# Patient Record
Sex: Female | Born: 1950
Health system: Southern US, Community
[De-identification: ages and names within clinical notes are randomized; demographics above are authoritative.]

## PROBLEM LIST (undated history)

## (undated) DIAGNOSIS — K5090 Crohn's disease, unspecified, without complications: Secondary | ICD-10-CM

## (undated) DIAGNOSIS — Z87442 Personal history of urinary calculi: Secondary | ICD-10-CM

## (undated) DIAGNOSIS — C801 Malignant (primary) neoplasm, unspecified: Secondary | ICD-10-CM

## (undated) DIAGNOSIS — R0683 Snoring: Secondary | ICD-10-CM

## (undated) DIAGNOSIS — M1990 Unspecified osteoarthritis, unspecified site: Secondary | ICD-10-CM

## (undated) DIAGNOSIS — K219 Gastro-esophageal reflux disease without esophagitis: Secondary | ICD-10-CM

## (undated) DIAGNOSIS — N951 Menopausal and female climacteric states: Secondary | ICD-10-CM

## (undated) DIAGNOSIS — F32A Depression, unspecified: Secondary | ICD-10-CM

## (undated) DIAGNOSIS — D649 Anemia, unspecified: Secondary | ICD-10-CM

## (undated) DIAGNOSIS — I10 Essential (primary) hypertension: Secondary | ICD-10-CM

## (undated) DIAGNOSIS — R6889 Other general symptoms and signs: Secondary | ICD-10-CM

## (undated) DIAGNOSIS — N200 Calculus of kidney: Secondary | ICD-10-CM

## (undated) DIAGNOSIS — Z9189 Other specified personal risk factors, not elsewhere classified: Secondary | ICD-10-CM

## (undated) DIAGNOSIS — F329 Major depressive disorder, single episode, unspecified: Secondary | ICD-10-CM

## (undated) DIAGNOSIS — K56609 Unspecified intestinal obstruction, unspecified as to partial versus complete obstruction: Secondary | ICD-10-CM

## (undated) DIAGNOSIS — E785 Hyperlipidemia, unspecified: Secondary | ICD-10-CM

## (undated) DIAGNOSIS — E119 Type 2 diabetes mellitus without complications: Secondary | ICD-10-CM

## (undated) HISTORY — PX: NASAL SINUS SURGERY: SHX719

## (undated) HISTORY — DX: Major depressive disorder, single episode, unspecified: F32.9

## (undated) HISTORY — DX: Unspecified intestinal obstruction, unspecified as to partial versus complete obstruction: K56.609

## (undated) HISTORY — DX: Other specified personal risk factors, not elsewhere classified: Z91.89

## (undated) HISTORY — PX: BREAST BIOPSY: SHX20

## (undated) HISTORY — PX: BREAST SURGERY: SHX581

## (undated) HISTORY — DX: Depression, unspecified: F32.A

## (undated) HISTORY — PX: KNEE SURGERY: SHX244

## (undated) HISTORY — PX: OTHER SURGICAL HISTORY: SHX169

## (undated) HISTORY — DX: Menopausal and female climacteric states: N95.1

## (undated) HISTORY — DX: Hyperlipidemia, unspecified: E78.5

## (undated) HISTORY — DX: Snoring: R06.83

## (undated) HISTORY — DX: Calculus of kidney: N20.0

## (undated) HISTORY — DX: Other general symptoms and signs: R68.89

## (undated) HISTORY — PX: APPENDECTOMY: SHX54

## (undated) HISTORY — DX: Crohn's disease, unspecified, without complications: K50.90

## (undated) HISTORY — PX: BOWEL RESECTION: SHX1257

## (undated) HISTORY — PX: TONGUE BASE REDUCTION SOMNOPLASTY: SHX2535

## (undated) HISTORY — PX: HAND SURGERY: SHX662

## (undated) HISTORY — PX: KIDNEY SURGERY: SHX687

## (undated) HISTORY — DX: Type 2 diabetes mellitus without complications: E11.9

## (undated) HISTORY — PX: DILATION AND CURETTAGE OF UTERUS: SHX78

## (undated) HISTORY — PX: CERVICAL FUSION: SHX112

## (undated) HISTORY — PX: TONSILLECTOMY AND ADENOIDECTOMY: SUR1326

---
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
## 1968-12-12 HISTORY — PX: APPENDECTOMY: SHX54

## 1969-12-12 HISTORY — PX: ILEOCECETOMY: SHX5857

## 1972-12-12 HISTORY — PX: WISDOM TOOTH EXTRACTION: SHX21

## 1982-12-12 HISTORY — PX: DILATION AND CURETTAGE OF UTERUS: SHX78

## 1983-12-13 HISTORY — PX: SMALL INTESTINE SURGERY: SHX150

## 1990-12-12 HISTORY — PX: KIDNEY SURGERY: SHX687

## 1993-12-12 HISTORY — PX: NASAL SINUS SURGERY: SHX719

## 1994-12-12 HISTORY — PX: SMALL INTESTINE SURGERY: SHX150

## 1996-12-12 HISTORY — PX: KNEE SURGERY: SHX244

## 1998-12-12 HISTORY — PX: TONGUE BASE REDUCTION SOMNOPLASTY: SHX2535

## 1999-01-20 ENCOUNTER — Ambulatory Visit (HOSPITAL_COMMUNITY): Admission: RE | Admit: 1999-01-20 | Discharge: 1999-01-20 | Payer: Self-pay | Admitting: Gastroenterology

## 1999-02-15 ENCOUNTER — Ambulatory Visit (HOSPITAL_COMMUNITY): Admission: RE | Admit: 1999-02-15 | Discharge: 1999-02-15 | Payer: Self-pay | Admitting: Gastroenterology

## 1999-02-15 ENCOUNTER — Encounter: Payer: Self-pay | Admitting: Gastroenterology

## 2000-02-10 ENCOUNTER — Other Ambulatory Visit: Admission: RE | Admit: 2000-02-10 | Discharge: 2000-02-10 | Payer: Self-pay | Admitting: Obstetrics and Gynecology

## 2000-06-30 ENCOUNTER — Ambulatory Visit (HOSPITAL_COMMUNITY): Admission: RE | Admit: 2000-06-30 | Discharge: 2000-06-30 | Payer: Self-pay | Admitting: General Surgery

## 2000-06-30 ENCOUNTER — Encounter (INDEPENDENT_AMBULATORY_CARE_PROVIDER_SITE_OTHER): Payer: Self-pay | Admitting: Specialist

## 2000-10-04 ENCOUNTER — Ambulatory Visit (HOSPITAL_COMMUNITY): Admission: RE | Admit: 2000-10-04 | Discharge: 2000-10-04 | Payer: Self-pay | Admitting: Gastroenterology

## 2000-10-04 ENCOUNTER — Encounter (INDEPENDENT_AMBULATORY_CARE_PROVIDER_SITE_OTHER): Payer: Self-pay | Admitting: Specialist

## 2001-03-14 ENCOUNTER — Other Ambulatory Visit: Admission: RE | Admit: 2001-03-14 | Discharge: 2001-03-14 | Payer: Self-pay | Admitting: Obstetrics and Gynecology

## 2002-03-25 ENCOUNTER — Other Ambulatory Visit: Admission: RE | Admit: 2002-03-25 | Discharge: 2002-03-25 | Payer: Self-pay | Admitting: Obstetrics and Gynecology

## 2002-12-12 HISTORY — PX: LIPOSUCTION: SHX10

## 2003-04-23 ENCOUNTER — Other Ambulatory Visit: Admission: RE | Admit: 2003-04-23 | Discharge: 2003-04-23 | Payer: Self-pay | Admitting: Obstetrics and Gynecology

## 2004-05-17 ENCOUNTER — Other Ambulatory Visit: Admission: RE | Admit: 2004-05-17 | Discharge: 2004-05-17 | Payer: Self-pay | Admitting: Obstetrics and Gynecology

## 2005-02-17 ENCOUNTER — Encounter: Admission: RE | Admit: 2005-02-17 | Discharge: 2005-02-17 | Payer: Self-pay | Admitting: Obstetrics and Gynecology

## 2005-07-19 ENCOUNTER — Other Ambulatory Visit: Admission: RE | Admit: 2005-07-19 | Discharge: 2005-07-19 | Payer: Self-pay | Admitting: Obstetrics and Gynecology

## 2005-07-21 ENCOUNTER — Encounter: Admission: RE | Admit: 2005-07-21 | Discharge: 2005-07-21 | Payer: Self-pay | Admitting: Obstetrics and Gynecology

## 2005-10-04 ENCOUNTER — Ambulatory Visit (HOSPITAL_COMMUNITY): Admission: RE | Admit: 2005-10-04 | Discharge: 2005-10-04 | Payer: Self-pay | Admitting: Gastroenterology

## 2006-01-30 ENCOUNTER — Ambulatory Visit (HOSPITAL_COMMUNITY): Admission: RE | Admit: 2006-01-30 | Discharge: 2006-01-30 | Payer: Self-pay | Admitting: Dermatology

## 2006-07-14 ENCOUNTER — Encounter: Admission: RE | Admit: 2006-07-14 | Discharge: 2006-07-14 | Payer: Self-pay | Admitting: Obstetrics and Gynecology

## 2006-08-04 ENCOUNTER — Encounter: Admission: RE | Admit: 2006-08-04 | Discharge: 2006-08-04 | Payer: Self-pay | Admitting: Obstetrics and Gynecology

## 2006-08-24 ENCOUNTER — Ambulatory Visit (HOSPITAL_BASED_OUTPATIENT_CLINIC_OR_DEPARTMENT_OTHER): Admission: RE | Admit: 2006-08-24 | Discharge: 2006-08-24 | Payer: Self-pay | Admitting: Surgery

## 2006-08-24 ENCOUNTER — Encounter (INDEPENDENT_AMBULATORY_CARE_PROVIDER_SITE_OTHER): Payer: Self-pay | Admitting: *Deleted

## 2006-08-31 ENCOUNTER — Other Ambulatory Visit: Admission: RE | Admit: 2006-08-31 | Discharge: 2006-08-31 | Payer: Self-pay | Admitting: Obstetrics and Gynecology

## 2006-10-06 ENCOUNTER — Encounter: Admission: RE | Admit: 2006-10-06 | Discharge: 2006-10-06 | Payer: Self-pay | Admitting: Obstetrics and Gynecology

## 2006-10-13 ENCOUNTER — Encounter: Admission: RE | Admit: 2006-10-13 | Discharge: 2006-10-13 | Payer: Self-pay | Admitting: Obstetrics and Gynecology

## 2007-12-07 ENCOUNTER — Other Ambulatory Visit: Admission: RE | Admit: 2007-12-07 | Discharge: 2007-12-07 | Payer: Self-pay | Admitting: Obstetrics and Gynecology

## 2007-12-19 ENCOUNTER — Encounter: Admission: RE | Admit: 2007-12-19 | Discharge: 2007-12-19 | Payer: Self-pay | Admitting: Obstetrics and Gynecology

## 2009-01-02 ENCOUNTER — Encounter: Admission: RE | Admit: 2009-01-02 | Discharge: 2009-01-02 | Payer: Self-pay | Admitting: Obstetrics and Gynecology

## 2009-01-08 ENCOUNTER — Ambulatory Visit: Payer: Self-pay | Admitting: Obstetrics and Gynecology

## 2009-01-08 ENCOUNTER — Other Ambulatory Visit: Admission: RE | Admit: 2009-01-08 | Discharge: 2009-01-08 | Payer: Self-pay | Admitting: Obstetrics and Gynecology

## 2009-01-08 ENCOUNTER — Encounter: Payer: Self-pay | Admitting: Obstetrics and Gynecology

## 2009-05-12 HISTORY — PX: CERVICAL FUSION: SHX112

## 2009-05-14 ENCOUNTER — Ambulatory Visit (HOSPITAL_COMMUNITY): Admission: RE | Admit: 2009-05-14 | Discharge: 2009-05-15 | Payer: Self-pay | Admitting: Neurosurgery

## 2009-06-18 ENCOUNTER — Encounter: Admission: RE | Admit: 2009-06-18 | Discharge: 2009-06-18 | Payer: Self-pay | Admitting: Internal Medicine

## 2010-01-04 ENCOUNTER — Encounter: Admission: RE | Admit: 2010-01-04 | Discharge: 2010-01-04 | Payer: Self-pay | Admitting: Obstetrics and Gynecology

## 2010-01-08 ENCOUNTER — Encounter: Admission: RE | Admit: 2010-01-08 | Discharge: 2010-01-08 | Payer: Self-pay | Admitting: Obstetrics and Gynecology

## 2010-01-11 ENCOUNTER — Ambulatory Visit: Payer: Self-pay | Admitting: Obstetrics and Gynecology

## 2010-01-11 ENCOUNTER — Other Ambulatory Visit: Admission: RE | Admit: 2010-01-11 | Discharge: 2010-01-11 | Payer: Self-pay | Admitting: Obstetrics and Gynecology

## 2010-03-02 ENCOUNTER — Encounter: Admission: RE | Admit: 2010-03-02 | Discharge: 2010-03-02 | Payer: Self-pay | Admitting: Internal Medicine

## 2010-03-17 ENCOUNTER — Ambulatory Visit: Payer: Self-pay | Admitting: Obstetrics and Gynecology

## 2011-01-02 ENCOUNTER — Encounter (HOSPITAL_BASED_OUTPATIENT_CLINIC_OR_DEPARTMENT_OTHER): Payer: Self-pay | Admitting: Internal Medicine

## 2011-01-02 ENCOUNTER — Encounter: Payer: Self-pay | Admitting: Obstetrics and Gynecology

## 2011-01-12 ENCOUNTER — Other Ambulatory Visit: Payer: Self-pay | Admitting: Obstetrics and Gynecology

## 2011-01-12 DIAGNOSIS — Z1231 Encounter for screening mammogram for malignant neoplasm of breast: Secondary | ICD-10-CM

## 2011-01-12 DIAGNOSIS — Z1239 Encounter for other screening for malignant neoplasm of breast: Secondary | ICD-10-CM

## 2011-01-21 ENCOUNTER — Ambulatory Visit
Admission: RE | Admit: 2011-01-21 | Discharge: 2011-01-21 | Disposition: A | Discharge status: Home / Self Care | Payer: BC Managed Care – PPO | Source: Ambulatory Visit | Attending: Obstetrics and Gynecology | Admitting: Obstetrics and Gynecology

## 2011-01-21 DIAGNOSIS — Z1231 Encounter for screening mammogram for malignant neoplasm of breast: Secondary | ICD-10-CM

## 2011-02-10 ENCOUNTER — Other Ambulatory Visit: Payer: Self-pay | Admitting: Obstetrics and Gynecology

## 2011-02-10 ENCOUNTER — Encounter (INDEPENDENT_AMBULATORY_CARE_PROVIDER_SITE_OTHER): Payer: BC Managed Care – PPO | Admitting: Obstetrics and Gynecology

## 2011-02-10 ENCOUNTER — Other Ambulatory Visit (HOSPITAL_COMMUNITY)
Admission: RE | Admit: 2011-02-10 | Discharge: 2011-02-10 | Disposition: A | Discharge status: Home / Self Care | Payer: BC Managed Care – PPO | Source: Ambulatory Visit | Attending: Obstetrics and Gynecology | Admitting: Obstetrics and Gynecology

## 2011-02-10 DIAGNOSIS — Z01419 Encounter for gynecological examination (general) (routine) without abnormal findings: Secondary | ICD-10-CM

## 2011-02-10 DIAGNOSIS — Z124 Encounter for screening for malignant neoplasm of cervix: Secondary | ICD-10-CM | POA: Insufficient documentation

## 2011-03-22 LAB — BASIC METABOLIC PANEL
BUN: 9 mg/dL (ref 6–23)
CO2: 31 mEq/L (ref 19–32)
Calcium: 9.3 mg/dL (ref 8.4–10.5)
Chloride: 99 mEq/L (ref 96–112)
Creatinine, Ser: 1.1 mg/dL (ref 0.4–1.2)
GFR calc Af Amer: >60 mL/min (ref >60)
GFR calc non Af Amer: 51 mL/min — ABNORMAL LOW (ref >60)
Glucose, Bld: 117 mg/dL — ABNORMAL HIGH (ref 70–99)
Potassium: 3.6 mEq/L (ref 3.5–5.1)
Sodium: 139 mEq/L (ref 135–145)

## 2011-03-22 LAB — CBC
HCT: 40 % (ref 36.0–46.0)
Hemoglobin: 13.9 g/dL (ref 12.0–15.0)
MCHC: 34.6 g/dL (ref 30.0–36.0)
MCV: 88.6 fL (ref 78.0–100.0)
Platelets: 343 10*3/uL (ref 150–400)
RBC: 4.52 MIL/uL (ref 3.87–5.11)
RDW: 13.9 % (ref 11.5–15.5)
WBC: 10.9 10*3/uL — ABNORMAL HIGH (ref 4.0–10.5)

## 2011-04-26 NOTE — Op Note (Signed)
NAMEKAIRA, STRINGFIELD NO.:  [id number]   MEDICAL RECORD NO.:  [id number]          PATIENT TYPE:  OIB   LOCATION:  4696                         FACILITY:  [facility]   PHYSICIAN:  Hosie Spangle, M.D.DATE OF BIRTH:  12-26-1950   DATE OF PROCEDURE:  05/14/2009  DATE OF DISCHARGE:                               OPERATIVE REPORT   PREOPERATIVE DIAGNOSES:  1. Cervical spondylosis.  2. Cervical degenerative disk disease.  3. Cervical disk herniation.  4. Cervical radiculopathy.   POSTOPERATIVE DIAGNOSES:  1. Cervical spondylosis.  2. Cervical degenerative disk disease.  3. Cervical disk herniation.  4. Cervical radiculopathy.   PROCEDURE:  C4-5, C5-6, and C6-7 anterior cervical decompression and  arthrodesis with allograft and Tether cervical plating.   SURGEON:  Hosie Spangle, M.D.   ASSISTANT:  Isa Rankin. Judeth Horn, RN and Elizabeth Sauer, M.D.   ANESTHESIA:  General endotracheal.   INDICATIONS:  The patient with right cervical radicular pain.  She has  advanced degenerative disk disease and spondylosis at C4-5, C5-6, and C6-  7 with osteophytic overgrowth and spondylitic disk protrusion.  A  decision was made to proceed with 3-level decompression and arthrodesis.   PROCEDURE IN DETAIL:  The patient was brought to the operating room and  placed under general endotracheal anesthesia.  The patient was placed in  10 pounds of Halter traction.  Neck was prepped with Betadine soap and  solution and draped in a sterile fashion.  An oblique incision was made  in the left side of the neck.  The line of the incision was infiltrated  with local anesthetic with epinephrine.  Incision was made at the  anterior border of the left sternocleidomastoid and carried down through  the subcutaneous tissue and platysma.  Bipolar cautery was used to  maintain hemostasis.  Dissection was then carried out through an  avascular plane to the ventral aspect of the vertebral column and  localized x-ray taken and the C4-5, C5-6, and C6-7 intervertebral disk  space was identified.  Diskectomy was begun at each level with incision  of the annulus and continued with microcurettes and pituitary rongeurs.  Anterior osteophytic overgrowth was carefully removed.  We entered into  each of the disk space and continued the diskectomy using a variety of  microcurettes and pituitary rongeurs.  The operative microscope was  draped and brought into the field to provide additional magnification,  illumination, and visualization.  The remaining of decompression was  performed using microdissection and microsurgical technique.  The  degenerated endplates were removed using microcurettes along with the  Stryker high speed drill and then posterior osteophytic overgrowth was  removed using the Stryker high speed drill along with a 2-mm Kerrison  punch with thin footplate.  Posterior longitudinal ligament was  carefully removed at each level and decompression was completed within  the spinal canal decompressing the thecal sac at each level and then we  carried our dissection bilaterally to the neural foramina, revealing  spondylitic overgrowth and spondylotic disk herniation, and  decompressing the exiting neural foramina and exiting nerve roots.  Once  decompression  was completed bilaterally, hemostasis was established with  the use of Gelfoam soaked in thrombin.  We measured the height of the  intervertebral disk space and selected three 6 mm allograft implant.  Each was hydrated in saline solution, positioned in the intervertebral  disk space and countersunk.  We then selected a 46-mm Tether cervical  plate, it was positioned over the fusion construct and secured to the  vertebra with 4 x 13 mm variable angle screws.  Each screw hole was  drilled and tapped, and the screws placed in alternating fashion.  Once  all 6 screws were in place, final tightening was performed.  The wound  was  irrigated numerous times of the procedure with Bacitracin solution.  At the end, we once again irrigated and checked for hemostasis which was  established and confirmed and then we proceeded with closure.  We did  take an x-ray which showed the graft, plate and screws in good position  and the alignment to be good.  The platysma was closed with interrupted  and inverted 2-0 undyed Vicryl sutures.  The subcutaneous and  subcuticular layer were closed with interrupted  inverted 3-0 undyed  Vicryl sutures.  The skin was reapproximated with Dermabond.  The  procedure was tolerated well.  The estimated blood loss was 100 mL.  Sponge and needle count were correct.  Following surgery, the patient  was placed in a soft cervical collar to be reversed from the anesthetic,  extubated, and transferred to the recovery room for further care.      Hosie Spangle, M.D.  Electronically Signed     RWN/MEDQ  D:  05/14/2009  T:  05/14/2009  Job:  [id number]

## 2011-04-29 NOTE — Procedures (Signed)
[facility]  Patient:    Alexis Ramos, Alexis Ramos                     MRN: [id number] Proc. Date: 10/04/00 Adm. Date:  [id number] Attending:  Ernie Avena CC:         Ermalene Searing. Philip Aspen, M.D.  Thomas B. March Rummage, M.D.   Procedure Report  PROCEDURE:  Colonoscopy with biopsies.  SURGEON:  Cleotis Nipper, M.D.  INDICATIONS:  The patient is a 60 year old female with longstanding Crohns disease, status post multiple terminal ileal resections, who now about a year-and-a-half status post her most recent colonoscopy, at which time, significant inflammatory changes were identified at the ileal colonic anastomosis.  This procedure is being done prior to consideration of second line therapy such as Imuran.  FINDINGS:  Fairly severe inflammatory changes at the ileal colonic anastomosis as previously noted.  DESCRIPTION OF PROCEDURE:  The nature, purpose, and risks of the procedure were familiar to the procedure who provided written consent.  Sedation was fentanyl 87.5 mcg and Versed 7 mg IV prior to and during the course of the procedure without arrhythmias or desaturation.  The Olympus pediatric video colonoscope was advanced fairly easily around the colon to the ileal colonic anastomosis.  At that point, there was a great deal of mucosal edema as well as fairly deep ulcerations and some degree of exudate and friability.  It was not possible to identify the lumen to enter the small bowel, despite probing around with the tip of the scope quite a bit.  Biopsies were obtained from this area prior to initiation of pull back.  The colon was essentially free of any inflammatory changes.  I did see one area where there was a very small 2-3 mm area of exudate with erythema, possibly representing a focal colonic ulceration, but when enlarged, the colonic mucosa looked normal except in the rectum where there was loss of vascularity, and although no  active inflammatory changes such as ulcerations are present, there appeared to be some deformity of the mucosa consistent with previous ulceration.  There were no nodules, no friability, and no exudate.  Pull out through the anal canal showed some minimal focal ulceration and a tiny bit of deformity, but there was no frank anal stenosis.  The patient tolerated the procedure well and there were no apparent complications.  IMPRESSION:  Appearance essentially unchanged from a year-and-a-half ago with significant ileal colonic anastomotic inflammation, despite switching from sulfasalazine to Asacol in recent months.  PLAN:  Await pathology on biopsies.  Office follow up in the near future with consideration of initiation of Imuran therapy at that time. DD:  10/04/00 TD:  10/04/00 Job: [id number]

## 2011-04-29 NOTE — Op Note (Signed)
[facility]  Patient:    Alexis Ramos, ORF                     MRN: [id number] Proc. Date: 06/30/00 Adm. Date:  [id number] Disc. Date: [id number] Attending:  Maxwell Marion                           Operative Report  PREOPERATIVE DIAGNOSIS:  Crohns disease, probable perirectal abscess.  POSTOPERATIVE DIAGNOSIS:  Anal fissures, multiple.  OPERATIVE PROCEDURE:  Examination under anesthesia, biopsy, perianal scan, and cauterization of fissures.  SURGEON:  Timothy E. Rosana Hoes, M.D.  ASSISTANT:  Abbott Pao. March Rummage, M.D.  ANESTHESIA:  CRNA supervised M.D.  INDICATIONS:  The patient is a long-term patient of Dr. Aaron Edelman who has been seen and followed intermittently for Crohns disease, and perianal sepsis, as well as anal fissures.  She has been treated conservative for the most part, but has had several operative procedures, none in the recent times. At present, she is thought to have stable Crohns disease though she does have partial stricture of the distal ileum with two prior resections.  She is maximum drug therapy and for the past two months has had perianal pain, swelling, and pustular drainage.  Because of anal tenderness, we have not been able to completely exam her in the office.  Also, because of her Crohns disease and prior surgeries, we area very anxious not to do any more surgery that is actually necessary and she agrees.  DESCRIPTION OF PROCEDURE:  The patient was brought to the operating room and placed supine and general LMA anesthesia provided.   She was placed in lithotomy.  The perianal area was prepped and draped in the usual fashion. Gentle digital palpation of the anal canal revealed what I thought was a posterior anal fissure and a right anterior fissure.  I did not palpate any evidence of abscess and upon palpation expressed no pustular material.  There was one firm area in the perianal skin just to the right of the  posterior midline, and I did make biopsies of this area by removing small full thickness biopsies.  These were submitted to pathology.  There was no evidence of abscessed cavity, pustular discharge, or fistula in this area.  The ___ was cauterized.  We again explored the entire anal rectum with the examining anoscope, as well as bimanual finger examination, and found no evidence of sepsis, and by original agreement, we did not do any further operative procedure.  After the fissures were cauterized lightly, jet lavage was placed in the anal canal.  A dry sterile dressing was applied.  She tolerated it well and was removed to the recovery room in good condition.  Written and verbal instructions are given to the patient including Vicodin, and she will be seen and followed as an outpatient. DD:  06/30/00 TD:  07/04/00 Job: [id number]

## 2011-04-29 NOTE — Op Note (Signed)
Alexis Ramos, Alexis Ramos              ACCOUNT NO.:  [id number]   MEDICAL RECORD NO.:  [id number]          PATIENT TYPE:  AMB   LOCATION:  [place]                          FACILITY:  [facility]   PHYSICIAN:  Fenton Malling. Lucia Gaskins, M.D.  DATE OF BIRTH:  March 02, 1951   DATE OF PROCEDURE:  08/24/2006  DATE OF DISCHARGE:                                 OPERATIVE REPORT   PREOPERATIVE DIAGNOSIS:  Persistent right nipple discharge with negative  mammogram.   POSTOPERATIVE DIAGNOSIS:  Persistent right nipple discharge with trigger  point at the 4 to 5-o'clock position.   PROCEDURE:  Right breast excisional biopsy.   SURGEON:  Fenton Malling. Lucia Gaskins, M.D.   FIRST ASSISTANT:  None.   ANESTHESIA:  MAC, with approximately 40 cc of local anesthetic.   COMPLICATIONS:  None.   INDICATIONS FOR PROCEDURE:  [name] is a pleasant 60 year old white  female, who is followed by Dr. Valetta Fuller from her medical standpoint.  She has had a persistent right nipple discharge that has been going on for  several months and is actually staining her clothes.  She had a mammogram in  October, which showed no specific mass or lesion; however, an attempted  ductogram was unsuccessful.  The patient now comes for excisional breast  biopsy.  The plan is excisional biopsy.  The indications and potential  complications were explained to the patient.  The potential complications  included, but were not limited to bleeding, infection, recurrent nipple  discharge.   OPERATIVE NOTE:  With the patient in supine position, given a MAC  anesthesia, her right breast was prepped by anesthesia and sterilely draped.  Trigger point for this discharge appeared to be at the 4-o'clock position of  the right breast, and she had sort of a brownish fluid that was discharged  out.   I marked the edge of her areola.  I infiltrated the skin with about 40 cc of  a mixture of 1% Xylocaine with 0.25% Marcaine with Neut.   I then made an incision.  I cut  out a block of tissue approximately 4 x 4  cm, and this included the duct that had the discharge.   I then irrigated the wound and controlled the bleeding with Bovie  electrocautery.  I closed the subcutaneous tissues with 3-0 Vicryl sutures,  and the skin with a 5-0 Vicryl suture.  I painted the wound with tincture of  benzoin and steri-stripped it.   The patient tolerated the procedure well and was transported to the recovery  room in good condition.  Sponge and needle counts were correct at the end of  the case.      Fenton Malling. Lucia Gaskins, M.D.  Electronically Signed     DHN/MEDQ  D:  08/24/2006  T:  08/24/2006  Job:  [id number]   cc:   [name], M.D.  Ermalene Searing Philip Aspen, M.D.  Daniel L. Cherylann Banas, M.D.

## 2011-06-26 DIAGNOSIS — K56609 Unspecified intestinal obstruction, unspecified as to partial versus complete obstruction: Secondary | ICD-10-CM

## 2011-06-26 DIAGNOSIS — K5090 Crohn's disease, unspecified, without complications: Secondary | ICD-10-CM

## 2011-06-27 ENCOUNTER — Encounter (HOSPITAL_COMMUNITY): Payer: Self-pay | Admitting: Radiology

## 2011-06-27 ENCOUNTER — Emergency Department (HOSPITAL_COMMUNITY): Payer: BC Managed Care – PPO

## 2011-06-27 ENCOUNTER — Inpatient Hospital Stay (HOSPITAL_COMMUNITY)
Admission: EM | Admit: 2011-06-27 | Discharge: 2011-07-01 | DRG: 180 | Disposition: A | Discharge status: Home / Self Care | Payer: BC Managed Care – PPO | Attending: Gastroenterology | Admitting: Gastroenterology

## 2011-06-27 DIAGNOSIS — Z87442 Personal history of urinary calculi: Secondary | ICD-10-CM

## 2011-06-27 DIAGNOSIS — M5030 Other cervical disc degeneration, unspecified cervical region: Secondary | ICD-10-CM | POA: Diagnosis present

## 2011-06-27 DIAGNOSIS — Z9049 Acquired absence of other specified parts of digestive tract: Secondary | ICD-10-CM

## 2011-06-27 DIAGNOSIS — K5080 Crohn's disease of both small and large intestine without complications: Secondary | ICD-10-CM | POA: Diagnosis present

## 2011-06-27 DIAGNOSIS — E119 Type 2 diabetes mellitus without complications: Secondary | ICD-10-CM | POA: Diagnosis present

## 2011-06-27 DIAGNOSIS — I10 Essential (primary) hypertension: Secondary | ICD-10-CM | POA: Diagnosis present

## 2011-06-27 DIAGNOSIS — K5669 Other intestinal obstruction: Principal | ICD-10-CM | POA: Diagnosis present

## 2011-06-27 DIAGNOSIS — E876 Hypokalemia: Secondary | ICD-10-CM | POA: Diagnosis not present

## 2011-06-27 DIAGNOSIS — T380X5A Adverse effect of glucocorticoids and synthetic analogues, initial encounter: Secondary | ICD-10-CM | POA: Diagnosis not present

## 2011-06-27 DIAGNOSIS — D72829 Elevated white blood cell count, unspecified: Secondary | ICD-10-CM | POA: Diagnosis not present

## 2011-06-27 DIAGNOSIS — E785 Hyperlipidemia, unspecified: Secondary | ICD-10-CM | POA: Diagnosis present

## 2011-06-27 DIAGNOSIS — R109 Unspecified abdominal pain: Secondary | ICD-10-CM | POA: Diagnosis present

## 2011-06-27 HISTORY — DX: Essential (primary) hypertension: I10

## 2011-06-27 LAB — URINALYSIS, ROUTINE W REFLEX MICROSCOPIC
Nitrite: NEGATIVE
Protein, ur: NEGATIVE mg/dL
Specific Gravity, Urine: 1.01 (ref 1.005–1.030)
Urobilinogen, UA: 0.2 mg/dL (ref 0.0–1.0)

## 2011-06-27 LAB — COMPREHENSIVE METABOLIC PANEL
BUN: 16 mg/dL (ref 6–23)
CO2: 27 mEq/L (ref 19–32)
Calcium: 11.2 mg/dL — ABNORMAL HIGH (ref 8.4–10.5)
Creatinine, Ser: 0.97 mg/dL (ref 0.50–1.10)
GFR calc Af Amer: >60 mL/min (ref >60)
GFR calc non Af Amer: 58 mL/min — ABNORMAL LOW (ref >60)
Glucose, Bld: 152 mg/dL — ABNORMAL HIGH (ref 70–99)
Sodium: 133 mEq/L — ABNORMAL LOW (ref 135–145)
Total Protein: 8 g/dL (ref 6.0–8.3)

## 2011-06-27 LAB — LIPASE, BLOOD: Lipase: 15 U/L (ref 11–59)

## 2011-06-27 LAB — DIFFERENTIAL
Basophils Relative: 0 % (ref 0–1)
Eosinophils Absolute: 0 10*3/uL (ref 0.0–0.7)
Eosinophils Relative: 0 % (ref 0–5)

## 2011-06-27 LAB — CBC
HCT: 45.4 % (ref 36.0–46.0)
Hemoglobin: 15.1 g/dL — ABNORMAL HIGH (ref 12.0–15.0)
MCHC: 33.3 g/dL (ref 30.0–36.0)
MCV: 88.8 fL (ref 78.0–100.0)
RDW: 13.3 % (ref 11.5–15.5)
WBC: 18.7 10*3/uL — ABNORMAL HIGH (ref 4.0–10.5)

## 2011-06-27 MED ORDER — IOHEXOL 300 MG/ML  SOLN
100.0000 mL | Freq: Once | INTRAMUSCULAR | Status: AC | PRN
  Administered 2011-06-27: 100 mL via INTRAVENOUS

## 2011-06-28 ENCOUNTER — Inpatient Hospital Stay (HOSPITAL_COMMUNITY): Payer: BC Managed Care – PPO

## 2011-06-28 LAB — GLUCOSE, CAPILLARY
Glucose-Capillary: 137 mg/dL — ABNORMAL HIGH (ref 70–99)
Glucose-Capillary: 147 mg/dL — ABNORMAL HIGH (ref 70–99)
Glucose-Capillary: 162 mg/dL — ABNORMAL HIGH (ref 70–99)

## 2011-06-28 LAB — COMPREHENSIVE METABOLIC PANEL
Albumin: 3.1 g/dL — ABNORMAL LOW (ref 3.5–5.2)
Alkaline Phosphatase: 62 U/L (ref 39–117)
BUN: 16 mg/dL (ref 6–23)
Calcium: 8.9 mg/dL (ref 8.4–10.5)
GFR calc Af Amer: >60 mL/min (ref >60)
Glucose, Bld: 144 mg/dL — ABNORMAL HIGH (ref 70–99)
Potassium: 3.8 mEq/L (ref 3.5–5.1)
Sodium: 141 mEq/L (ref 135–145)
Total Protein: 6.5 g/dL (ref 6.0–8.3)

## 2011-06-28 LAB — URINE CULTURE: Culture  Setup Time: 201207161456

## 2011-06-28 LAB — CBC
HCT: 37.4 % (ref 36.0–46.0)
Hemoglobin: 12.3 g/dL (ref 12.0–15.0)
MCH: 29.6 pg (ref 26.0–34.0)
MCHC: 32.9 g/dL (ref 30.0–36.0)
RDW: 13.5 % (ref 11.5–15.5)

## 2011-06-29 LAB — BASIC METABOLIC PANEL
BUN: 15 mg/dL (ref 6–23)
Calcium: 8.4 mg/dL (ref 8.4–10.5)
Chloride: 103 mEq/L (ref 96–112)
Creatinine, Ser: 1.18 mg/dL — ABNORMAL HIGH (ref 0.50–1.10)
GFR calc Af Amer: 56 mL/min — ABNORMAL LOW (ref >60)

## 2011-06-29 LAB — CBC
HCT: 32.9 % — ABNORMAL LOW (ref 36.0–46.0)
MCH: 29.1 pg (ref 26.0–34.0)
MCHC: 31.6 g/dL (ref 30.0–36.0)
MCV: 92.2 fL (ref 78.0–100.0)
Platelets: 221 10*3/uL (ref 150–400)
RDW: 13.9 % (ref 11.5–15.5)

## 2011-06-29 LAB — GLUCOSE, CAPILLARY
Glucose-Capillary: 166 mg/dL — ABNORMAL HIGH (ref 70–99)
Glucose-Capillary: 184 mg/dL — ABNORMAL HIGH (ref 70–99)
Glucose-Capillary: 202 mg/dL — ABNORMAL HIGH (ref 70–99)

## 2011-06-29 LAB — PTH, INTACT AND CALCIUM: PTH: 44.9 pg/mL (ref 14.0–72.0)

## 2011-06-30 ENCOUNTER — Inpatient Hospital Stay (HOSPITAL_COMMUNITY): Payer: BC Managed Care – PPO

## 2011-06-30 LAB — GLUCOSE, CAPILLARY
Glucose-Capillary: 129 mg/dL — ABNORMAL HIGH (ref 70–99)
Glucose-Capillary: 147 mg/dL — ABNORMAL HIGH (ref 70–99)

## 2011-06-30 LAB — BASIC METABOLIC PANEL
BUN: 16 mg/dL (ref 6–23)
Creatinine, Ser: 1.09 mg/dL (ref 0.50–1.10)
GFR calc Af Amer: >60 mL/min (ref >60)
GFR calc non Af Amer: 51 mL/min — ABNORMAL LOW (ref >60)
Potassium: 3.4 mEq/L — ABNORMAL LOW (ref 3.5–5.1)

## 2011-06-30 LAB — CBC
Hemoglobin: 11.4 g/dL — ABNORMAL LOW (ref 12.0–15.0)
MCHC: 31.3 g/dL (ref 30.0–36.0)
RDW: 13.9 % (ref 11.5–15.5)
WBC: 18.8 10*3/uL — ABNORMAL HIGH (ref 4.0–10.5)

## 2011-06-30 LAB — POTASSIUM: Potassium: 3.2 mEq/L — ABNORMAL LOW (ref 3.5–5.1)

## 2011-07-01 LAB — CBC
MCH: 29.2 pg (ref 26.0–34.0)
MCV: 91.4 fL (ref 78.0–100.0)
Platelets: 219 10*3/uL (ref 150–400)
RDW: 13.6 % (ref 11.5–15.5)
WBC: 17.3 10*3/uL — ABNORMAL HIGH (ref 4.0–10.5)

## 2011-07-01 LAB — DIFFERENTIAL
Basophils Relative: 0 % (ref 0–1)
Eosinophils Absolute: 0.1 10*3/uL (ref 0.0–0.7)
Eosinophils Relative: 0 % (ref 0–5)
Lymphs Abs: 4.3 10*3/uL — ABNORMAL HIGH (ref 0.7–4.0)
Monocytes Relative: 10 % (ref 3–12)

## 2011-07-01 LAB — BASIC METABOLIC PANEL
BUN: 12 mg/dL (ref 6–23)
Calcium: 8.5 mg/dL (ref 8.4–10.5)
Chloride: 103 mEq/L (ref 96–112)
Creatinine, Ser: 1.06 mg/dL (ref 0.50–1.10)
GFR calc Af Amer: >60 mL/min (ref >60)

## 2011-07-04 NOTE — Consult Note (Signed)
NAMEJHOANA, UPHAM NO.:  [id number]  MEDICAL RECORD NO.:  [id number]  LOCATION:  [facility]                         FACILITY:  [facility]  PHYSICIAN:  Marland Kitchen T. Taysom Glymph, M.D.DATE OF BIRTH:  June 23, 1951  DATE OF CONSULTATION:  06/27/2011 DATE OF DISCHARGE:                                CONSULTATION   REQUESTING PHYSICIAN:  Jola Schmidt, M.D. in the Emergency Department.  PRIMARY CARE PHYSICIAN:  Ermalene Searing. Philip Aspen, M.D.  GASTROENTEROLOGIST:  [name], M.D.  GENERAL SURGEON:  Fenton Malling. Lucia Gaskins, M.D.  CONSULTING SURGEON:  Darene Lamer. Ishaan Villamar, M.D.  REASON FOR CONSULTATION:  Small bowel obstruction secondary to Crohn's flare up.  HISTORY OF PRESENT ILLNESS:  Ms. Bora is a 60 year old white female with a history of Crohn disease, hypotension and depression, who has not had any issues with her Crohn's disease in over 10 years.  She actually states she was discharged from Dr. Osborn Coho practice last year secondary to no further issues.  She does have a history of four small- bowel resections secondary to Crohn's disease.  Yesterday, around 1300, the patient developed abdominal pain.  She had a small bowel movement around 1600.  Later in the evening, she began having projectile vomiting.  Since her last bowel movement, she has not had any flatus at all.  She currently complains of increasing abdominal distention and pain.  She called Dr. Osborn Coho office, who informed her to come to the Emergency Department for further evaluation.  Upon arrival, she had a more thorough workup, which included labs and a white blood cell count of 18,700.  She then had a CT scan of the abdomen and pelvis, which revealed a high-grade small bowel obstruction secondary to an acute Crohn's flare.  Secondary to the bowel obstruction, we were asked to evaluate the patient for further recommendations.  REVIEW OF SYSTEMS:  Please see HPI.  Otherwise all other systems have been  reviewed and are negative.  PAST MEDICAL HISTORY: 1. Crohn's disease for 46 years. 2. Hypotension. 3. Depression. 4. Legg-Calve-Perthes disease.  PAST SURGICAL HISTORY: 1. I and D of perianal abscess multiple times. 2. Exploratory laparotomy with small bowel resection for Crohn disease     x3. 3. Exploratory laparotomy with appendectomy at which time she     ultimately had a small bowel resection with the discovery of     Crohn's disease. 4. Sinus surgery. 5. Multiple other sinus surgeries as well as tonsils and     adenoidectomy. 6. Multiple surgeries on her neck. 7. Right breast biopsy. 8. Liposuction of her neck. 9. Surgery on her left hand. 10.Surgery on her left knee. 11.Surgery on her kidney. 12.D and C.  SOCIAL HISTORY:  The patient is single.  She lives by herself.  She denies any alcohol, tobacco or illicit drug abuse.  She is a retired [employer] account planner.  ALLERGIES: 1. ADVIL and ASPIRIN, which caused bleeding of her kidney. 2. PENICILLIN, which causes anaphylaxis.  MEDICATIONS: 1. CombiPatch 0.05/0.14 taken every 2 weeks. 2. Lovaza 3000 mg daily. 3. Effexor 150 mg daily. 4. Benazepril/hydrochlorothiazide 10/12.5 daily. 5. Vitamin B12. 6. Vitamin D3. 7. Glucosamine chondroitin. 8. Pseudoephedrine 90 mg daily.  PHYSICAL EXAMINATION:  GENERAL:  Ms. Romberger is a pleasant 60 year old well-developed, well-nourished white female, who is currently lying in bed, in no acute distress. VITAL SIGNS:  Temperature 97.7, pulse 76, respirations 20, blood pressure 148/82. HEENT:  Head is normocephalic, atraumatic.  Sclerae noninjected.  Pupils are equal, round and reactive to light.  Ears and nose are without any obvious masses or lesions.  No rhinorrhea.  Mouth is pink.  Throat shows no exudate. HEART:  Regular rate and rhythm.  Normal S1, S2.  No murmurs, gallops, or rubs are noted.  She does have palpable carotid and pedal pulses bilaterally. LUNGS:  Clear to auscultation  bilaterally with no wheezes, rhonchi or rales noted.  Respirations are nonlabored. ABDOMEN:  Soft, but somewhat distended.  She otherwise has active bowel sounds and has mild diffuse tenderness.  There is no abdominal guarding or peritoneal signs.  She does have several scars noted from her prior abdominal surgery, but no masses or hernias. MUSCULOSKELETAL:  All four extremities are symmetrical.  No cyanosis, clubbing, or edema. SKIN:  Warm and dry with no masses, lesions, or rashes. PSYCHIATRIC:  The patient is alert and oriented x3 with appropriate affect.  LABORATORY DATA AND DIAGNOSTIC STUDIES:  White blood cell count 18,700, hemoglobin 15.1, hematocrit 45.4, platelet count is 324,000, neutrophil count is 85%.  Sodium 133, potassium 4.3, glucose 132, BUN 16, creatinine 0.97.  Diagnostic CT scan of abdomen and pelvis reveals small bowel obstruction, which is high grade secondary to thickening of the terminal ileum secondary to Crohn's disease.  IMPRESSION: 1. Small bowel obstruction secondary to acute Crohn's flare. 2. Crohn's disease. 3. Hypotension. 4. Depression. 5. Legg-Calve-Perthes disease.PLAN:  At this time, we agree with medical/GI admission.  The patient does appear to have a small bowel obstruction secondary to an active Crohn's flare up.  She will need gastroenterology to see her for recommendations for medicine management.  If for some reason the patient does not improve with conservative medical management then she may need further intervention with surgery.  Currently, we will hold off on placing an NG tube as the patient is without emesis; however, I have explained to her if she develops nausea or vomiting this may need to be placed.  In the meantime, we will repeat her abdominal films in the morning and follow closely with you.  We would defer medicine recommendations to the gastroenterologist.     Henreitta Cea,  PA   ______________________________ Darene Lamer. Patty Lopezgarcia, M.D.    KEO/MEDQ  D:  06/27/2011  T:  06/27/2011  Job:  [id number]  cc:   Ermalene Searing. Philip Aspen, M.D. Fax: [phone]  Jola Schmidt, MD  [name], M.D. Fax: [phone]  Fenton Malling. Lucia Gaskins, M.D. [address], [place] [address]  Electronically Signed by Saverio Danker PA on 07/01/2011 03:46:30 PM Electronically Signed by Excell Seltzer M.D. on 07/04/2011 12:24:31 PM

## 2011-07-14 NOTE — Discharge Summary (Signed)
Alexis Ramos, Alexis Ramos NO.:  [id number]  MEDICAL RECORD NO.:  [id number]  LOCATION:  8527                         FACILITY:  [facility]  PHYSICIAN:  Alexis Ramos, MDDATE OF BIRTH:  [birth year]  DATE OF ADMISSION:  06/27/2011 DATE OF DISCHARGE:  07/01/2011                              DISCHARGE SUMMARY   DISCHARGE DIAGNOSES: 1. Small bowel obstruction (resolved) - secondary to #2. 2. Crohn's ileocolitis 3. Hypertension. 4. Diabetes mellitus type 2.  HISTORY OF PRESENT ILLNESS:  Alexis Ramos has a longstanding history of Crohn disease (41-year history) who has been asymptomatic since 1996 when she last had a small-bowel resection.  She has been off medicines for years and has been asymptomatic up until the day prior to admission when she developed acute sharp right lower quadrant abdominal pain followed 7 hours later by one episode of projectile vomiting.  She also went almost 2 days without having any bowel movements when she normally has loose stool.  She was admitted and found to have a high-grade small bowel obstruction on CT scan.  HOSPITAL COURSE:  Due to this high-grade small bowel obstruction thought to be secondary to active Crohn disease at the neoterminal ileum, she was placed on IV Solu-Medrol and responded well to the steroids in terms of quick resolution of her abdominal pain and no further nausea and vomiting.  The x-ray the day after admission showed resolution of her small bowel obstruction and her diet was advanced and she began passing gas and had one small loose stool on June 28, 2011.  On June 29, 2011, she was tolerating soft food and her diet was advanced further.  Her IV steroids were changed to p.o. prednisone.  Her white blood count did increase during the hospitalization to peak of 18.8 which was thought to be due to her steroids.  On June 30, 2011, she felt like she had bad gas pains and her potassium was low on June 29, 2011, and  June 30, 2011, and that was replaced.  Due to the elevated white count and gas pains, plan was made to discharge on July 01, 2011, if she was doing better.  A chest x-ray was done to follow up on a question of left basilar opacities on abdominal x-ray and chest x-ray was negative.  She was doing well on July 01, 2011, and her potassium was repleted and was at 3.5 from a low of 2.8 on June 29, 2011.  She was stable for discharge home on July 01, 2011.  CONDITION ON DISCHARGE:  Improved.  DISCHARGE ACTIVITIES:  No restrictions.  DISCHARGE DIET:  Low fiber diet.  DISCHARGE MEDICATIONS: 1. Prednisone 40 mg p.o. daily 2. Asacol HD 300 mg tablets 3 tablets p.o. b.i.d. 3. Gas-X as needed. 4. Benazepril/HCTZ 10/12.5 mg p.o. daily 5. CombiPatch transdermally every month. 6. Vitamin B12 injection 1000 mcg intramuscularly every 3 weeks. 7. Glucosamine chondroitin 1 capsule p.o. daily. 8. Lovaza 3 capsules p.o. daily. 9. Venlafaxine 1 tablet p.o. b.i.d. 10.Vitamin D3 5000 one capsule p.o. daily.  DISCHARGE FOLLOWUP: 1. The patient should have a lab visit at Dr. Shon Baton office early     next week and she was  scheduled to have one today as an outpatient,     so she will call and rearrange that. 2. The patient should keep her appointment with Dr. Philip Ramos which is     scheduled for late next week. 3. The patient has been set to see Dr. Cristina Ramos on July 19, 2011, at     4:45 p.m. at [facility] (at that time a decision will need to be made     about how to taper her prednisone).     Alexis Ng, MD     VCS/MEDQ  D:  07/01/2011  T:  07/01/2011  Job:  [id number]  cc:   Alexis Ramos. Alexis Ramos, M.D. Fax: [phone]  Alexis Ramos, M.D. Fax: [phone]  Electronically Signed by Wilford Corner MD on 07/14/2011 12:53:15 PM

## 2011-07-14 NOTE — Consult Note (Signed)
NAMEMarland Kitchen  Alexis, Ramos NO.:  [id number]  MEDICAL RECORD NO.:  [id number]  LOCATION:  WLED                         FACILITY:  [facility]  PHYSICIAN:  Alexis Ramos, MDDATE OF BIRTH:  September 08, 1951  DATE OF CONSULTATION: DATE OF DISCHARGE:                                CONSULTATION   GI Consultation.  REQUESTING PHYSICIAN:  Alexis Ramos, M.D.  INDICATION:  Abdominal pain, small-bowel obstruction, history of Crohn's disease.  HISTORY OF PRESENT ILLNESS:  Ms. Alexis Ramos is a pleasant 60 year old female, who is well known by Dr. Cristina Ramos with a longstanding history of Crohn's disease (41-year history), who was last seen by Dr. Cristina Ramos in 2011 and has been asymptomatic since 1996 per her report.  She reports having a narrow terminal ileum on CT scan 2 years ago without symptoms and she was last seen in 2011 by Dr. Cristina Ramos.  She is not on any Crohn's medicines and denies having any problems since 1996.  She does report chronically loose stools up until the onset of the current symptoms as stated below.  She has had four distal small-bowel resections, last in 1996.  Yesterday, she had acute onset of severe sharp right lower quadrant pain, followed 7 hours later by one episode of projectile vomiting.  She normally has loose stools, but has had no bowel movements at all in 30 hours.  She had no improvement in abdominal pain and vomiting.  A CT scan done in the Emergency Room showed a high-grade small bowel obstruction of terminal ileum (a long segment noted upon my review with radiology).  PAST MEDICAL HISTORY: 1. Crohn's disease as stated above. 2. History of multiple small bowel resections as stated above. 3. History of perianal disease. 4. Hypertension. 5. History of Lyme disease. 6. Legg-Calves-Perthes disease. 7. History of depression.  MEDICATIONS:  Home medicines: 1. CombiPatch. 2. Lovaza. 3. Effexor. 4. Benazepril/HCTZ. 5. Vitamin B12. 6. Vitamin  D3. 7. Glucosamine/chondroitin. 8. Excedrin.  Doses listed in the hospital record.  ALLERGIES: 1. NSAIDS. 2. PENICILLIN.  FAMILY HISTORY:  Noncontributory.  SOCIAL HISTORY:  Denies alcohol, drugs or tobacco.  REVIEW OF SYSTEMS:  Negative from GI standpoint except as stated above.  PHYSICAL EXAMINATION:  VITAL SIGNS:  Temperature 97.7, pulse 76, blood pressure 148/82. GENERAL:  Elderly, alert, in no acute distress, well nourished ABDOMEN:  Right lower quadrant tenderness with guarding, minimal left lower quadrant tenderness, soft, nondistended, positive bowel sounds.  LABORATORY DATA:  White blood count 18.7, hemoglobin 15.1, platelet count 324,000.  Potassium 4.3, BUN 16, creatinine 0.97, lipase 15. Additional labs are noted on the hospital record and were reviewed.  IMPRESSION:  51 white female with longstanding Crohn's disease presenting with a high-grade small bowel obstruction that is inflammatory in origin from her Crohn's disease.  She may have adhesions present as well, but I think the main source for obstruction is from her Crohn's disease.  Agree that there is no role for immediate surgery at this time unless she has no response to medical management.  Agree that a nasogastric tube is not necessary at this time since she is not vomiting.  We recommend aggressive IV steroids, bowel rest, IV fluids and hold off  on antibiotics at this time.  She did receive 125 mg of IV Solu-Medrol in the Emergency Room.     Alexis Ng, MD     VCS/MEDQ  D:  06/27/2011  T:  06/27/2011  Job:  [id number]  cc:   Alexis Ramos, M.D. Fax: [phone]  Electronically Signed by Alexis Corner MD on 07/14/2011 12:56:28 PM

## 2011-07-27 NOTE — H&P (Signed)
NAMEERSILIA, BRAWLEY NO.:  [id number]  MEDICAL RECORD NO.:  [id number]  LOCATION:  1333                         FACILITY:  [facility]  PHYSICIAN:  Barry Dienes. Eloise Harman, M.D.DATE OF BIRTH:  1951-09-18  DATE OF ADMISSION:  06/27/2011 DATE OF DISCHARGE:                             HISTORY & PHYSICAL   CHIEF COMPLAINT:  Nausea, vomiting, and vague abdominal pain.  HISTORY OF PRESENT ILLNESS:  The patient is a 60 year old Caucasian woman who is followed closely by her GI specialist for Crohn disease and has developed worsening symptoms of nausea, vague lower abdominal pain with an episode of vomiting on the night prior to admission.  Her symptoms continued throughout the day with worsening nausea and crampy abdominal pain.  She denied fever or chills.  Her last meal was yesterday his breakfast and all she has had to consume is sips of water and popsicles since yesterday afternoon.  She is familiar with symptoms of Crohn disease exacerbation and presented to the emergency room today for evaluation.  She was started on IV fluids and IV Solu-Medrol and since then has had somewhat decrease nausea and abdominal discomfort and one small normal-appearing bowel movement.  She has not had any bleeding per rectum.  She last had colonoscopy in June 2011 and was felt to have a small terminal ileum but no evidence of active disease, so she is advised that she could stop Asacol at that time with a thought that her Crohn's disease was burned out.  Today in the emergency room she has had several lab studies and a CT scan of the abdomen that is consistent with a small-bowel obstruction. She was seen by a GI consultant who felt that she would be more appropriately managed by an internal medicine physician.  PAST MEDICAL HISTORY:  Crohn's disease with several partial small-bowel obstructions and resection, hypertension, depression, mild stenosis of the ileum, nephrolithiasis, diabetes  mellitus type 2 controlled by diet and exercise, hyperlipidemia, cervical spine degenerative disk disease, snoring status post somnoplasty, Legg-Calve-Perthes disease, history of perirectal abscess, overactive bladder, April 2011 acute low back pain with MRI scan showing left lateral recess stenosis at the L5-S1 level.  PHYSICIANS INVOLVED IN CARE:  Dr. Bernette Redbird, Dr. Ovidio Kin, Dr. Osborn Coho, Dr. Wyn Quaker, Dr. Sherald Hess, Dr. Eda Paschal, Dr. Mina Marble, Dr. Leslee Home and Dr. Orvan Falconer.  PAST SURGICAL HISTORY:  In 1959 tonsillectomy, 1998 left knee operation, 1997 and 2001 perirectal abscess surgeries, 1992 kidney surgery, 1970 appendectomy, 1970 exploratory laparotomy, 1974 wisdom teeth, 1984, D and C.  In 1971, 1985, 1996, partial small-bowel resections; 1993, anal sphincterotomy; 1995 sinus surgery; 1999 oral surgeries; 2000 somnoplasty; 2001 left thumb surgery; 2004 neck liposuction; June 2010 cervical spine disk disease with decompression and fusion at the C4-C7 levels with cervical spine plate placement; April 2011 epidural corticosteroid injections on the left side at L5-S1 region.  MEDICATIONS:  Medications prior to hospitalization: 1. CombiPatch 0.05-0.14 mg per day every 2 weeks. 2. Effexor 37.5 mg p.o. twice daily. 3. Vitamin B12 1500 mcg IM every 3 weeks. 4. Vitamin D3 1000 units p.o. daily. 5. Lovaza 1 g take 2 g p.o. twice daily. 6. Glucosamine chondroitin sulfate as needed. 7.  Losartan HCTZ 50/12.5 one tablet p.o. every morning. 8. Enablex 15 mg p.o. once daily as needed for overactive bladder.  ALLERGIES:  PENICILLIN, ADVIL, ACTOS.  Note that aspirin use has been associated with microscopic hematuria.  SOCIAL HISTORY:  She is single and has no children.  She is a retired [employer].  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  She has no history of tobacco abuse or alcohol abuse.  FAMILY HISTORY:  Her father died  at age 15 from prostate cancer.  Her mother died at age 44 from complications of diabetes mellitus, type 2. She has a brother who also has diabetes mellitus, type 2.  REVIEW OF SYSTEMS:  She has not had recent fever or chills.  She has not had change in her vision, shortness of breath, cough, chest pain, palpitations, dysuria, frequency, or arthralgias.  She has not had significant problems with anxiety or depression on Effexor.  INITIAL PHYSICAL EXAMINATION:  Vital Signs:  Blood pressure 130/73, pulse 82, respirations 18, temperature 98.7, pulse oxygen saturation 95% on room air. GENERAL:  In general, she is a well-nourished, well-developed Caucasian woman who is in no apparent distress while lying supine in bed. HEENT:  Head, eyes, ears, nose and throat exam was within normal limits. NECK:  Supple without jugular venous distention or carotid bruit. CHEST:  Clear to auscultation. HEART:  Regular rate and rhythm.  It was without significant murmur or gallop. ABDOMEN:  Had very much reduced bowel sounds with tympany throughout. There was mild right upper quadrant tenderness without rebound tenderness. NEUROLOGICAL EXAM:  Nonfocal.  LABORATORY STUDIES:  White blood cell count was 18.7, hemoglobin 15, hematocrit 45 with 85% neutrophils, platelets 324.  Serum sodium 133, potassium 4.3, chloride 93, carbon dioxide 27, BUN 16, creatinine 0.97, glucose 152, serum calcium was 11.2, total protein 8.0, albumin 3.9 and lipase 15.  Her urinalysis showed specific gravity 1.010 with nitrite negative.  Abdomen x-rays showed nonspecific dilated loops with air-fluid levels and an air-fluid level within the left hemiabdomen which does not definitely correspond to bowel loop.  CT scan of the abdomen and pelvis was done with IV contrast that showed the following:  Possible fatty liver, distention of the small bowel with air-fluid levels to the level of the terminal ileum which she  is circumferentially thickened with adjacent stranding and vascular prominence, unremarkable colon, mesenteric lymph nodes are small to borderline enlarged, gallbladder was somewhat distended without evidence of gallstones.  This was felt to represent high-grade small bowel obstruction likely secondary to active Crohn's disease in the terminal ileum and there was questionable hepatic steatosis and scattered ascites.  IMPRESSION AND PLAN: 1. Nausea and vomiting:  This most likely is due to the small-bowel     obstruction seen on CT scan and given her history is most likely     due to exacerbation of Crohn's ileitis.  She has been given a dose     of Solu-Medrol IV and this will be continued.  We will also treat     her with IV fluids, Zofran as needed for nausea.  She does have     tenderness in the right upper quadrant that could be from her     gallbladder distention.  Should these symptoms persist, then we     will obtain a right upper quadrant ultrasound exam.  We will also     recheck an abdomen x-ray study tomorrow morning.  Upon discharge  from the hospital, she likely will need to resume taking Asacol as     this apparently help to prevent exacerbations of Crohn disease. 2. Hypertension:  Well controlled on current medications. 3. Hypercalcemia:  The calcium level is moderately elevated with a     normal serum albumin level.  She could have primary     hyperparathyroidism.  Other causes of hypercalcemia are less     likely.  We will check an intact PTH level. 4. Diabetes mellitus, type 2:  This has been well controlled in the     past with diet and exercise.  We will continue to monitor her blood     glucose levels on laboratory studies.          ______________________________ Barry Dienes Eloise Harman, M.D.     DGP/MEDQ  D:  06/27/2011  T:  06/27/2011  Job:  [id number]  cc:   Bernette Redbird, M.D. Fax: [phone]  Electronically Signed by Jarome Matin M.D. on 07/27/2011  08:43:34 AM

## 2012-01-17 ENCOUNTER — Other Ambulatory Visit: Payer: Self-pay | Admitting: Obstetrics and Gynecology

## 2012-01-17 DIAGNOSIS — Z1231 Encounter for screening mammogram for malignant neoplasm of breast: Secondary | ICD-10-CM

## 2012-01-26 ENCOUNTER — Ambulatory Visit
Admission: RE | Admit: 2012-01-26 | Discharge: 2012-01-26 | Disposition: A | Discharge status: Home / Self Care | Payer: BC Managed Care – PPO | Source: Ambulatory Visit | Attending: Obstetrics and Gynecology | Admitting: Obstetrics and Gynecology

## 2012-01-26 DIAGNOSIS — Z1231 Encounter for screening mammogram for malignant neoplasm of breast: Secondary | ICD-10-CM

## 2012-05-10 ENCOUNTER — Encounter: Payer: Self-pay | Admitting: Gynecology

## 2012-05-10 DIAGNOSIS — I10 Essential (primary) hypertension: Secondary | ICD-10-CM | POA: Insufficient documentation

## 2012-05-10 DIAGNOSIS — K5090 Crohn's disease, unspecified, without complications: Secondary | ICD-10-CM | POA: Insufficient documentation

## 2012-05-10 DIAGNOSIS — N951 Menopausal and female climacteric states: Secondary | ICD-10-CM | POA: Insufficient documentation

## 2012-05-16 ENCOUNTER — Encounter: Payer: Self-pay | Admitting: Obstetrics and Gynecology

## 2012-05-16 ENCOUNTER — Ambulatory Visit (INDEPENDENT_AMBULATORY_CARE_PROVIDER_SITE_OTHER): Payer: BC Managed Care – PPO | Admitting: Obstetrics and Gynecology

## 2012-05-16 VITALS — BP 122/78 | Ht 58.5 in | Wt 129.0 lb

## 2012-05-16 DIAGNOSIS — Z9189 Other specified personal risk factors, not elsewhere classified: Secondary | ICD-10-CM | POA: Insufficient documentation

## 2012-05-16 DIAGNOSIS — Z01419 Encounter for gynecological examination (general) (routine) without abnormal findings: Secondary | ICD-10-CM

## 2012-05-16 MED ORDER — ESTRADIOL-NORETHINDRONE ACET 0.05-0.14 MG/DAY TD PTTW: 1.0000 | MEDICATED_PATCH | TRANSDERMAL | Status: DC

## 2012-05-16 NOTE — Progress Notes (Signed)
Patient came to see me today for her annual GYN exam. She remains on the CombiPatch but instead of using it  Biweekly she uses 2 patches per month and only when she gets hot flashes. She has no vaginal bleeding. She has no pelvic pain. She has never had an abnormal Pap smear. She was hospitalized again this year with a bowel obstruction but she opened up with steroid use. She has Crohn's disease. She is up-to-date on mammograms. She does her lab through PCP. She Saw  Dr.  Tonia Brooms last summer and had a right axillary biopsy as well as two vulva  biopsies all of which were benign. The vulvar biopsies showed benign melanocytic macules.her last bone density was done there office in April 2011 and showed stable osteopenia without an elevated  fracture risk. She has had no fractures.  Physical examination: Alexis Ramos present. HEENT within normal limits. Neck: Thyroid not large. No masses. Supraclavicular nodes: not enlarged. Breasts: Examined in both sitting and lying  position. No skin changes and no masses. Abdomen: Soft no guarding rebound or masses or hernia. Pelvic: External: Within normal limits. BUS: Within normal limits. Vaginal:within normal limits. Poor estrogen effect. No evidence of cystocele rectocele or enterocele. Cervix: clean. Uterus: Normal size and shape. Adnexa: No masses. Rectovaginal exam: Confirmatory and negative. Extremities: Within normal limits.  Assessment: #1. Menopausal symptoms #2. Atrophic vaginitis #3. Low bone mass  Plan: Continue yearly mammograms. Bone density in 2014. Continue CombiPatch as above. As usual we discussed slightly higher risk of breast cancer with long-term use of HRT.

## 2012-05-17 LAB — URINALYSIS W MICROSCOPIC + REFLEX CULTURE
Bacteria, UA: NONE SEEN
Hgb urine dipstick: NEGATIVE
Ketones, ur: NEGATIVE mg/dL
Nitrite: NEGATIVE
Protein, ur: NEGATIVE mg/dL
Urobilinogen, UA: 0.2 mg/dL (ref 0.0–1.0)

## 2012-05-21 ENCOUNTER — Encounter: Payer: Self-pay | Admitting: Obstetrics and Gynecology

## 2013-01-08 ENCOUNTER — Other Ambulatory Visit: Payer: Self-pay | Admitting: Internal Medicine

## 2013-01-08 DIAGNOSIS — Z1231 Encounter for screening mammogram for malignant neoplasm of breast: Secondary | ICD-10-CM

## 2013-02-05 ENCOUNTER — Ambulatory Visit: Payer: BC Managed Care – PPO

## 2013-02-26 ENCOUNTER — Ambulatory Visit
Admission: RE | Admit: 2013-02-26 | Discharge: 2013-02-26 | Disposition: A | Discharge status: Home / Self Care | Payer: BC Managed Care – PPO | Source: Ambulatory Visit | Attending: Internal Medicine | Admitting: Internal Medicine

## 2013-02-26 DIAGNOSIS — Z1231 Encounter for screening mammogram for malignant neoplasm of breast: Secondary | ICD-10-CM

## 2013-04-23 ENCOUNTER — Other Ambulatory Visit: Payer: Self-pay | Admitting: Gastroenterology

## 2013-04-23 DIAGNOSIS — R1310 Dysphagia, unspecified: Secondary | ICD-10-CM

## 2013-04-25 ENCOUNTER — Ambulatory Visit
Admission: RE | Admit: 2013-04-25 | Discharge: 2013-04-25 | Disposition: A | Discharge status: Home / Self Care | Payer: BC Managed Care – PPO | Source: Ambulatory Visit | Attending: Gastroenterology | Admitting: Gastroenterology

## 2013-04-25 DIAGNOSIS — R1310 Dysphagia, unspecified: Secondary | ICD-10-CM

## 2014-07-08 ENCOUNTER — Other Ambulatory Visit: Payer: Self-pay

## 2014-07-08 DIAGNOSIS — Z1231 Encounter for screening mammogram for malignant neoplasm of breast: Secondary | ICD-10-CM

## 2014-07-15 ENCOUNTER — Encounter (INDEPENDENT_AMBULATORY_CARE_PROVIDER_SITE_OTHER): Payer: Self-pay

## 2014-07-15 ENCOUNTER — Ambulatory Visit
Admission: RE | Admit: 2014-07-15 | Discharge: 2014-07-15 | Disposition: A | Discharge status: Home / Self Care | Payer: Medicare Other | Source: Ambulatory Visit

## 2014-07-15 DIAGNOSIS — Z1231 Encounter for screening mammogram for malignant neoplasm of breast: Secondary | ICD-10-CM

## 2014-07-28 ENCOUNTER — Other Ambulatory Visit: Payer: Self-pay | Admitting: Internal Medicine

## 2014-07-28 DIAGNOSIS — R1310 Dysphagia, unspecified: Secondary | ICD-10-CM

## 2014-07-30 ENCOUNTER — Other Ambulatory Visit: Payer: Medicare Other

## 2014-08-05 ENCOUNTER — Ambulatory Visit
Admission: RE | Admit: 2014-08-05 | Discharge: 2014-08-05 | Disposition: A | Discharge status: Home / Self Care | Payer: Medicare Other | Source: Ambulatory Visit | Attending: Internal Medicine | Admitting: Internal Medicine

## 2014-08-05 DIAGNOSIS — R1310 Dysphagia, unspecified: Secondary | ICD-10-CM

## 2015-01-27 ENCOUNTER — Other Ambulatory Visit: Payer: Self-pay | Admitting: Internal Medicine

## 2015-01-27 DIAGNOSIS — R1031 Right lower quadrant pain: Secondary | ICD-10-CM

## 2015-01-27 DIAGNOSIS — K50919 Crohn's disease, unspecified, with unspecified complications: Secondary | ICD-10-CM

## 2015-01-29 ENCOUNTER — Ambulatory Visit
Admission: RE | Admit: 2015-01-29 | Discharge: 2015-01-29 | Disposition: A | Discharge status: Home / Self Care | Payer: Medicare Other | Source: Ambulatory Visit | Attending: Internal Medicine | Admitting: Internal Medicine

## 2015-01-29 DIAGNOSIS — R1031 Right lower quadrant pain: Secondary | ICD-10-CM

## 2015-01-29 DIAGNOSIS — K50919 Crohn's disease, unspecified, with unspecified complications: Secondary | ICD-10-CM

## 2015-02-10 ENCOUNTER — Other Ambulatory Visit (HOSPITAL_COMMUNITY): Payer: Self-pay | Admitting: Gastroenterology

## 2015-02-10 DIAGNOSIS — K760 Fatty (change of) liver, not elsewhere classified: Secondary | ICD-10-CM

## 2015-02-18 ENCOUNTER — Ambulatory Visit (HOSPITAL_COMMUNITY)
Admission: RE | Admit: 2015-02-18 | Discharge: 2015-02-18 | Disposition: A | Discharge status: Home / Self Care | Payer: Medicare Other | Source: Ambulatory Visit | Attending: Gastroenterology | Admitting: Gastroenterology

## 2015-02-18 DIAGNOSIS — K5090 Crohn's disease, unspecified, without complications: Secondary | ICD-10-CM | POA: Insufficient documentation

## 2015-02-18 DIAGNOSIS — K760 Fatty (change of) liver, not elsewhere classified: Secondary | ICD-10-CM | POA: Insufficient documentation

## 2015-03-25 DIAGNOSIS — E785 Hyperlipidemia, unspecified: Secondary | ICD-10-CM | POA: Insufficient documentation

## 2015-03-25 DIAGNOSIS — Z9049 Acquired absence of other specified parts of digestive tract: Secondary | ICD-10-CM | POA: Insufficient documentation

## 2015-03-27 DIAGNOSIS — N183 Chronic kidney disease, stage 3 unspecified: Secondary | ICD-10-CM | POA: Insufficient documentation

## 2015-09-09 ENCOUNTER — Other Ambulatory Visit: Payer: Self-pay

## 2015-09-09 DIAGNOSIS — Z1231 Encounter for screening mammogram for malignant neoplasm of breast: Secondary | ICD-10-CM

## 2015-10-13 ENCOUNTER — Ambulatory Visit
Admission: RE | Admit: 2015-10-13 | Discharge: 2015-10-13 | Disposition: A | Discharge status: Home / Self Care | Payer: Medicare Other | Source: Ambulatory Visit

## 2015-10-13 DIAGNOSIS — Z1231 Encounter for screening mammogram for malignant neoplasm of breast: Secondary | ICD-10-CM

## 2015-10-27 ENCOUNTER — Other Ambulatory Visit: Payer: Self-pay | Admitting: Gastroenterology

## 2016-03-12 HISTORY — PX: SKIN CANCER EXCISION: SHX779

## 2016-06-11 ENCOUNTER — Emergency Department (HOSPITAL_COMMUNITY): Payer: Medicare Other

## 2016-06-11 ENCOUNTER — Encounter (HOSPITAL_COMMUNITY): Payer: Self-pay | Admitting: Emergency Medicine

## 2016-06-11 ENCOUNTER — Emergency Department (HOSPITAL_COMMUNITY)
Admission: EM | Admit: 2016-06-11 | Discharge: 2016-06-12 | Disposition: A | Discharge status: Home / Self Care | Payer: Medicare Other | Attending: Emergency Medicine | Admitting: Emergency Medicine

## 2016-06-11 DIAGNOSIS — Z79899 Other long term (current) drug therapy: Secondary | ICD-10-CM | POA: Diagnosis not present

## 2016-06-11 DIAGNOSIS — R509 Fever, unspecified: Secondary | ICD-10-CM | POA: Diagnosis not present

## 2016-06-11 DIAGNOSIS — R1011 Right upper quadrant pain: Secondary | ICD-10-CM | POA: Insufficient documentation

## 2016-06-11 DIAGNOSIS — I10 Essential (primary) hypertension: Secondary | ICD-10-CM | POA: Insufficient documentation

## 2016-06-11 DIAGNOSIS — F329 Major depressive disorder, single episode, unspecified: Secondary | ICD-10-CM | POA: Insufficient documentation

## 2016-06-11 DIAGNOSIS — R52 Pain, unspecified: Secondary | ICD-10-CM

## 2016-06-11 LAB — URINALYSIS, ROUTINE W REFLEX MICROSCOPIC
Bilirubin Urine: NEGATIVE
Glucose, UA: NEGATIVE mg/dL
HGB URINE DIPSTICK: NEGATIVE
Ketones, ur: NEGATIVE mg/dL
Leukocytes, UA: NEGATIVE
Nitrite: NEGATIVE
PH: 5.5 (ref 5.0–8.0)
Protein, ur: 30 mg/dL — AB
Specific Gravity, Urine: 1.022 (ref 1.005–1.030)

## 2016-06-11 LAB — COMPREHENSIVE METABOLIC PANEL
ALT: 35 U/L (ref 14–54)
ANION GAP: 9 (ref 5–15)
AST: 29 U/L (ref 15–41)
Albumin: 3.9 g/dL (ref 3.5–5.0)
Alkaline Phosphatase: 68 U/L (ref 38–126)
BUN: 13 mg/dL (ref 6–20)
CO2: 24 mmol/L (ref 22–32)
Calcium: 8.9 mg/dL (ref 8.9–10.3)
Chloride: 105 mmol/L (ref 101–111)
Creatinine, Ser: 1.29 mg/dL — ABNORMAL HIGH (ref 0.44–1.00)
GFR calc non Af Amer: 42 mL/min — ABNORMAL LOW (ref >60)
GFR, EST AFRICAN AMERICAN: 49 mL/min — AB (ref >60)
GLUCOSE: 131 mg/dL — AB (ref 65–99)
POTASSIUM: 3.8 mmol/L (ref 3.5–5.1)
SODIUM: 138 mmol/L (ref 135–145)
Total Bilirubin: 1.1 mg/dL (ref 0.3–1.2)
Total Protein: 7.1 g/dL (ref 6.5–8.1)

## 2016-06-11 LAB — URINE MICROSCOPIC-ADD ON: Bacteria, UA: NONE SEEN

## 2016-06-11 LAB — DIFFERENTIAL
Basophils Absolute: 0 10*3/uL (ref 0.0–0.1)
Basophils Relative: 0 %
Eosinophils Absolute: 0.1 10*3/uL (ref 0.0–0.7)
Eosinophils Relative: 1 %
Lymphocytes Relative: 21 %
Lymphs Abs: 1.5 10*3/uL (ref 0.7–4.0)
Monocytes Absolute: 1.1 10*3/uL — ABNORMAL HIGH (ref 0.1–1.0)
Monocytes Relative: 15 %
Neutro Abs: 4.6 10*3/uL (ref 1.7–7.7)
Neutrophils Relative %: 63 %

## 2016-06-11 LAB — CBC
HEMATOCRIT: 38.6 % (ref 36.0–46.0)
HEMOGLOBIN: 12.7 g/dL (ref 12.0–15.0)
MCH: 29.8 pg (ref 26.0–34.0)
MCHC: 32.9 g/dL (ref 30.0–36.0)
MCV: 90.6 fL (ref 78.0–100.0)
Platelets: 156 10*3/uL (ref 150–400)
RBC: 4.26 MIL/uL (ref 3.87–5.11)
RDW: 13.8 % (ref 11.5–15.5)
WBC: 7.5 10*3/uL (ref 4.0–10.5)

## 2016-06-11 LAB — I-STAT CG4 LACTIC ACID, ED
Lactic Acid, Venous: 1.79 mmol/L (ref 0.5–1.9)
Lactic Acid, Venous: 0.72 mmol/L (ref 0.5–1.9)

## 2016-06-11 LAB — LIPASE, BLOOD: LIPASE: 24 U/L (ref 11–51)

## 2016-06-11 MED ORDER — SODIUM CHLORIDE 0.9 % IV BOLUS (SEPSIS)
1000.0000 mL | Freq: Once | INTRAVENOUS | Status: AC
  Administered 2016-06-11: 1000 mL via INTRAVENOUS

## 2016-06-11 MED ORDER — IOPAMIDOL (ISOVUE-300) INJECTION 61%
100.0000 mL | Freq: Once | INTRAVENOUS | Status: AC | PRN
  Administered 2016-06-11: 100 mL via INTRAVENOUS

## 2016-06-11 MED ORDER — ONDANSETRON HCL 4 MG/2ML IJ SOLN: 4.0000 mg | Freq: Once | INTRAMUSCULAR | Status: DC | PRN

## 2016-06-11 MED ORDER — DIATRIZOATE MEGLUMINE & SODIUM 66-10 % PO SOLN
30.0000 mL | Freq: Once | ORAL | Status: AC
  Administered 2016-06-11: 30 mL via ORAL

## 2016-06-11 MED ORDER — SODIUM CHLORIDE 0.9 % IV SOLN
Freq: Once | INTRAVENOUS | Status: AC
  Administered 2016-06-11: 19:00:00 via INTRAVENOUS

## 2016-06-11 MED ORDER — HYDROMORPHONE HCL 1 MG/ML IJ SOLN: 0.5000 mg | Freq: Once | INTRAMUSCULAR | Status: DC | PRN

## 2016-06-11 MED ORDER — ACETAMINOPHEN 325 MG PO TABS
650.0000 mg | ORAL_TABLET | Freq: Once | ORAL | Status: AC
  Administered 2016-06-11: 650 mg via ORAL
  Filled 2016-06-11: qty 2

## 2016-06-11 NOTE — ED Notes (Signed)
Pt reports intermittent nausea, fever, and generalized abdominal pain onset Wednesday; hx of Crohn.

## 2016-06-11 NOTE — ED Provider Notes (Signed)
CSN: [id number]     Arrival date & time 06/11/16  1811 History   First MD Initiated Contact with Patient 06/11/16 1817     Chief Complaint  Patient presents with  . Fever  . Abdominal Pain     (Consider location/radiation/quality/duration/timing/severity/associated sxs/prior Treatment) HPI  65 year old female who presents with abdominal pain. She has a history of Crohn's disease status post multiple bowel obstructions requiring small bowel resection 3, appendectomy, hypertension, and cholelithiasis. States that she otherwise has been in her usual state of health and 3 days ago while [place] the beach began to feel generalized malaise and feeling unwell. Does develop decreased appetite, and endorsed generalized abdominal discomfort. Has been having subjective fevers and chills, and today had a temperature of 101.5 Fahrenheit at home. She reports chronic diarrhea that is unchanged, no melena or hematochezia. No nausea or vomiting. Denies that pain is postprandial in nature, but does endorse not wanting to eat or drink much. Sent to ED by PCP   Past Medical History  Diagnosis Date  . Hypertension   . Crohn's disease ([place])   . Menopausal symptoms   . Depression   . DES exposure in utero    Past Surgical History  Procedure Laterality Date  . Appendectomy    . Bowel resection    . Anal sphincterectomy    . Nasal sinus surgery    . Tongue base reduction somnoplasty      Removed uvula  . Tonsillectomy and adenoidectomy    . Cervical fusion    . Hand surgery    . Thumb surg    . Knee surgery    . Dilation and curettage of uterus    . Kidney surgery    . Anal abscess surg    . Breast surgery      Biopsy-Benign   Family History  Problem Relation Age of Onset  . Diabetes Mother   . Hypertension Mother   . Osteoporosis Mother   . Hypertension Father   . Heart disease Father   . Prostate cancer Father   . Diabetes Brother   . Hypertension Brother   . Cancer Other    Niece-Glioblastoma   Social History  Substance Use Topics  . Smoking status: Never Smoker   . Smokeless tobacco: None  . Alcohol Use: No   OB History    Gravida Para Term Preterm AB TAB SAB Ectopic Multiple Living   0              Review of Systems 10/14 systems reviewed and are negative other than those stated in the HPI   Allergies  Penicillins; AdvilDiona Fanti; and Daypro  Home Medications   Prior to Admission medications   Medication Sig Start Date End Date Taking? Authorizing Provider  amLODipine (NORVASC) 5 MG tablet Take 5 mg by mouth daily. 02/28/16  Yes Historical Provider, MD  Cholecalciferol (VITAMIN D PO) Take 5,000 Units by mouth.   Yes Historical Provider, MD  Cyanocobalamin (VITAMIN B-12 IJ) Inject as directed every 21 ( twenty-one) days.    Yes Historical Provider, MD  KRILL OIL PO Take 1 capsule by mouth daily.   Yes Historical Provider, MD  losartan (COZAAR) 100 MG tablet Take 100 mg by mouth daily.   Yes Historical Provider, MD  metoprolol succinate (TOPROL-XL) 25 MG 24 hr tablet Take 25 mg by mouth daily. 05/01/16  Yes Historical Provider, MD  venlafaxine (EFFEXOR) 75 MG tablet Take 75 mg by mouth 2 (two) times daily  with a meal.  05/16/16  Yes Historical Provider, MD  vitamin E 400 UNIT capsule Take 800 Units by mouth daily.    Yes Historical Provider, MD   BP 114/55 mmHg  Pulse 77  Temp(Src) 98 F (36.7 C) (Oral)  Resp 18  Ht 4' 11"  (1.499 m)  Wt 120 lb (54.432 kg)  BMI 24.22 kg/m2  SpO2 98% Physical Exam Physical Exam  Nursing note and vitals reviewed. Constitutional: Well developed, well nourished, non-toxic, and in no acute distress Head: Normocephalic and atraumatic.  Mouth/Throat: Oropharynx is clear and moist.  Neck: Normal range of motion. Neck supple.  Cardiovascular: Normal rate and regular rhythm.   Pulmonary/Chest: Effort normal and breath sounds normal.  Abdominal: Soft. There is RUQ tenderness. There is no rebound and no guarding. No CVA  tenderness Musculoskeletal: Normal range of motion.  Neurological: Alert, no facial droop, fluent speech, moves all extremities symmetrically Skin: Skin is warm and dry.  Psychiatric: Cooperative  ED Course  Procedures (including critical care time) Labs Review Labs Reviewed  COMPREHENSIVE METABOLIC PANEL - Abnormal; Notable for the following:    Glucose, Bld 131 (*)    Creatinine, Ser 1.29 (*)    GFR calc non Af Amer 42 (*)    GFR calc Af Amer 49 (*)    All other components within normal limits  URINALYSIS, ROUTINE W REFLEX MICROSCOPIC (NOT AT [facility]) - Abnormal; Notable for the following:    Color, Urine AMBER (*)    Protein, ur 30 (*)    All other components within normal limits  URINE MICROSCOPIC-ADD ON - Abnormal; Notable for the following:    Squamous Epithelial / LPF 0-5 (*)    All other components within normal limits  DIFFERENTIAL - Abnormal; Notable for the following:    Monocytes Absolute 1.1 (*)    All other components within normal limits  LIPASE, BLOOD  CBC  I-STAT CG4 LACTIC ACID, ED  I-STAT CG4 LACTIC ACID, ED    Imaging Review Ct Abdomen Pelvis W Contrast  06/11/2016  CLINICAL DATA:  Abdominal pain.  Febrile. EXAM: CT ABDOMEN AND PELVIS WITH CONTRAST TECHNIQUE: Multidetector CT imaging of the abdomen and pelvis was performed using the standard protocol following bolus administration of intravenous contrast. CONTRAST:  176m ISOVUE-300 IOPAMIDOL (ISOVUE-300) INJECTION 61% COMPARISON:  01/29/2015 FINDINGS: Lower chest:  No significant abnormality Hepatobiliary: Diffuse fatty infiltration of the liver. No focal liver lesions. Gallbladder and bile ducts are normal. Pancreas: Normal Spleen: Normal Adrenals/Urinary Tract: The adrenals and kidneys are normal in appearance. There is no urinary calculus evident. There is no hydronephrosis or ureteral dilatation. Collecting systems and ureters appear unremarkable. Stomach/Bowel: The stomach appears normal. The small bowel is  normal in its proximal and midportions. There is abnormal mural thickening of the terminal ileum consistent with the described history of Crohn's disease. This is milder than on 01/29/2015. The mural thickening of the terminal ileum may be chronic, and the terminal ileal lumen can be seen to be widely patent on this examination. There is no obstruction. Enteric contrast has reached the descending colon. No abscess. No fistula is evident. No extraluminal air. Colon is unremarkable. Vascular/Lymphatic: The abdominal aorta is normal in caliber. There is mild atherosclerotic calcification. There is no adenopathy in the abdomen or pelvis. Reproductive: Uterus and adnexal structures are unremarkable. Other: No focal inflammatory changes are evident in the abdomen or pelvis. Musculoskeletal: No significant skeletal lesions. IMPRESSION: 1. Abnormal mural thickening of the terminal ileum consistent with the described  history of Crohn's disease. There is no obstruction, perforation, abscess or fistula evident. 2. Diffuse fatty infiltration of the liver without focal lesion. Electronically Signed   By: Andreas Newport M.D.   On: 06/11/2016 23:49   US Abdomen Limited Ruq  06/11/2016  CLINICAL DATA:  66 year old female with right upper quadrant abdominal pain EXAM: US ABDOMEN LIMITED - RIGHT UPPER QUADRANT COMPARISON:  Ultrasound dated 02/18/2015 FINDINGS: Gallbladder: There is a small stone within the gallbladder. There is no gallbladder wall thickening or pericholecystic fluid. Negative sonographic Murphy's sign. Common bile duct: Diameter: 5 mm Liver: There is diffuse increased hepatic echogenicity compatible with fatty infiltration. IMPRESSION: Cholelithiasis without sonographic evidence acute cholecystitis. Fatty liver. Electronically Signed   By: Anner Crete M.D.   On: 06/11/2016 20:36   I have personally reviewed and evaluated these images and lab results as part of my medical decision-making.   EKG  Interpretation None      MDM   Final diagnoses:  RUQ pain  Fever, unspecified fever cause    65 year old female who presents with abdominal pain. Presentation is nontoxic in no acute distress. Does have a fever of 100.5 Fahrenheit. With overall soft and non-peritoneal abdomen but with some focal right upper quadrant abdominal pain. No leukocytosis or elevated lactate. Normal urine and no respiratory infectious complaints. She initially had right upper quadrant ultrasound with cholelithiasis but no cholecystitis. CT abdomen and pelvis is also unremarkable with chronic mural thickening of the terminal ileum that is unchanged and consistent with history of Crohn's disease. Still having some persistent right upper quadrant abdominal pain and in the setting of this fever, ? Occult cholecystitis. Discussed with general surgery, and Dr. Marcello Moores will come and evaluate patient. Pending recommendations at this time.    Forde Dandy, MD 06/12/16 Shelah Lewandowsky

## 2016-06-12 ENCOUNTER — Emergency Department (HOSPITAL_COMMUNITY): Payer: Medicare Other

## 2016-06-12 NOTE — ED Notes (Signed)
Patient d/c'd self care.  F/U and medications discussed.  Patient verbalized understanding. 

## 2016-06-12 NOTE — Consult Note (Signed)
Reason for Consult: Abd pain, fevers Referring Physician: Dr Randa Lynn is an 65 y.o. female.  HPI: 65 y.o. F with a h/o Crohn's disease who presents to the ED with 2-3 days of vague abd pain and occasional nausea.  No vomiting or change in bowel habits.  No post prandial pain.  Low grade fevers at home.  Was recently at the beach and taking care of a young child.  Had pain similar to this ~24yr ago but no source was identified.  Has had 4 abd surgeries in the past, mostly for Crohn's disease.    Past Medical History  Diagnosis Date  . Hypertension   . [place]'s [place]   . Menopausal symptoms   . Depression   . DES exposure in utero     Past Surgical History  Procedure Laterality Date  . Appendectomy    . Bowel resection    . Anal sphincterectomy    . Nasal sinus surgery    . Tongue base reduction somnoplasty      Removed uvula  . Tonsillectomy and adenoidectomy    . Cervical fusion    . Hand surgery    . Thumb surg    . Knee surgery    . Dilation and curettage of uterus    . Kidney surgery    . Anal abscess surg    . Breast surgery      Biopsy-Benign    Family History  Problem Relation Age of Onset  . Diabetes Mother   . Hypertension Mother   . Osteoporosis Mother   . Hypertension Father   . Heart disease Father   . Prostate cancer Father   . Diabetes Brother   . Hypertension Brother   . Cancer Other     Niece-Glioblastoma    Social History:  reports that she has never smoked. She does not have any smokeless tobacco history on file. She reports that she does not drink alcohol. Her drug history is not on file.  Allergies:  Allergies  Allergen Reactions  . Penicillins Anaphylaxis  . Advil [Ibuprofen]     Bleeding.   .Diona Fanti[Aspirin]     Hemorrhage.     .Lanae Crumbly[Oxaprozin]     Bleeding.    Medications: I have reviewed the patient's current medications.  Results for orders placed or performed during the hospital encounter of 06/11/16 (from  the past 48 hour(s))  Lipase, blood     Status: None   Collection Time: 06/11/16  6:40 PM  Result Value Ref Range   Lipase 24 11 - 51 U/L  Comprehensive metabolic panel     Status: Abnormal   Collection Time: 06/11/16  6:40 PM  Result Value Ref Range   Sodium 138 135 - 145 mmol/L   Potassium 3.8 3.5 - 5.1 mmol/L   Chloride 105 101 - 111 mmol/L   CO2 24 22 - 32 mmol/L   Glucose, Bld 131 (H) 65 - 99 mg/dL   BUN 13 6 - 20 mg/dL   Creatinine, Ser 1.29 (H) 0.44 - 1.00 mg/dL   Calcium 8.9 8.9 - 10.3 mg/dL   Total Protein 7.1 6.5 - 8.1 g/dL   Albumin 3.9 3.5 - 5.0 g/dL   AST 29 15 - 41 U/L   ALT 35 14 - 54 U/L   Alkaline Phosphatase 68 38 - 126 U/L   Total Bilirubin 1.1 0.3 - 1.2 mg/dL   GFR calc non Af Amer 42 (L) >60  mL/min   GFR calc Af Amer 49 (L) >60 mL/min    Comment: (NOTE) The eGFR has been calculated using the CKD EPI equation. This calculation has not been validated in all clinical situations. eGFR's persistently <60 mL/min signify possible Chronic Kidney Disease.    Anion gap 9 5 - 15  CBC     Status: None   Collection Time: 06/11/16  6:40 PM  Result Value Ref Range   WBC 7.5 4.0 - 10.5 K/uL   RBC 4.26 3.87 - 5.11 MIL/uL   Hemoglobin 12.7 12.0 - 15.0 g/dL   HCT 38.6 36.0 - 46.0 %   MCV 90.6 78.0 - 100.0 fL   MCH 29.8 26.0 - 34.0 pg   MCHC 32.9 30.0 - 36.0 g/dL   RDW 13.8 11.5 - 15.5 %   Platelets 156 150 - 400 K/uL  Differential     Status: Abnormal   Collection Time: 06/11/16  6:40 PM  Result Value Ref Range   Neutrophils Relative % 63 %   Neutro Abs 4.6 1.7 - 7.7 K/uL   Lymphocytes Relative 21 %   Lymphs Abs 1.5 0.7 - 4.0 K/uL   Monocytes Relative 15 %   Monocytes Absolute 1.1 (H) 0.1 - 1.0 K/uL   Eosinophils Relative 1 %   Eosinophils Absolute 0.1 0.0 - 0.7 K/uL   Basophils Relative 0 %   Basophils Absolute 0.0 0.0 - 0.1 K/uL  Urinalysis, Routine w reflex microscopic     Status: Abnormal   Collection Time: 06/11/16  7:01 PM  Result Value Ref Range    Color, Urine AMBER (A) YELLOW    Comment: BIOCHEMICALS MAY BE AFFECTED BY COLOR   APPearance CLEAR CLEAR   Specific Gravity, Urine 1.022 1.005 - 1.030   pH 5.5 5.0 - 8.0   Glucose, UA NEGATIVE NEGATIVE mg/dL   Hgb urine dipstick NEGATIVE NEGATIVE   Bilirubin Urine NEGATIVE NEGATIVE   Ketones, ur NEGATIVE NEGATIVE mg/dL   Protein, ur 30 (A) NEGATIVE mg/dL   Nitrite NEGATIVE NEGATIVE   Leukocytes, UA NEGATIVE NEGATIVE  Urine microscopic-add on     Status: Abnormal   Collection Time: 06/11/16  7:01 PM  Result Value Ref Range   Squamous Epithelial / LPF 0-5 (A) NONE SEEN   WBC, UA 0-5 0 - 5 WBC/hpf   RBC / HPF 0-5 0 - 5 RBC/hpf   Bacteria, UA NONE SEEN NONE SEEN   Urine-Other MUCOUS PRESENT   I-Stat CG4 Lactic Acid, ED     Status: None   Collection Time: 06/11/16  7:05 PM  Result Value Ref Range   Lactic Acid, Venous 1.79 0.5 - 1.9 mmol/L  I-Stat CG4 Lactic Acid, ED     Status: None   Collection Time: 06/11/16 10:57 PM  Result Value Ref Range   Lactic Acid, Venous 0.72 0.5 - 1.9 mmol/L    Ct Abdomen Pelvis W Contrast  06/11/2016  CLINICAL DATA:  Abdominal pain.  Febrile. EXAM: CT ABDOMEN AND PELVIS WITH CONTRAST TECHNIQUE: Multidetector CT imaging of the abdomen and pelvis was performed using the standard protocol following bolus administration of intravenous contrast. CONTRAST:  139m ISOVUE-300 IOPAMIDOL (ISOVUE-300) INJECTION 61% COMPARISON:  01/29/2015 FINDINGS: Lower chest:  No significant abnormality Hepatobiliary: Diffuse fatty infiltration of the liver. No focal liver lesions. Gallbladder and bile ducts are normal. Pancreas: Normal Spleen: Normal Adrenals/Urinary Tract: The adrenals and kidneys are normal in appearance. There is no urinary calculus evident. There is no hydronephrosis or ureteral dilatation. Collecting systems  and ureters appear unremarkable. Stomach/Bowel: The stomach appears normal. The small bowel is normal in its proximal and midportions. There is abnormal mural  thickening of the terminal ileum consistent with the described history of Crohn's disease. This is milder than on 01/29/2015. The mural thickening of the terminal ileum may be chronic, and the terminal ileal lumen can be seen to be widely patent on this examination. There is no obstruction. Enteric contrast has reached the descending colon. No abscess. No fistula is evident. No extraluminal air. Colon is unremarkable. Vascular/Lymphatic: The abdominal aorta is normal in caliber. There is mild atherosclerotic calcification. There is no adenopathy in the abdomen or pelvis. Reproductive: Uterus and adnexal structures are unremarkable. Other: No focal inflammatory changes are evident in the abdomen or pelvis. Musculoskeletal: No significant skeletal lesions. IMPRESSION: 1. Abnormal mural thickening of the terminal ileum consistent with the described history of Crohn's disease. There is no obstruction, perforation, abscess or fistula evident. 2. Diffuse fatty infiltration of the liver without focal lesion. Electronically Signed   By: Andreas Newport M.D.   On: 06/11/2016 23:49   US Abdomen Limited Ruq  06/11/2016  CLINICAL DATA:  65 year old female with right upper quadrant abdominal pain EXAM: US ABDOMEN LIMITED - RIGHT UPPER QUADRANT COMPARISON:  Ultrasound dated 02/18/2015 FINDINGS: Gallbladder: There is a small stone within the gallbladder. There is no gallbladder wall thickening or pericholecystic fluid. Negative sonographic Murphy's sign. Common bile duct: Diameter: 5 mm Liver: There is diffuse increased hepatic echogenicity compatible with fatty infiltration. IMPRESSION: Cholelithiasis without sonographic evidence acute cholecystitis. Fatty liver. Electronically Signed   By: Anner Crete M.D.   On: 06/11/2016 20:36    Review of Systems  Constitutional: Positive for fever. Negative for chills and diaphoresis.  HENT: Negative for hearing loss.   Eyes: Negative for blurred vision and double vision.   Respiratory: Negative for cough and shortness of breath.   Cardiovascular: Negative for chest pain and palpitations.  Gastrointestinal: Positive for nausea and abdominal pain. Negative for vomiting, diarrhea, constipation and blood in stool.  Genitourinary: Negative for dysuria, urgency and frequency.  Musculoskeletal: Negative for myalgias, back pain and neck pain.  Skin: Negative for itching and rash.  Neurological: Negative for dizziness, tingling and headaches.   Blood pressure 114/55, pulse 77, temperature 98 F (36.7 C), temperature source Oral, resp. rate 18, height 4' 11"  (1.499 m), weight 54.432 kg (120 lb), SpO2 98 %. Physical Exam  Constitutional: She is oriented to person, place, and time. She appears well-developed and well-nourished.  HENT:  Head: Normocephalic and atraumatic.  Right Ear: External ear normal.  Left Ear: External ear normal.  Nose: Nose normal.  Eyes: Conjunctivae and EOM are normal. Pupils are equal, round, and reactive to light.  Neck: Normal range of motion. Neck supple.  Cardiovascular: Normal rate and regular rhythm.   Respiratory: Effort normal and breath sounds normal. No respiratory distress.  GI: Soft. She exhibits no distension.  Mild tenderness to palpation in RUQ and midepigastrum  Musculoskeletal: Normal range of motion.  Neurological: She is alert and oriented to person, place, and time.  Skin: Skin is warm and dry.    Assessment/Plan: 65 y.o. F with vague abd pain and mild RUQ tenderness on exam.  CT and RUQ US WNL's.  LFT's and WBC normal.  Pain not very consistent with biliary colic except for location.  Would recommend further w/u as an outpatient to determine a source for her pain.  Could undergo elective cholecystectomy in the future  if no other source is found.    [place] C. 04/19/9356, 01:77 AM

## 2016-06-12 NOTE — Discharge Instructions (Signed)
°  SEEK IMMEDIATE MEDICAL ATTENTION IF: °The pain does not go away or becomes severe, particularly over the next 8-12 hours.  °A temperature above 100.4F develops.  °Repeated vomiting occurs (multiple episodes).  ° °Blood is being passed in stools or vomit (bright red or black tarry stools).  °Return also if you develop chest pain, difficulty breathing, dizziness or fainting, or become confused, poorly responsive, or inconsolable. ° °

## 2016-06-12 NOTE — ED Provider Notes (Signed)
I assumed care at signout to f/u on surgical consult See gen surgery noted Recommended outpatient workup Pt feels improved She has no complaints at this time We discussed strict return precautions She is well appearing   Ripley Fraise, MD 06/12/16 0126

## 2016-06-17 ENCOUNTER — Other Ambulatory Visit (HOSPITAL_COMMUNITY): Payer: Self-pay | Admitting: Internal Medicine

## 2016-06-17 DIAGNOSIS — R1031 Right lower quadrant pain: Secondary | ICD-10-CM

## 2016-06-23 ENCOUNTER — Ambulatory Visit (HOSPITAL_COMMUNITY)
Admission: RE | Admit: 2016-06-23 | Discharge: 2016-06-23 | Disposition: A | Discharge status: Home / Self Care | Payer: Medicare Other | Source: Ambulatory Visit | Attending: Internal Medicine | Admitting: Internal Medicine

## 2016-06-23 DIAGNOSIS — R1031 Right lower quadrant pain: Secondary | ICD-10-CM | POA: Diagnosis not present

## 2016-06-23 DIAGNOSIS — R935 Abnormal findings on diagnostic imaging of other abdominal regions, including retroperitoneum: Secondary | ICD-10-CM | POA: Diagnosis not present

## 2016-06-23 MED ORDER — TECHNETIUM TC 99M MEBROFENIN IV KIT
5.0000 | PACK | Freq: Once | INTRAVENOUS | Status: AC | PRN
  Administered 2016-06-23: 5 via INTRAVENOUS

## 2016-07-11 ENCOUNTER — Ambulatory Visit: Payer: Self-pay | Admitting: Surgery

## 2016-07-11 MED ORDER — GENTAMICIN SULFATE 40 MG/ML IJ SOLN: 100.0000 mg | INTRAMUSCULAR | Status: AC

## 2016-07-11 MED ORDER — DEXTROSE 5 % IV SOLN: 600.0000 mg | INTRAVENOUS | Status: AC

## 2016-07-11 NOTE — H&P (Signed)
Alexis Ramos 07/11/2016 1:38 PM Location: [facility] Patient #: [id number] DOB: 1951-08-11 Single / Language: [name] / Race: White Female  History of Present Illness [name] MD; 07/11/2016 5:18 PM) The patient is a 65 year old female who presents for evaluation of gallbladder disease. Note for "Gallbladder disease": Patient sent for surgical consultation by her primary care physician, Dr. [name]. Concern for decreased gallbladder ejection fraction and probable biliary colic.  Pleasant woman. History of Crohn's disease for most of her life. She is require laparotomy 4 times in the 1970s to 90s. On chronic Asacol. Followed intermittently had [facility] as well as by Dr. [name] with [facility] gastroenterology. Has been off all medications for the past year since there was concerned she may have liver hepatitis from this. Usually has 5 or 6 bowel movements a day. Occasionally loose but not severe. No major flares in many years. No major change in bowel function.  However she does get episodes of upper abdominal discomfort. She feels "gripe-y". She wakes up with some discomfort. Discussed with her primary care physician. Went to the emergency room. She recalls a similar episode of that was more severe and she thinks she threw up about 10 years ago. Workup suspicious for a gallstone without evidence of Crohn's flare obstruction. Held off on anything. No attacks for the past 10 years. However she's had a few in the past month. Usually can eat most foods though. Can walk a half hour without difficulty. Had an attack bed know if she called her primary care physician the middle night. Went to the emergency room. Lab work okay. Ultrasound with probable gallstone. No definite cholecystitis. Surgical consultation me. Not classic for gallbladder problems. No evidence of worsening Crohn inflammation. CT scan better than a year ago. No evidence of bowel obstruction. No  evidence of perforation. No diverticulitis. She doesn't think she has appendix any more. Symptoms improved. Went home. Had another episode of discomfort. Saw primary care physician in office. Had nuclear medicine HIDA scan. Gallbladder ejection fraction 9%. However she does not recall having much pain or discomfort around the time of the study.  He's not certain what she wants to do but doesn't like the attacks when she gets them. Does not seem like heartburn. Not like Crohn's.   Other Problems [name], CMA; 07/11/2016 1:38 PM) Cholelithiasis Crohn's Disease Depression Gastroesophageal Reflux Disease High blood pressure Kidney Stone Melanoma  Past Surgical History [name], CMA; 07/11/2016 1:38 PM) Appendectomy Breast Biopsy Right. Colon Polyp Removal - Colonoscopy Knee Surgery Left. Oral Surgery Resection of Small Bowel Spinal Surgery - Neck Tonsillectomy  Diagnostic Studies History [name], CMA; 07/11/2016 1:38 PM) Colonoscopy within last year Mammogram within last year Pap Smear 1-5 years ago  Allergies [name], CMA; 07/11/2016 1:40 PM) Aspirin *ANALGESICS - NonNarcotic* Penicillin G Pot in Dextrose *PENICILLINS* Advil *ANALGESICS - ANTI-INFLAMMATORY* Oxaprozin *ANALGESICS - ANTI-INFLAMMATORY*  Medication History ([name], CMA; 07/11/2016 1:39 PM) Losartan Potassium (100MG Tablet, Oral) Active. AmLODIPine Besylate (5MG Tablet, Oral) Active. Metoprolol Succinate ER (25MG Tablet ER 24HR, Oral) Active. Venlafaxine HCl (75MG Tablet, Oral) Active. Medications Reconciled  Social History [name], CMA; 07/11/2016 1:38 PM) Caffeine use Carbonated beverages, Coffee. No alcohol use No drug use Tobacco use Never smoker.  Family History [name], [place]; 07/11/2016 1:38 PM) Cerebrovascular Accident Father, Mother. Colon Polyps Brother, Father, Mother. Diabetes Mellitus Brother, Mother. Heart Disease  Father. Hypertension Brother, Father, Mother. Kidney Disease Father. Prostate Cancer Brother, Father. Thyroid problems Mother.  Pregnancy / Birth History [name], [place]; 07/11/2016 1:38 PM) Age at menarche 72 years. Age of menopause 25-50 Gravida 0 Para 0     Review of Systems ([place]; 07/11/2016 1:38 PM) General Present- Fatigue. Not Present- Appetite Loss, Chills, Fever, Night Sweats, Weight Gain and Weight Loss. Skin Not Present- Change in Wart/Mole, Dryness, Hives, Jaundice, New Lesions, Non-Healing Wounds, Rash and Ulcer. HEENT Present- Wears glasses/contact lenses. Not Present- Earache, Hearing Loss, Hoarseness, Nose Bleed, Oral Ulcers, Ringing in the Ears, Seasonal Allergies, Sinus Pain, Sore Throat, Visual Disturbances and Yellow Eyes. Respiratory Not Present- Bloody sputum, Chronic Cough, Difficulty Breathing, Snoring and Wheezing. Breast Not Present- Breast Mass, Breast Pain, Nipple Discharge and Skin Changes. Cardiovascular Not Present- Chest Pain, Difficulty Breathing Lying Down, Leg Cramps, Palpitations, Rapid Heart Rate, Shortness of Breath and Swelling of Extremities. Gastrointestinal Present- Bloating, Chronic diarrhea and Indigestion. Not Present- Abdominal Pain, Bloody Stool, Change in Bowel Habits, Constipation, Difficulty Swallowing, Excessive gas, Gets full quickly at meals, Hemorrhoids, Nausea, Rectal Pain and Vomiting. Female Genitourinary Present- Urgency. Not Present- Frequency, Nocturia, Painful Urination and Pelvic Pain. Musculoskeletal Present- Joint Pain. Not Present- Back Pain, Joint Stiffness, Muscle Pain, Muscle Weakness and Swelling of Extremities. Neurological Present- Weakness. Not Present- Decreased Memory, Fainting, Headaches, Numbness, Seizures, Tingling, Tremor and Trouble walking. Psychiatric Present- Depression. Not Present- Anxiety, Bipolar, Change in Sleep Pattern, Fearful and Frequent crying.  Vitals ([name] CMA; 07/11/2016  1:39 PM) 07/11/2016 1:38 PM Weight: 125 lb Height: 59in Body Surface Area: 1.51 m Body Mass Index: 25.25 kg/m  Temp.: 97.108F(Temporal)  Pulse: 73 (Regular)  BP: 130/72 (Sitting, Left Arm, Standard)      Physical Exam [name] MD; 07/11/2016 2:56 PM)  General Mental Status-Alert. General Appearance-Not in acute distress, Not Sickly. Orientation-Oriented X3. Hydration-Well hydrated. Voice-Normal.  Integumentary Global Assessment Upon inspection and palpation of skin surfaces of the - Axillae: non-tender, no inflammation or ulceration, no drainage. and Distribution of scalp and body hair is normal. General Characteristics Temperature - normal warmth is noted.  Head and Neck Head-normocephalic, atraumatic with no lesions or palpable masses. Face Global Assessment - atraumatic, no absence of expression. Neck Global Assessment - no abnormal movements, no bruit auscultated on the right, no bruit auscultated on the left, no decreased range of motion, non-tender. Trachea-midline. Thyroid Gland Characteristics - non-tender.  Eye Eyeball - Left-Extraocular movements intact, No Nystagmus. Eyeball - Right-Extraocular movements intact, No Nystagmus. Cornea - Left-No Hazy. Cornea - Right-No Hazy. Sclera/Conjunctiva - Left-No scleral icterus, No Discharge. Sclera/Conjunctiva - Right-No scleral icterus, No Discharge. Pupil - Left-Direct reaction to light normal. Pupil - Right-Direct reaction to light normal.  ENMT Ears Pinna - Left - no drainage observed, no generalized tenderness observed. Right - no drainage observed, no generalized tenderness observed. Nose and Sinuses External Inspection of the Nose - no destructive lesion observed. Inspection of the nares - Left - quiet respiration. Right - quiet respiration. Mouth and Throat Lips - Upper Lip - no fissures observed, no pallor noted. Lower Lip - no fissures observed, no pallor  noted. Nasopharynx - no discharge present. Oral Cavity/Oropharynx - Tongue - no dryness observed. Oral Mucosa - no cyanosis observed. Hypopharynx - no evidence of airway distress observed.  Chest and Lung Exam Inspection Movements - Normal and Symmetrical. Accessory muscles - No use of accessory muscles in breathing. Palpation Palpation of the chest reveals - Non-tender. Auscultation Breath sounds - Normal and Clear.  Cardiovascular Auscultation Rhythm - Regular. Murmurs & Other Heart Sounds -  Auscultation of the heart reveals - No Murmurs and No Systolic Clicks.  Abdomen Inspection Inspection of the abdomen reveals - No Visible peristalsis and No Abnormal pulsations. Umbilicus - No Bleeding, No Urine drainage. Palpation/Percussion Palpation and Percussion of the abdomen reveal - Soft, Non Tender, No Rebound tenderness, No Rigidity (guarding) and No Cutaneous hyperesthesia. Note: Right and left paramedian vertical incisions consistent with old prior laparotomies. No incisional hernias. No diastases. Minimal discomfort in right upper quadrant. No true Murphy sign. No guarding. No epigastric or left upper quadrant discomfort.  Female Genitourinary Sexual Maturity Tanner 5 - Adult hair pattern. Note: No vaginal bleeding nor discharge  Peripheral Vascular Upper Extremity Inspection - Left - No Cyanotic nailbeds, Not Ischemic. Right - No Cyanotic nailbeds, Not Ischemic.  Neurologic Neurologic evaluation reveals -normal attention span and ability to concentrate, able to name objects and repeat phrases. Appropriate fund of knowledge , normal sensation and normal coordination. Mental Status Affect - not angry, not paranoid. Cranial Nerves-Normal Bilaterally. Gait-Normal.  Neuropsychiatric Mental status exam performed with findings of-able to articulate well with normal speech/language, rate, volume and coherence, thought content normal with ability to perform basic  computations and apply abstract reasoning and no evidence of hallucinations, delusions, obsessions or homicidal/suicidal ideation.  Musculoskeletal Global Assessment Spine, Ribs and Pelvis - no instability, subluxation or laxity. Right Upper Extremity - no instability, subluxation or laxity.  Lymphatic Head & Neck  General Head & Neck Lymphatics: Bilateral - Description - No Localized lymphadenopathy. Axillary  General Axillary Region: Bilateral - Description - No Localized lymphadenopathy. Femoral & Inguinal  Generalized Femoral & Inguinal Lymphatics: Left - Description - No Localized lymphadenopathy. Right - Description - No Localized lymphadenopathy.    Assessment & Plan [name] MD; 07/11/2016 5:20 PM)  CHRONIC CHOLECYSTITIS WITH CALCULUS (K80.10) Impression: Gallstone with decreased gallbladder ejection fraction and upper abdominal pain, suspicious for chronic cholecystitis.  An emergent vague abdominal complaints primarily upper and sometimes right sided but not classic. Challenges that she has a history of Crohn's disease with for prior surgeries. That's consistent with that. CT scan emergency room noted chronic Crohn's in the distal remaining ileum but improved from a year ago and no active fistula disease or other abnormality. No evidence of obstruction.  My concern she does not have a classic history for this. No definite postprandial symptoms although seems like there are some triggers. The rest of the differential diagnosis seems unlikely. Not consistent with heartburn or reflux. No hepatitis or pancreatitis. Question of mesh in the past but has not been on any risk medication since. Consent cirrhosis on CT scan. No evidence of bowel obstruction. I did discuss with her discussing with her gastrologist but she skeptical that they will add much at this point. It does not seem like a Crohn's flare nor like heartburn or reflux to her.  She would like to avoid the risk of  future attacks since she is confident she had one 10 years ago and she's had a couple in the past month, 1 severe enough to go to the emergency room. She notes that she never gets classic symptoms and had perforated appendicitis decades ago without classic symptoms and normal white count. Therefore, I did offer cholecystectomy. Did offer to do diagnostic laparoscopy with possible lysis of adhesions. However risks of surgery to be elevated given her numerous prior surgeries. Would be useful to run the bowel if it's not to tricky. We'll work to coordinate convenient time. She was hoping for later  in the month.  Current Plans You are being scheduled for surgery - Our schedulers will call you.  You should hear from our office's scheduling department within 5 working days about the location, date, and time of surgery. We try to make accommodations for patient's preferences in scheduling surgery, but sometimes the OR schedule or the surgeon's schedule prevents [place] from making those accommodations.  If you have not heard from our office [phone]) in 5 working days, call the office and ask for your surgeon's nurse.  If you have other questions about your diagnosis, plan, or surgery, call the office and ask for your surgeon's nurse.  The anatomy & physiology of hepatobiliary & pancreatic function was discussed. The pathophysiology of gallbladder dysfunction was discussed. Natural history risks without surgery was discussed. I feel the risks of no intervention will lead to serious problems that outweigh the operative risks; therefore, I recommended cholecystectomy to remove the pathology. I explained laparoscopic techniques with possible need for an open approach. Probable cholangiogram to evaluate the bilary tract was explained as well.  Risks such as bleeding, infection, abscess, leak, injury to other organs, need for further treatment, heart attack, death, and other risks were discussed. I noted a good  likelihood this will help address the problem. Possibility that this will not correct all abdominal symptoms was explained. Goals of post-operative recovery were discussed as well. We will work to minimize complications. An educational handout further explaining the pathology and treatment options was given as well. Questions were answered. The patient expresses understanding & wishes to proceed with surgery.  Pt Education - Pamphlet Given - Laparoscopic Gallbladder Surgery: discussed with patient and provided information. Written instructions provided Pt Education - [facility] Laparosopic Post Op HCI (Riyad Keena) Pt Education - [facility] Good Bowel Health (Mardy Hoppe) Pt Education - Laparoscopic Cholecystectomy: gallbladder CROHN'S DISEASE WITHOUT COMPLICATION, UNSPECIFIED GASTROINTESTINAL TRACT LOCATION (K50.90) Impression: Chronic Crohn disease with some chronic inflammation in distal ileum. However improve from a year ago. Off all medications given liver issues. Doubt fistular obstruction. Can look at small bowel on diagnostic laparoscopy.  [name], M.D., F.A.C.S. Gastrointestinal and Minimally Invasive Surgery [facility] [address] [place], [place] [address] [phone] Main / Paging

## 2016-08-05 ENCOUNTER — Encounter (HOSPITAL_COMMUNITY)
Admission: RE | Admit: 2016-08-05 | Discharge: 2016-08-05 | Disposition: A | Discharge status: Home / Self Care | Payer: Medicare Other | Source: Ambulatory Visit | Attending: Surgery | Admitting: Surgery

## 2016-08-05 ENCOUNTER — Encounter (HOSPITAL_COMMUNITY): Payer: Self-pay

## 2016-08-05 DIAGNOSIS — Z01810 Encounter for preprocedural cardiovascular examination: Secondary | ICD-10-CM | POA: Diagnosis not present

## 2016-08-05 DIAGNOSIS — I10 Essential (primary) hypertension: Secondary | ICD-10-CM | POA: Diagnosis not present

## 2016-08-05 DIAGNOSIS — K8010 Calculus of gallbladder with chronic cholecystitis without obstruction: Secondary | ICD-10-CM | POA: Insufficient documentation

## 2016-08-05 DIAGNOSIS — Z01812 Encounter for preprocedural laboratory examination: Secondary | ICD-10-CM | POA: Diagnosis present

## 2016-08-05 HISTORY — DX: Unspecified osteoarthritis, unspecified site: M19.90

## 2016-08-05 HISTORY — DX: Malignant (primary) neoplasm, unspecified: C80.1

## 2016-08-05 LAB — CBC
HCT: 41.2 % (ref 36.0–46.0)
Hemoglobin: 13.3 g/dL (ref 12.0–15.0)
MCH: 29.8 pg (ref 26.0–34.0)
MCHC: 32.3 g/dL (ref 30.0–36.0)
MCV: 92.4 fL (ref 78.0–100.0)
PLATELETS: 247 10*3/uL (ref 150–400)
RBC: 4.46 MIL/uL (ref 3.87–5.11)
RDW: 14 % (ref 11.5–15.5)
WBC: 9.8 10*3/uL (ref 4.0–10.5)

## 2016-08-05 LAB — BASIC METABOLIC PANEL
Anion gap: 9 (ref 5–15)
BUN: 21 mg/dL — AB (ref 6–20)
CALCIUM: 9.2 mg/dL (ref 8.9–10.3)
CHLORIDE: 105 mmol/L (ref 101–111)
CO2: 27 mmol/L (ref 22–32)
CREATININE: 1.37 mg/dL — AB (ref 0.44–1.00)
GFR calc non Af Amer: 39 mL/min — ABNORMAL LOW (ref >60)
GFR, EST AFRICAN AMERICAN: 45 mL/min — AB (ref >60)
GLUCOSE: 153 mg/dL — AB (ref 65–99)
Potassium: 3.7 mmol/L (ref 3.5–5.1)
Sodium: 141 mmol/L (ref 135–145)

## 2016-08-05 NOTE — Pre-Procedure Instructions (Signed)
2V CXR 06-12-16 epic

## 2016-08-05 NOTE — Patient Instructions (Addendum)
Alexis Ramos  08/05/2016   Your procedure is scheduled on: 08/10/16  Report to [facility] Main  Entrance take [facility]  elevators to 3rd floor to  [place] at 6:30AM.  Call this number if you have problems the morning of surgery [phone]   Remember: ONLY 1 PERSON MAY GO WITH YOU TO SHORT STAY TO GET  READY MORNING OF Alexis Ramos.  Do not eat food or drink liquids :After Midnight.     Take these medicines the morning of surgery with A SIP OF WATER: Amlodipine (Norvasc), Metoprolol, Venlafaxine (Effexor).                               You may not have any metal on your body including hair pins and              piercings  Do not wear jewelry, make-up, lotions, powders or perfumes, deodorant             Do not wear nail polish.  Do not shave  48 hours prior to surgery.              Men may shave face and neck.   Do not bring valuables to the hospital. [facility].  Contacts, dentures or bridgework may not be worn into surgery.  Leave suitcase in the car. After surgery it may be brought to your room.     Patients discharged the day of surgery will not be allowed to drive home.  Name and phone number of your driver: Kelle Darting [phone]              Please read over the following fact sheets you were given: _____________________________________________________________________             [facility] - Preparing for Surgery Before surgery, you can play an important role.  Because skin is not sterile, your skin needs to be as free of germs as possible.  You can reduce the number of germs on your skin by washing with CHG (chlorahexidine gluconate) soap before surgery.  CHG is an antiseptic cleaner which kills germs and bonds with the skin to continue killing germs even after washing. Please DO NOT use if you have an allergy to CHG or antibacterial soaps.  If your skin becomes reddened/irritated stop  using the CHG and inform your nurse when you arrive at Short Stay. Do not shave (including legs and underarms) for at least 48 hours prior to the first CHG shower.  You may shave your face/neck. Please follow these instructions carefully:  1.  Shower with CHG Soap the night before surgery and the  morning of Surgery.  2.  If you choose to wash your hair, wash your hair first as usual with your  normal  shampoo.  3.  After you shampoo, rinse your hair and body thoroughly to remove the  shampoo.                           4.  Use CHG as you would any other liquid soap.  You can apply chg directly  to the skin and wash  Gently with a scrungie or clean washcloth.  5.  Apply the CHG Soap to your body ONLY FROM THE NECK DOWN.   Do not use on face/ open                           Wound or open sores. Avoid contact with eyes, ears mouth and genitals (private parts).                       Wash face,  Genitals (private parts) with your normal soap.             6.  Wash thoroughly, paying special attention to the area where your surgery  will be performed.  7.  Thoroughly rinse your body with warm water from the neck down.  8.  DO NOT shower/wash with your normal soap after using and rinsing off  the CHG Soap.                9.  Pat yourself dry with a clean towel.            10.  Wear clean pajamas.            11.  Place clean sheets on your bed the night of your first shower and do not  sleep with pets. Day of Surgery : Do not apply any lotions/deodorants the morning of surgery.  Please wear clean clothes to the hospital/surgery center.  FAILURE TO FOLLOW THESE INSTRUCTIONS MAY RESULT IN THE CANCELLATION OF YOUR SURGERY PATIENT SIGNATURE_________________________________  NURSE SIGNATURE__________________________________  ________________________________________________________________________

## 2016-08-09 NOTE — Anesthesia Preprocedure Evaluation (Addendum)
Anesthesia Evaluation  Patient identified by MRN, date of birth, ID band Patient awake    Reviewed: Allergy & Precautions, NPO status , Patient's Chart, lab work & pertinent test results, reviewed documented beta blocker date and time   History of Anesthesia Complications (+) PONV and history of anesthetic complications  Airway Mallampati: III  TM Distance: >3 FB Neck ROM: Full   Comment: H/o neck surgery Dental  (+) Edentulous Upper, Caps   Pulmonary neg pulmonary ROS, neg shortness of breath, neg sleep apnea, neg COPD, neg recent URI,    Pulmonary exam normal breath sounds clear to auscultation       Cardiovascular hypertension, Pt. on medications and Pt. on home beta blockers (-) angina(-) Past MI, (-) Cardiac Stents, (-) CABG and (-) Orthopnea (-) dysrhythmias  Rhythm:Regular Rate:Normal     Neuro/Psych neg Seizures PSYCHIATRIC DISORDERS Depression negative neurological ROS     GI/Hepatic neg GERD  ,H/o NASH Crohn's disease, cholelithiasis   Endo/Other  negative endocrine ROSneg diabetes  Renal/GU CRFRenal disease     Musculoskeletal  (+) Arthritis ,   Abdominal   Peds  Hematology negative hematology ROS (+)   Anesthesia Other Findings   Reproductive/Obstetrics                            Anesthesia Physical Anesthesia Plan  ASA: III  Anesthesia Plan: General   Post-op Pain Management:    Induction: Intravenous  Airway Management Planned: Oral ETT and Video Laryngoscope Planned  Additional Equipment:   Intra-op Plan:   Post-operative Plan: Extubation in OR  Informed Consent: I have reviewed the patients History and Physical, chart, labs and discussed the procedure including the risks, benefits and alternatives for the proposed anesthesia with the patient or authorized representative who has indicated his/her understanding and acceptance.   Dental advisory given  Plan  Discussed with:   Anesthesia Plan Comments: (Risks of general anesthesia discussed including, but not limited to, sore throat, hoarse voice, chipped/damaged teeth, injury to vocal cords, nausea and vomiting, allergic reactions, lung infection, heart attack, stroke, and death. All questions answered. )      Anesthesia Quick Evaluation

## 2016-08-10 ENCOUNTER — Ambulatory Visit (HOSPITAL_COMMUNITY): Payer: Medicare Other | Admitting: Anesthesiology

## 2016-08-10 ENCOUNTER — Observation Stay (HOSPITAL_COMMUNITY)
Admission: RE | Admit: 2016-08-10 | Discharge: 2016-08-11 | Disposition: A | Discharge status: Home / Self Care | Payer: Medicare Other | Source: Ambulatory Visit | Attending: Surgery | Admitting: Surgery

## 2016-08-10 ENCOUNTER — Encounter (HOSPITAL_COMMUNITY)
Admission: RE | Disposition: A | Discharge status: Home / Self Care | Payer: Self-pay | Source: Ambulatory Visit | Attending: Surgery

## 2016-08-10 ENCOUNTER — Ambulatory Visit (HOSPITAL_COMMUNITY): Payer: Medicare Other

## 2016-08-10 ENCOUNTER — Encounter (HOSPITAL_COMMUNITY): Payer: Self-pay | Admitting: *Deleted

## 2016-08-10 DIAGNOSIS — K740 Hepatic fibrosis: Secondary | ICD-10-CM | POA: Diagnosis not present

## 2016-08-10 DIAGNOSIS — I10 Essential (primary) hypertension: Secondary | ICD-10-CM | POA: Insufficient documentation

## 2016-08-10 DIAGNOSIS — K5090 Crohn's disease, unspecified, without complications: Secondary | ICD-10-CM | POA: Diagnosis not present

## 2016-08-10 DIAGNOSIS — K811 Chronic cholecystitis: Principal | ICD-10-CM | POA: Insufficient documentation

## 2016-08-10 DIAGNOSIS — K829 Disease of gallbladder, unspecified: Secondary | ICD-10-CM

## 2016-08-10 DIAGNOSIS — Z9049 Acquired absence of other specified parts of digestive tract: Secondary | ICD-10-CM | POA: Insufficient documentation

## 2016-08-10 DIAGNOSIS — Z79899 Other long term (current) drug therapy: Secondary | ICD-10-CM | POA: Insufficient documentation

## 2016-08-10 DIAGNOSIS — K7581 Nonalcoholic steatohepatitis (NASH): Secondary | ICD-10-CM | POA: Diagnosis not present

## 2016-08-10 DIAGNOSIS — Z85820 Personal history of malignant melanoma of skin: Secondary | ICD-10-CM | POA: Diagnosis not present

## 2016-08-10 DIAGNOSIS — F329 Major depressive disorder, single episode, unspecified: Secondary | ICD-10-CM | POA: Insufficient documentation

## 2016-08-10 DIAGNOSIS — K660 Peritoneal adhesions (postprocedural) (postinfection): Secondary | ICD-10-CM | POA: Diagnosis not present

## 2016-08-10 DIAGNOSIS — K8010 Calculus of gallbladder with chronic cholecystitis without obstruction: Secondary | ICD-10-CM

## 2016-08-10 DIAGNOSIS — K8050 Calculus of bile duct without cholangitis or cholecystitis without obstruction: Secondary | ICD-10-CM | POA: Diagnosis present

## 2016-08-10 HISTORY — PX: LAPAROSCOPIC LYSIS OF ADHESIONS: SHX5905

## 2016-08-10 HISTORY — PX: CHOLECYSTECTOMY: SHX55

## 2016-08-10 SURGERY — LYSIS, ADHESIONS, LAPAROSCOPIC
Anesthesia: General | Site: Abdomen

## 2016-08-10 MED ORDER — FENTANYL CITRATE (PF) 100 MCG/2ML IJ SOLN
25.0000 ug | INTRAMUSCULAR | Status: DC | PRN
  Administered 2016-08-10 (×2): 12.5 ug via INTRAVENOUS

## 2016-08-10 MED ORDER — LIP MEDEX EX OINT
1.0000 "application " | TOPICAL_OINTMENT | Freq: Two times a day (BID) | CUTANEOUS | Status: DC
  Administered 2016-08-10 – 2016-08-11 (×2): 1 via TOPICAL

## 2016-08-10 MED ORDER — DIPHENHYDRAMINE HCL 50 MG/ML IJ SOLN: 12.5000 mg | Freq: Four times a day (QID) | INTRAMUSCULAR | Status: DC | PRN

## 2016-08-10 MED ORDER — LACTATED RINGERS IV BOLUS (SEPSIS): 1000.0000 mL | Freq: Three times a day (TID) | INTRAVENOUS | Status: DC | PRN

## 2016-08-10 MED ORDER — LOSARTAN POTASSIUM 50 MG PO TABS
100.0000 mg | ORAL_TABLET | Freq: Every day | ORAL | Status: DC
  Administered 2016-08-10 – 2016-08-11 (×2): 100 mg via ORAL
  Filled 2016-08-10 (×2): qty 2

## 2016-08-10 MED ORDER — DIPHENHYDRAMINE HCL 12.5 MG/5ML PO ELIX: 12.5000 mg | ORAL_SOLUTION | Freq: Four times a day (QID) | ORAL | Status: DC | PRN

## 2016-08-10 MED ORDER — ONDANSETRON HCL 4 MG/2ML IJ SOLN
INTRAMUSCULAR | Status: DC | PRN
  Administered 2016-08-10: 4 mg via INTRAVENOUS

## 2016-08-10 MED ORDER — ALUM & MAG HYDROXIDE-SIMETH 200-200-20 MG/5ML PO SUSP: 30.0000 mL | Freq: Four times a day (QID) | ORAL | Status: DC | PRN

## 2016-08-10 MED ORDER — EPHEDRINE 5 MG/ML INJ
INTRAVENOUS | Status: AC
  Filled 2016-08-10: qty 10

## 2016-08-10 MED ORDER — SIMETHICONE 80 MG PO CHEW: 40.0000 mg | CHEWABLE_TABLET | Freq: Four times a day (QID) | ORAL | Status: DC | PRN

## 2016-08-10 MED ORDER — TRAMADOL HCL 50 MG PO TABS: 50.0000 mg | ORAL_TABLET | Freq: Four times a day (QID) | ORAL | 0 refills | Status: DC | PRN

## 2016-08-10 MED ORDER — METOPROLOL TARTRATE 5 MG/5ML IV SOLN: 5.0000 mg | Freq: Four times a day (QID) | INTRAVENOUS | Status: DC | PRN

## 2016-08-10 MED ORDER — HYDROMORPHONE HCL 1 MG/ML IJ SOLN: 0.5000 mg | INTRAMUSCULAR | Status: DC | PRN

## 2016-08-10 MED ORDER — ONDANSETRON 4 MG PO TBDP
4.0000 mg | ORAL_TABLET | Freq: Four times a day (QID) | ORAL | Status: DC | PRN
Start: 2016-08-10 — End: 2016-08-11

## 2016-08-10 MED ORDER — METHOCARBAMOL 500 MG PO TABS: 500.0000 mg | ORAL_TABLET | Freq: Four times a day (QID) | ORAL | Status: DC | PRN

## 2016-08-10 MED ORDER — ACETAMINOPHEN 325 MG PO TABS: 650.0000 mg | ORAL_TABLET | Freq: Four times a day (QID) | ORAL | Status: DC | PRN

## 2016-08-10 MED ORDER — MIDAZOLAM HCL 5 MG/5ML IJ SOLN
INTRAMUSCULAR | Status: DC | PRN
  Administered 2016-08-10 (×2): 1 mg via INTRAVENOUS

## 2016-08-10 MED ORDER — ACETAMINOPHEN 500 MG PO TABS
1000.0000 mg | ORAL_TABLET | ORAL | Status: AC
  Administered 2016-08-10: 1000 mg via ORAL
  Filled 2016-08-10: qty 2

## 2016-08-10 MED ORDER — DEXAMETHASONE SODIUM PHOSPHATE 10 MG/ML IJ SOLN
INTRAMUSCULAR | Status: AC
  Filled 2016-08-10: qty 1

## 2016-08-10 MED ORDER — 0.9 % SODIUM CHLORIDE (POUR BTL) OPTIME
TOPICAL | Status: DC | PRN
  Administered 2016-08-10: 1000 mL

## 2016-08-10 MED ORDER — PROPOFOL 10 MG/ML IV BOLUS
INTRAVENOUS | Status: AC
  Filled 2016-08-10: qty 20

## 2016-08-10 MED ORDER — GENTAMICIN SULFATE 40 MG/ML IJ SOLN
5.0000 mg/kg | INTRAVENOUS | Status: AC
  Administered 2016-08-10: 283.6 mg via INTRAVENOUS
  Filled 2016-08-10: qty 7

## 2016-08-10 MED ORDER — LACTATED RINGERS IR SOLN
Status: DC | PRN
  Administered 2016-08-10: 1000 mL

## 2016-08-10 MED ORDER — SCOPOLAMINE 1 MG/3DAYS TD PT72
1.0000 | MEDICATED_PATCH | TRANSDERMAL | Status: DC
  Administered 2016-08-10: 1 via TRANSDERMAL

## 2016-08-10 MED ORDER — LIDOCAINE 2% (20 MG/ML) 5 ML SYRINGE
INTRAMUSCULAR | Status: AC
  Filled 2016-08-10: qty 5

## 2016-08-10 MED ORDER — LIDOCAINE HCL (CARDIAC) 20 MG/ML IV SOLN
INTRAVENOUS | Status: DC | PRN
  Administered 2016-08-10: 100 mg via INTRAVENOUS

## 2016-08-10 MED ORDER — IOPAMIDOL (ISOVUE-300) INJECTION 61%
INTRAVENOUS | Status: AC
  Filled 2016-08-10: qty 50

## 2016-08-10 MED ORDER — ENOXAPARIN SODIUM 40 MG/0.4ML ~~LOC~~ SOLN
40.0000 mg | SUBCUTANEOUS | Status: DC
  Administered 2016-08-11: 40 mg via SUBCUTANEOUS
  Filled 2016-08-10: qty 0.4

## 2016-08-10 MED ORDER — ROCURONIUM BROMIDE 100 MG/10ML IV SOLN
INTRAVENOUS | Status: DC | PRN
  Administered 2016-08-10: 30 mg via INTRAVENOUS

## 2016-08-10 MED ORDER — BUPIVACAINE-EPINEPHRINE 0.25% -1:200000 IJ SOLN
INTRAMUSCULAR | Status: DC | PRN
  Administered 2016-08-10: 50 mL

## 2016-08-10 MED ORDER — ONDANSETRON HCL 4 MG/2ML IJ SOLN: 4.0000 mg | Freq: Four times a day (QID) | INTRAMUSCULAR | Status: DC | PRN

## 2016-08-10 MED ORDER — SCOPOLAMINE 1 MG/3DAYS TD PT72
MEDICATED_PATCH | TRANSDERMAL | Status: AC
  Filled 2016-08-10: qty 1

## 2016-08-10 MED ORDER — FENTANYL CITRATE (PF) 100 MCG/2ML IJ SOLN
INTRAMUSCULAR | Status: DC | PRN
Start: 2016-08-10 — End: 2016-08-10
  Administered 2016-08-10 (×2): 25 ug via INTRAVENOUS
  Administered 2016-08-10: 50 ug via INTRAVENOUS

## 2016-08-10 MED ORDER — PHENYLEPHRINE 40 MCG/ML (10ML) SYRINGE FOR IV PUSH (FOR BLOOD PRESSURE SUPPORT)
PREFILLED_SYRINGE | INTRAVENOUS | Status: AC
  Filled 2016-08-10: qty 10

## 2016-08-10 MED ORDER — PHENOL 1.4 % MT LIQD: 2.0000 | OROMUCOSAL | Status: DC | PRN

## 2016-08-10 MED ORDER — MENTHOL 3 MG MT LOZG: 1.0000 | LOZENGE | OROMUCOSAL | Status: DC | PRN

## 2016-08-10 MED ORDER — HYDRALAZINE HCL 20 MG/ML IJ SOLN: 10.0000 mg | INTRAMUSCULAR | Status: DC | PRN

## 2016-08-10 MED ORDER — IOPAMIDOL (ISOVUE-300) INJECTION 61%
INTRAVENOUS | Status: DC | PRN
  Administered 2016-08-10: 3 mL via INTRAVENOUS

## 2016-08-10 MED ORDER — SUCCINYLCHOLINE CHLORIDE 20 MG/ML IJ SOLN
INTRAMUSCULAR | Status: DC | PRN
  Administered 2016-08-10: 80 mg via INTRAVENOUS

## 2016-08-10 MED ORDER — PROPOFOL 10 MG/ML IV BOLUS
INTRAVENOUS | Status: DC | PRN
  Administered 2016-08-10: 110 mg via INTRAVENOUS

## 2016-08-10 MED ORDER — ACETAMINOPHEN 500 MG PO TABS
1000.0000 mg | ORAL_TABLET | Freq: Three times a day (TID) | ORAL | Status: DC
  Administered 2016-08-10 – 2016-08-11 (×4): 1000 mg via ORAL
  Filled 2016-08-10 (×4): qty 2

## 2016-08-10 MED ORDER — MAGIC MOUTHWASH
15.0000 mL | Freq: Four times a day (QID) | ORAL | Status: DC | PRN
  Filled 2016-08-10: qty 15

## 2016-08-10 MED ORDER — GABAPENTIN 300 MG PO CAPS
300.0000 mg | ORAL_CAPSULE | ORAL | Status: AC
  Administered 2016-08-10: 300 mg via ORAL
  Filled 2016-08-10: qty 1

## 2016-08-10 MED ORDER — GENTAMICIN IN SALINE 1-0.9 MG/ML-% IV SOLN: 100.0000 mg | INTRAVENOUS | Status: DC

## 2016-08-10 MED ORDER — METOPROLOL SUCCINATE ER 25 MG PO TB24
25.0000 mg | ORAL_TABLET | Freq: Every day | ORAL | Status: DC
  Administered 2016-08-11: 25 mg via ORAL
  Filled 2016-08-10: qty 1

## 2016-08-10 MED ORDER — CHLORHEXIDINE GLUCONATE CLOTH 2 % EX PADS: 6.0000 | MEDICATED_PAD | Freq: Once | CUTANEOUS | Status: DC

## 2016-08-10 MED ORDER — MIDAZOLAM HCL 2 MG/2ML IJ SOLN
INTRAMUSCULAR | Status: AC
  Filled 2016-08-10: qty 2

## 2016-08-10 MED ORDER — LACTATED RINGERS IV SOLN
INTRAVENOUS | Status: DC
  Administered 2016-08-10: 50 mL/h via INTRAVENOUS
  Administered 2016-08-11: 10:00:00 via INTRAVENOUS

## 2016-08-10 MED ORDER — SUGAMMADEX SODIUM 200 MG/2ML IV SOLN
INTRAVENOUS | Status: DC | PRN
  Administered 2016-08-10: 150 mg via INTRAVENOUS

## 2016-08-10 MED ORDER — PROCHLORPERAZINE EDISYLATE 5 MG/ML IJ SOLN: 5.0000 mg | INTRAMUSCULAR | Status: DC | PRN

## 2016-08-10 MED ORDER — ACETAMINOPHEN 650 MG RE SUPP: 650.0000 mg | Freq: Four times a day (QID) | RECTAL | Status: DC | PRN

## 2016-08-10 MED ORDER — SACCHAROMYCES BOULARDII 250 MG PO CAPS
250.0000 mg | ORAL_CAPSULE | Freq: Two times a day (BID) | ORAL | Status: DC
  Administered 2016-08-10 – 2016-08-11 (×2): 250 mg via ORAL
  Filled 2016-08-10 (×3): qty 1

## 2016-08-10 MED ORDER — DEXAMETHASONE SODIUM PHOSPHATE 10 MG/ML IJ SOLN
INTRAMUSCULAR | Status: DC | PRN
  Administered 2016-08-10: 10 mg via INTRAVENOUS

## 2016-08-10 MED ORDER — PROMETHAZINE HCL 25 MG/ML IJ SOLN: 6.2500 mg | INTRAMUSCULAR | Status: DC | PRN

## 2016-08-10 MED ORDER — LOPERAMIDE HCL 2 MG PO CAPS: 2.0000 mg | ORAL_CAPSULE | Freq: Three times a day (TID) | ORAL | Status: DC | PRN

## 2016-08-10 MED ORDER — FENTANYL CITRATE (PF) 250 MCG/5ML IJ SOLN
INTRAMUSCULAR | Status: AC
Start: 2016-08-10 — End: 2016-08-10
  Filled 2016-08-10: qty 5

## 2016-08-10 MED ORDER — PHENYLEPHRINE HCL 10 MG/ML IJ SOLN
INTRAMUSCULAR | Status: DC | PRN
  Administered 2016-08-10: 40 ug via INTRAVENOUS
  Administered 2016-08-10 (×3): 80 ug via INTRAVENOUS

## 2016-08-10 MED ORDER — FENTANYL CITRATE (PF) 100 MCG/2ML IJ SOLN
INTRAMUSCULAR | Status: AC
  Administered 2016-08-10: 12.5 ug via INTRAVENOUS
  Filled 2016-08-10: qty 2

## 2016-08-10 MED ORDER — ROCURONIUM BROMIDE 10 MG/ML (PF) SYRINGE
PREFILLED_SYRINGE | INTRAVENOUS | Status: AC
  Filled 2016-08-10: qty 10

## 2016-08-10 MED ORDER — BUPIVACAINE-EPINEPHRINE 0.25% -1:200000 IJ SOLN
INTRAMUSCULAR | Status: AC
  Filled 2016-08-10: qty 1

## 2016-08-10 MED ORDER — LACTATED RINGERS IV SOLN
INTRAVENOUS | Status: DC | PRN
  Administered 2016-08-10: 08:00:00 via INTRAVENOUS

## 2016-08-10 MED ORDER — CLINDAMYCIN PHOSPHATE 900 MG/50ML IV SOLN
INTRAVENOUS | Status: AC
  Filled 2016-08-10: qty 50

## 2016-08-10 MED ORDER — EPHEDRINE SULFATE 50 MG/ML IJ SOLN
INTRAMUSCULAR | Status: DC | PRN
  Administered 2016-08-10: 5 mg via INTRAVENOUS
  Administered 2016-08-10 (×2): 10 mg via INTRAVENOUS

## 2016-08-10 MED ORDER — VENLAFAXINE HCL 75 MG PO TABS
75.0000 mg | ORAL_TABLET | Freq: Two times a day (BID) | ORAL | Status: DC
  Administered 2016-08-11 (×2): 75 mg via ORAL
  Filled 2016-08-10 (×3): qty 1

## 2016-08-10 MED ORDER — CLINDAMYCIN PHOSPHATE 900 MG/50ML IV SOLN
900.0000 mg | INTRAVENOUS | Status: AC
Start: 2016-08-10 — End: 2016-08-10
  Administered 2016-08-10: 900 mg via INTRAVENOUS
  Filled 2016-08-10: qty 50

## 2016-08-10 MED ORDER — ONDANSETRON HCL 4 MG/2ML IJ SOLN
INTRAMUSCULAR | Status: AC
  Filled 2016-08-10: qty 2

## 2016-08-10 MED ORDER — OXYCODONE HCL 5 MG PO TABS
5.0000 mg | ORAL_TABLET | ORAL | Status: DC | PRN
  Administered 2016-08-10 – 2016-08-11 (×2): 5 mg via ORAL
  Filled 2016-08-10 (×2): qty 1

## 2016-08-10 MED ORDER — METOPROLOL TARTRATE 12.5 MG HALF TABLET: 12.5000 mg | ORAL_TABLET | Freq: Two times a day (BID) | ORAL | Status: DC | PRN

## 2016-08-10 SURGICAL SUPPLY — 39 items
APPLIER CLIP 5 13 M/L LIGAMAX5 (MISCELLANEOUS)
APPLIER CLIP ROT 10 11.4 M/L (STAPLE)
CABLE HIGH FREQUENCY MONO STRZ (ELECTRODE) IMPLANT
CLIP APPLIE 5 13 M/L LIGAMAX5 (MISCELLANEOUS) IMPLANT
CLIP APPLIE ROT 10 11.4 M/L (STAPLE) IMPLANT
COVER MAYO STAND STRL (DRAPES) ×3 IMPLANT
COVER SURGICAL LIGHT HANDLE (MISCELLANEOUS) ×3 IMPLANT
DECANTER SPIKE VIAL GLASS SM (MISCELLANEOUS) ×3 IMPLANT
DRAPE C-ARM 42X120 X-RAY (DRAPES) ×3 IMPLANT
DRAPE LAPAROSCOPIC ABDOMINAL (DRAPES) ×3 IMPLANT
DRAPE UTILITY XL STRL (DRAPES) ×3 IMPLANT
DRAPE WARM FLUID 44X44 (DRAPE) ×3 IMPLANT
DRSG TEGADERM 2-3/8X2-3/4 SM (GAUZE/BANDAGES/DRESSINGS) ×9 IMPLANT
DRSG TEGADERM 4X4.75 (GAUZE/BANDAGES/DRESSINGS) ×3 IMPLANT
DRSG TELFA 3X8 NADH (GAUZE/BANDAGES/DRESSINGS) ×3 IMPLANT
ELECT REM PT RETURN 9FT ADLT (ELECTROSURGICAL) ×3
ELECTRODE REM PT RTRN 9FT ADLT (ELECTROSURGICAL) ×1 IMPLANT
ENDOLOOP SUT PDS II  0 18 (SUTURE)
ENDOLOOP SUT PDS II 0 18 (SUTURE) IMPLANT
GLOVE ECLIPSE 8.0 STRL XLNG CF (GLOVE) ×3 IMPLANT
GLOVE INDICATOR 8.0 STRL GRN (GLOVE) ×3 IMPLANT
GOWN STRL REUS W/TWL XL LVL3 (GOWN DISPOSABLE) ×6 IMPLANT
IRRIG SUCT STRYKERFLOW 2 WTIP (MISCELLANEOUS) ×3
IRRIGATION SUCT STRKRFLW 2 WTP (MISCELLANEOUS) ×1 IMPLANT
KIT BASIN OR (CUSTOM PROCEDURE TRAY) ×3 IMPLANT
NEEDLE BIOPSY 14X6 SOFT TISS (NEEDLE) ×3 IMPLANT
POSITIONER SURGICAL ARM (MISCELLANEOUS) IMPLANT
POUCH SPECIMEN RETRIEVAL 10MM (ENDOMECHANICALS) IMPLANT
SCISSORS LAP 5X35 DISP (ENDOMECHANICALS) ×3 IMPLANT
SET CHOLANGIOGRAPH MIX (MISCELLANEOUS) ×3 IMPLANT
SLEEVE XCEL OPT CAN 5 100 (ENDOMECHANICALS) IMPLANT
SUT MNCRL AB 4-0 PS2 18 (SUTURE) ×3 IMPLANT
SYR 20CC LL (SYRINGE) ×3 IMPLANT
TOWEL OR 17X26 10 PK STRL BLUE (TOWEL DISPOSABLE) ×3 IMPLANT
TOWEL OR NON WOVEN STRL DISP B (DISPOSABLE) ×3 IMPLANT
TRAY LAPAROSCOPIC (CUSTOM PROCEDURE TRAY) ×3 IMPLANT
TROCAR BLADELESS OPT 5 100 (ENDOMECHANICALS) ×3 IMPLANT
TROCAR XCEL NON-BLD 11X100MML (ENDOMECHANICALS) ×3 IMPLANT
TUBING INSUF HEATED (TUBING) IMPLANT

## 2016-08-10 NOTE — Anesthesia Postprocedure Evaluation (Signed)
Anesthesia Post Note  Patient: Alexis Ramos  Procedure(s) Performed: Procedure(s) (LRB): LAPAROSCOPIC LYSIS OF ADHESIONS WITH CORE LIVER BIOPSY (N/A) LAPAROSCOPIC CHOLECYSTECTOMY WITH INTRAOPERATIVE CHOLANGIOGRAM -STANDARD 4 PORT (N/A)  Patient location during evaluation: PACU Anesthesia Type: General Level of consciousness: awake and alert Pain management: pain level controlled Vital Signs Assessment: post-procedure vital signs reviewed and stable Respiratory status: spontaneous breathing, nonlabored ventilation, respiratory function stable and patient connected to nasal cannula oxygen Cardiovascular status: blood pressure returned to baseline and stable Postop Assessment: no signs of nausea or vomiting Anesthetic complications: no Comments: Patient was sedated upon initial presentation to the PACU but arousable. She was briefly on 100% non-rebreather. Scopolamine patch was removed. Patient had good air movement upon auscultation and was following commands. On discharge from PACU, patient is more alert and is maintaining her oxygen saturation on 3L nasal cannula.    Last Vitals:  Vitals:   08/10/16 1130 08/10/16 1145  BP: 129/82 122/69  Pulse: 65 65  Resp: 18 16  Temp:      Last Pain:  Vitals:   08/10/16 1145  TempSrc:   PainSc: 6                  Nilda Simmer

## 2016-08-10 NOTE — Op Note (Signed)
08/10/2016  PATIENT:  Alexis Ramos  65 y.o. female  Patient Care Team: [name], MD as PCP - General (Internal Medicine) [name], MD as Consulting Physician (Gastroenterology) [name], MD as Consulting Physician (General Surgery)  PRE-OPERATIVE DIAGNOSIS:    Symptomatic biliary colic, probable chronic cholecystitis H/o Crohn's disease Possible cirhhosis  POST-OPERATIVE DIAGNOSIS:    Chronic cholecystitis H/o Crohn's disease Possible cirhhosis  PROCEDURE:    LAPAROSCOPIC LYSIS OF ADHESIONS x 1 hour LAPAROSCOPIC CHOLECYSTECTOMY WITH INTRAOPERATIVE CHOLANGIOGRAM -STANDARD 4 PORT CORE LIVER BIOPSY GASTRIC REPAIR  SURGEON:  Surgeon(s): [name], MD  ASSISTANT: RN   ANESTHESIA:   local and general  EBL:  Total I/O In: -  Out: 20 [Blood:20]  Delay start of Pharmacological VTE agent (>24hrs) due to surgical blood loss or risk of bleeding:  no  DRAINS: none   SPECIMEN:  Source of Specimen:    1.  Gallbladder 2.  Right hepatic lobe core liver biopsies  DISPOSITION OF SPECIMEN:  PATHOLOGY  COUNTS:  YES  PLAN OF CARE: Admit for overnight observation  PATIENT DISPOSITION:  PACU - hemodynamically stable.  INDICATION: Pleasant woman history of Crohn's disease and irregular loose bowels.  Intermittent episodes of abdominal pain suspicious for chronic cholecystitis although not classic presentation.  Decreased gallbladder ejection fraction.  Possible gallstone.  No active Crohn's disease.  No obvious obstruction.  Recommendation made for diagnostic laparoscopy, lysis of adhesions, cholecystectomy.  The anatomy & physiology of hepatobiliary & pancreatic function was discussed.  The pathophysiology of gallbladder dysfunction was discussed.  Natural history risks without surgery was discussed.   I feel the risks of no intervention will lead to serious problems that outweigh the operative risks; therefore, I recommended cholecystectomy to remove the  pathology.  I explained laparoscopic techniques with possible need for an open approach.  Probable cholangiogram to evaluate the bilary tract was explained as well.  Lysis adhesions given her numerous prior resections and surgeries.  Possible core liver biopsy given chronic change in the liver  Risks such as bleeding, infection, abscess, leak, injury to other organs, need for further treatment, heart attack, death, and other risks were discussed.  I noted a good likelihood this will help address the problem.  Possibility that this will not correct all abdominal symptoms was explained.  Goals of post-operative recovery were discussed as well.  We will work to minimize complications.  An educational handout further explaining the pathology and treatment options was given as well.  Questions were answered.  The patient expresses understanding & wishes to proceed with surgery.  OR FINDINGS: Moderately dense adhesions of primarily omentum but some stomach and small bowel as well Dense adhesions and right lower quadrant consistent with prior ileocolonic resections.  However no obvious major inflammation.  Small bowel nondilated proximally.  Left in situ.  Very dilated boggy gallbladder was very thin wall consistent with chronic end-stage cholecystitis/bladder dyskinesia. Intact biliary system without any abnormalities on cholangiogram.  Some lobulations of the liver suspicious for early cirrhosis versus fatty change.  Core biopsies done.  DESCRIPTION:   The patient was identified & brought into the operating room. The patient was positioned supine with arms tucked. SCDs were active during the entire case. The patient underwent general anesthesia without any difficulty.  The abdomen was prepped and draped in a sterile fashion. A Surgical Timeout confirmed our plan.  We positioned the patient in reverse [place] & right side up.  I placed a 79m laparoscopic port through the abdominal wall  using optical  entry technique in the left upper quadrant.   We induced carbon dioxide insufflation. Camera inspection revealed no injury.  Patient had moderately dense adhesions to the anterior wall consistent with her prior left paramedian and midline incisions and bowel resections in the past.  Proceeded with lysis adhesions to free primarily omentum but also stomach and some small bowel off the anterior abdominal wall.  Did inspection and repaired an 59m area of the serosa on the antrum along the greater curvature of the stomach and tacked some omentum on an as well for an omentopexy since she had very thin fragile tissues.  Used 3-0 Vicryl interrupted sutures to close and tack omentum for an omentopexy.  She had very thinned out tissues.  She did have some adhesions in the ileocolonic region of the right lower quadrant.  I freed some around that area but because she had very thin tissue, held off on doing more aggressive dissection.  Action of the region did not show an obvious inflamed colon or small bowel.  Investigation of the small bowel proximal to this region showed that it was not dilated and looked healthy.  No heart evidence of obstruction or abscess or perforation or fistula.  Therefore I left that area alone since she had very thin poor tissues.  I turned attention to the right upper quadrant.  The gallbladder fundus was elevated cephalad.   He was very dilated boggy with virtually no tone.  Chronic changes on a consistent with chronic cholecystitis.  And up having to decompress some bile out of the gallbladder as it was quite boggy.  I used hook cautery to free the peritoneal coverings between the gallbladder and the liver on the posteriolateral and anteriomedial walls.   I used careful blunt and hook dissection to help get a good critical view of the cystic artery and cystic duct. I did further dissection to free 75% of the  gallbladder off the liver bed to get a good critical view of the infundibulum and  cystic duct. I mobilized the cystic artery; and, after getting a good 360 view, ligated the cystic artery using clips. I skeletonized the cystic duct.  I placed a clip on the infundibulum. I did a partial cystic duct-otomy and ensured patency. I placed a 5 FPakistancholangiocatheter through a puncture site at the right subcostal ridge of the abdominal wall and directed it into the cystic duct.  We ran a cholangiogram with dilute radio-opaque contrast and continuous fluoroscopy. Contrast flowed from a side branch consistent with cystic duct cannulization. Contrast flowed up the common hepatic duct into the right and left intrahepatic chains out to secondary radicals. Contrast flowed down the common bile duct easily across the normal ampulla into the duodenum.  This was consistent with a normal cholangiogram.  I removed the cholangiocatheter. I placed clips on the cystic duct x4.  I completed cystic duct transection. I freed the gallbladder from its remaining attachments to the liver. I ensured hemostasis on the gallbladder fossa of the liver and elsewhere. I inspected the rest of the abdomen & detected no injury nor bleeding elsewhere.  However her liver was somewhat harder lobulated suspicious for early cirrhosis.  I did core liver biopsies through the right anterior liver using a 14-gauge Tru-Cut.  Got two good cores after four passes.  I ensured hemostasis.  I removed the gallbladder inside an Endo Catch bag.  I did copious irrigation of several liters of saline.    Again investigated  the abdomen carefully.  Saw no evidence of injury or need for repair elsewhere.   I evacuated the carbon dioxide.   ports removed.  I closed the fascia on the 11 mm left upper quadrant port site with 0 Vicryl suture.   I closed the skin using 4-0 monocryl stitch.  Sterile dressings were applied. The patient was extubated & arrived in the PACU in stable condition..  I had discussed postoperative care with the patient in the  holding area.  Because of her fragile tissues and extensive lysis adhesions, watch her overnight to make sure she does not develop an ileus or any other concerns.   She had concerns of a of some rectal pain with mild rectal stricturing on exam in the holding area but no definite abscess.  Did place her on antibiotics to be safe. I am about to locate the patient's family and discuss operative findings and postoperative goals / instructions.  Instructions are written in the chart as well.  [name], M.D., F.A.C.S. Gastrointestinal and Minimally Invasive Surgery [facility] [address] [place], [place] [address] [phone] [address] / Paging  08/10/2016 9:59 AM

## 2016-08-10 NOTE — Interval H&P Note (Signed)
History and Physical Interval Note:  08/10/2016 7:51 AM  Alexis Ramos  has presented today for surgery, with the diagnosis of Symptomatic biliary colic, probably chronic cholecystitis, SB stricture, h/o Crohn's disease  The various methods of treatment have been discussed with the patient and family. After consideration of risks, benefits and other options for treatment, the patient has consented to  Procedure(s): LAPAROSCOPIC LYSIS OF ADHESIONS (N/A) LAPAROSCOPIC CHOLECYSTECTOMY WITH INTRAOPERATIVE CHOLANGIOGRAM -STANDARD 4 PORT (N/A) as a surgical intervention .  The patient's history has been reviewed, patient examined, no change in status, stable for surgery.  I have reviewed the patient's chart and labs.  Questions were answered to the patient's satisfaction.     Lupie Sawa C.

## 2016-08-10 NOTE — Transfer of Care (Signed)
Immediate Anesthesia Transfer of Care Note  Patient: Alexis Ramos  Procedure(s) Performed: Procedure(s): LAPAROSCOPIC LYSIS OF ADHESIONS WITH CORE LIVER BIOPSY (N/A) LAPAROSCOPIC CHOLECYSTECTOMY WITH INTRAOPERATIVE CHOLANGIOGRAM -STANDARD 4 PORT (N/A)  Patient Location: PACU  Anesthesia Type:General  Level of Consciousness: sedated, patient cooperative and responds to stimulation  Airway & Oxygen Therapy: Patient Spontanous Breathing and Patient connected to face mask  Post-op Assessment: Report given to RN, Post -op Vital signs reviewed and stable and Patient moving all extremities  Post vital signs: Reviewed and stable  Last Vitals:  Vitals:   08/10/16 1007 08/10/16 1015  BP: 138/74 134/68  Pulse: 70 69  Resp: (!) 5 14  Temp: 36.5 C     Last Pain:  Vitals:   08/10/16 0615  TempSrc: Oral      Patients Stated Pain Goal: 3 ([id number])  Complications: No apparent anesthesia complications

## 2016-08-10 NOTE — Anesthesia Procedure Notes (Addendum)
Procedure Name: Intubation Date/Time: 08/10/2016 8:19 AM Performed by: West Pugh Pre-anesthesia Checklist: Patient identified, Emergency Drugs available, Suction available, Patient being monitored and Timeout performed Patient Re-evaluated:Patient Re-evaluated prior to inductionOxygen Delivery Method: Circle system utilized Preoxygenation: Pre-oxygenation with 100% oxygen Intubation Type: IV induction Ventilation: Oral airway inserted - appropriate to patient size Laryngoscope Size: 3 and Glidescope Grade View: Grade I Tube type: Parker flex tip Tube size: 7.5 mm Number of attempts: 1 Airway Equipment and Method: Stylet and Video-laryngoscopy Placement Confirmation: ETT inserted through vocal cords under direct vision,  positive ETCO2,  CO2 detector and breath sounds checked- equal and bilateral Secured at: 22 cm Tube secured with: Tape Dental Injury: Teeth and Oropharynx as per pre-operative assessment  Difficulty Due To: Difficult Airway- due to reduced neck mobility Comments: Patient has had a prior neck fusion. Glidescope utilized. Neck and  maintained in neutral position throughout procedure.

## 2016-08-10 NOTE — H&P (View-Only) (Signed)
Alexis Ramos 07/11/2016 1:38 PM Location: [facility] Patient #: [id number] DOB: [birth year] Single / Language: Alexis Ramos / Race: White Female  History of Present Illness [name] MD; 07/11/2016 5:18 PM) The patient is a 65 year old female who presents for evaluation of gallbladder disease. Note for "Gallbladder disease": Patient sent for surgical consultation by her primary care physician, Dr. [name]. Concern for decreased gallbladder ejection fraction and probable biliary colic.  Pleasant woman. History of Crohn's disease for most of her life. She is require laparotomy 4 times in the 1970s to 90s. On chronic Asacol. Followed intermittently had [facility] as well as by Dr. [name] with [facility] gastroenterology. Has been off all medications for the past year since there was concerned she may have liver hepatitis from this. Usually has 5 or 6 bowel movements a day. Occasionally loose but not severe. No major flares in many years. No major change in bowel function.  However she does get episodes of upper abdominal discomfort. She feels "gripe-y". She wakes up with some discomfort. Discussed with her primary care physician. Went to the emergency room. She recalls a similar episode of that was more severe and she thinks she threw up about 10 years ago. Workup suspicious for a gallstone without evidence of Crohn's flare obstruction. Held off on anything. No attacks for the past 10 years. However she's had a few in the past month. Usually can eat most foods though. Can walk a half hour without difficulty. Had an attack bed know if she called her primary care physician the middle night. Went to the emergency room. Lab work okay. Ultrasound with probable gallstone. No definite cholecystitis. Surgical consultation me. Not classic for gallbladder problems. No evidence of worsening Crohn inflammation. CT scan better than a year ago. No evidence of bowel obstruction. No  evidence of perforation. No diverticulitis. She doesn't think she has appendix any more. Symptoms improved. Went home. Had another episode of discomfort. Saw primary care physician in office. Had nuclear medicine HIDA scan. Gallbladder ejection fraction 9%. However she does not recall having much pain or discomfort around the time of the study.  He's not certain what she wants to do but doesn't like the attacks when she gets them. Does not seem like heartburn. Not like Crohn's.   Other Problems [name], CMA; 07/11/2016 1:38 PM) Cholelithiasis Crohn's Disease Depression Gastroesophageal Reflux Disease High blood pressure Kidney Stone Melanoma  Past Surgical History [name], CMA; 07/11/2016 1:38 PM) Appendectomy Breast Biopsy Right. Colon Polyp Removal - Colonoscopy Knee Surgery Left. Oral Surgery Resection of Small Bowel Spinal Surgery - Neck Tonsillectomy  Diagnostic Studies History [name], CMA; 07/11/2016 1:38 PM) Colonoscopy within last year Mammogram within last year Pap Smear 1-5 years ago  Allergies [name], CMA; 07/11/2016 1:40 PM) Aspirin *ANALGESICS - NonNarcotic* Penicillin G Pot in Dextrose *PENICILLINS* Advil *ANALGESICS - ANTI-INFLAMMATORY* Oxaprozin *ANALGESICS - ANTI-INFLAMMATORY*  Medication History ([name], CMA; 07/11/2016 1:39 PM) Losartan Potassium (100MG Tablet, Oral) Active. AmLODIPine Besylate (5MG Tablet, Oral) Active. Metoprolol Succinate ER (25MG Tablet ER 24HR, Oral) Active. Venlafaxine HCl (75MG Tablet, Oral) Active. Medications Reconciled  Social History [name], CMA; 07/11/2016 1:38 PM) Caffeine use Carbonated beverages, Coffee. No alcohol use No drug use Tobacco use Never smoker.  Family History [name], [place]; 07/11/2016 1:38 PM) Cerebrovascular Accident Father, Mother. Colon Polyps Brother, Father, Mother. Diabetes Mellitus Brother, Mother. Heart Disease  Father. Hypertension Brother, Father, Mother. Kidney Disease Father. Prostate Cancer Brother, Father. Thyroid problems Mother.  Pregnancy / Birth History [name], [place]; 07/11/2016 1:38 PM) Age at menarche 18 years. Age of menopause 35-50 Gravida 0 Para 0     Review of Systems ([place]; 07/11/2016 1:38 PM) General Present- Fatigue. Not Present- Appetite Loss, Chills, Fever, Night Sweats, Weight Gain and Weight Loss. Skin Not Present- Change in Wart/Mole, Dryness, Hives, Jaundice, New Lesions, Non-Healing Wounds, Rash and Ulcer. HEENT Present- Wears glasses/contact lenses. Not Present- Earache, Hearing Loss, Hoarseness, Nose Bleed, Oral Ulcers, Ringing in the Ears, Seasonal Allergies, Sinus Pain, Sore Throat, Visual Disturbances and Yellow Eyes. Respiratory Not Present- Bloody sputum, Chronic Cough, Difficulty Breathing, Snoring and Wheezing. Breast Not Present- Breast Mass, Breast Pain, Nipple Discharge and Skin Changes. Cardiovascular Not Present- Chest Pain, Difficulty Breathing Lying Down, Leg Cramps, Palpitations, Rapid Heart Rate, Shortness of Breath and Swelling of Extremities. Gastrointestinal Present- Bloating, Chronic diarrhea and Indigestion. Not Present- Abdominal Pain, Bloody Stool, Change in Bowel Habits, Constipation, Difficulty Swallowing, Excessive gas, Gets full quickly at meals, Hemorrhoids, Nausea, Rectal Pain and Vomiting. Female Genitourinary Present- Urgency. Not Present- Frequency, Nocturia, Painful Urination and Pelvic Pain. Musculoskeletal Present- Joint Pain. Not Present- Back Pain, Joint Stiffness, Muscle Pain, Muscle Weakness and Swelling of Extremities. Neurological Present- Weakness. Not Present- Decreased Memory, Fainting, Headaches, Numbness, Seizures, Tingling, Tremor and Trouble walking. Psychiatric Present- Depression. Not Present- Anxiety, Bipolar, Change in Sleep Pattern, Fearful and Frequent crying.  Vitals ([name] CMA; 07/11/2016  1:39 PM) 07/11/2016 1:38 PM Weight: 125 lb Height: 59in Body Surface Area: 1.51 m Body Mass Index: 25.25 kg/m  Temp.: 97.41F(Temporal)  Pulse: 73 (Regular)  BP: 130/72 (Sitting, Left Arm, Standard)      Physical Exam [name] MD; 07/11/2016 2:56 PM)  General Mental Status-Alert. General Appearance-Not in acute distress, Not Sickly. Orientation-Oriented X3. Hydration-Well hydrated. Voice-Normal.  Integumentary Global Assessment Upon inspection and palpation of skin surfaces of the - Axillae: non-tender, no inflammation or ulceration, no drainage. and Distribution of scalp and body hair is normal. General Characteristics Temperature - normal warmth is noted.  Head and Neck Head-normocephalic, atraumatic with no lesions or palpable masses. Face Global Assessment - atraumatic, no absence of expression. Neck Global Assessment - no abnormal movements, no bruit auscultated on the right, no bruit auscultated on the left, no decreased range of motion, non-tender. Trachea-midline. Thyroid Gland Characteristics - non-tender.  Eye Eyeball - Left-Extraocular movements intact, No Nystagmus. Eyeball - Right-Extraocular movements intact, No Nystagmus. Cornea - Left-No Hazy. Cornea - Right-No Hazy. Sclera/Conjunctiva - Left-No scleral icterus, No Discharge. Sclera/Conjunctiva - Right-No scleral icterus, No Discharge. Pupil - Left-Direct reaction to light normal. Pupil - Right-Direct reaction to light normal.  ENMT Ears Pinna - Left - no drainage observed, no generalized tenderness observed. Right - no drainage observed, no generalized tenderness observed. Nose and Sinuses External Inspection of the Nose - no destructive lesion observed. Inspection of the nares - Left - quiet respiration. Right - quiet respiration. Mouth and Throat Lips - Upper Lip - no fissures observed, no pallor noted. Lower Lip - no fissures observed, no pallor  noted. Nasopharynx - no discharge present. Oral Cavity/Oropharynx - Tongue - no dryness observed. Oral Mucosa - no cyanosis observed. Hypopharynx - no evidence of airway distress observed.  Chest and Lung Exam Inspection Movements - Normal and Symmetrical. Accessory muscles - No use of accessory muscles in breathing. Palpation Palpation of the chest reveals - Non-tender. Auscultation Breath sounds - Normal and Clear.  Cardiovascular Auscultation Rhythm - Regular. Murmurs & Other Heart Sounds -  Auscultation of the heart reveals - No Murmurs and No Systolic Clicks.  Abdomen Inspection Inspection of the abdomen reveals - No Visible peristalsis and No Abnormal pulsations. Umbilicus - No Bleeding, No Urine drainage. Palpation/Percussion Palpation and Percussion of the abdomen reveal - Soft, Non Tender, No Rebound tenderness, No Rigidity (guarding) and No Cutaneous hyperesthesia. Note: Right and left paramedian vertical incisions consistent with old prior laparotomies. No incisional hernias. No diastases. Minimal discomfort in right upper quadrant. No true Murphy sign. No guarding. No epigastric or left upper quadrant discomfort.  Female Genitourinary Sexual Maturity Tanner 5 - Adult hair pattern. Note: No vaginal bleeding nor discharge  Peripheral Vascular Upper Extremity Inspection - Left - No Cyanotic nailbeds, Not Ischemic. Right - No Cyanotic nailbeds, Not Ischemic.  Neurologic Neurologic evaluation reveals -normal attention span and ability to concentrate, able to name objects and repeat phrases. Appropriate fund of knowledge , normal sensation and normal coordination. Mental Status Affect - not angry, not paranoid. Cranial Nerves-Normal Bilaterally. Gait-Normal.  Neuropsychiatric Mental status exam performed with findings of-able to articulate well with normal speech/language, rate, volume and coherence, thought content normal with ability to perform basic  computations and apply abstract reasoning and no evidence of hallucinations, delusions, obsessions or homicidal/suicidal ideation.  Musculoskeletal Global Assessment Spine, Ribs and Pelvis - no instability, subluxation or laxity. Right Upper Extremity - no instability, subluxation or laxity.  Lymphatic Head & Neck  General Head & Neck Lymphatics: Bilateral - Description - No Localized lymphadenopathy. Axillary  General Axillary Region: Bilateral - Description - No Localized lymphadenopathy. Femoral & Inguinal  Generalized Femoral & Inguinal Lymphatics: Left - Description - No Localized lymphadenopathy. Right - Description - No Localized lymphadenopathy.    Assessment & Plan [name] MD; 07/11/2016 5:20 PM)  CHRONIC CHOLECYSTITIS WITH CALCULUS (K80.10) Impression: Gallstone with decreased gallbladder ejection fraction and upper abdominal pain, suspicious for chronic cholecystitis.  An emergent vague abdominal complaints primarily upper and sometimes right sided but not classic. Challenges that she has a history of Crohn's disease with for prior surgeries. That's consistent with that. CT scan emergency room noted chronic Crohn's in the distal remaining ileum but improved from a year ago and no active fistula disease or other abnormality. No evidence of obstruction.  My concern she does not have a classic history for this. No definite postprandial symptoms although seems like there are some triggers. The rest of the differential diagnosis seems unlikely. Not consistent with heartburn or reflux. No hepatitis or pancreatitis. Question of mesh in the past but has not been on any risk medication since. Consent cirrhosis on CT scan. No evidence of bowel obstruction. I did discuss with her discussing with her gastrologist but she skeptical that they will add much at this point. It does not seem like a Crohn's flare nor like heartburn or reflux to her.  She would like to avoid the risk of  future attacks since she is confident she had one 10 years ago and she's had a couple in the past month, 1 severe enough to go to the emergency room. She notes that she never gets classic symptoms and had perforated appendicitis decades ago without classic symptoms and normal white count. Therefore, I did offer cholecystectomy. Did offer to do diagnostic laparoscopy with possible lysis of adhesions. However risks of surgery to be elevated given her numerous prior surgeries. Would be useful to run the bowel if it's not to tricky. We'll work to coordinate convenient time. She was hoping for later  in the month.  Current Plans You are being scheduled for surgery - Our schedulers will call you.  You should hear from our office's scheduling department within 5 working days about the location, date, and time of surgery. We try to make accommodations for patient's preferences in scheduling surgery, but sometimes the OR schedule or the surgeon's schedule prevents [place] from making those accommodations.  If you have not heard from our office [phone]) in 5 working days, call the office and ask for your surgeon's nurse.  If you have other questions about your diagnosis, plan, or surgery, call the office and ask for your surgeon's nurse.  The anatomy & physiology of hepatobiliary & pancreatic function was discussed. The pathophysiology of gallbladder dysfunction was discussed. Natural history risks without surgery was discussed. I feel the risks of no intervention will lead to serious problems that outweigh the operative risks; therefore, I recommended cholecystectomy to remove the pathology. I explained laparoscopic techniques with possible need for an open approach. Probable cholangiogram to evaluate the bilary tract was explained as well.  Risks such as bleeding, infection, abscess, leak, injury to other organs, need for further treatment, heart attack, death, and other risks were discussed. I noted a good  likelihood this will help address the problem. Possibility that this will not correct all abdominal symptoms was explained. Goals of post-operative recovery were discussed as well. We will work to minimize complications. An educational handout further explaining the pathology and treatment options was given as well. Questions were answered. The patient expresses understanding & wishes to proceed with surgery.  Pt Education - Pamphlet Given - Laparoscopic Gallbladder Surgery: discussed with patient and provided information. Written instructions provided Pt Education - [facility] Laparosopic Post Op HCI (Artie Mcintyre) Pt Education - [facility] Good Bowel Health ([place]) Pt Education - Laparoscopic Cholecystectomy: gallbladder CROHN'S DISEASE WITHOUT COMPLICATION, UNSPECIFIED GASTROINTESTINAL TRACT LOCATION (K50.90) Impression: Chronic Crohn disease with some chronic inflammation in distal ileum. However improve from a year ago. Off all medications given liver issues. Doubt fistular obstruction. Can look at small bowel on diagnostic laparoscopy.  [name], M.D., F.A.C.S. Gastrointestinal and Minimally Invasive Surgery [facility] [address], [place], [place] [address] [phone] Main / Paging

## 2016-08-10 NOTE — Progress Notes (Signed)
Alexis Ramos  1951-06-17 [id number]  Patient Care Team: [name], MD as PCP - General (Internal Medicine) [name], MD as Consulting Physician (Gastroenterology) [name], MD as Consulting Physician (General Surgery)   Patient with history of Crohn's disease with prior surgery.  Not actively immunosuppressed.  Due for diagnostic laparoscopy with cholecystectomy.  Patient and she said some anal discomfort just inside the left posterior aspect of her anus.  Wonders if she has another abscess.  Has been having some chronic loose stools and diarrhea but that close to her baseline.  No fevers or chills.  No bleeding.  No drainage.  General: Pt awake/alert/oriented x4 in no major acute distress Eyes: PERRL, normal EOM. Sclera nonicteric Neuro: CN II-XII intact w/o focal sensory/motor deficits. Lymph: No head/neck/groin lymphadenopathy Psych:  No delerium/psychosis/paranoia HENT: Normocephalic, Mucus membranes moist.  No thrush Neck: Supple, No tracheal deviation Chest: No pain.  Good respiratory excursion. CV:  Pulses intact.  Regular rhythm MS: Normal AROM mjr joints.  No obvious deformity Abdomen: Soft, Nondistended.  Nontender.  No incarcerated hernias. GU: NEFG.  No vaginal bleeding/discharge  Exam done with assistance of female Medical Assistant in the room.  Perianal skin clean with good hygiene.  No obvious fistula.  No chronic edema or water can inflammation.   No pruritis.  No pilonidal disease.  No fissure.  No abscess/fistula.  Small anterior & right lateral anal folds were not classic hemorrhoids.  There are no of air anal canal with some chronic scar left posterior.  However no fissure.  No fluctuance.  No significant pain.  No active bleeding.  Held off on anoscopic exam.   Ext:  SCDs BLE.  No significant edema.  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   I do not see any obvious abscess or fissure or fistula or fistulous disease. She might have a little mild proctitis from this?  However no major rectal bleeding or drainage.  We will cover with a dose of antibiotic today but try to hold off on doing anything more aggressive.  Should she have worsening symptoms, consider reevaluation with her gastroenterologist Dr Cristina Gong to see if she needs to make sure she does not have any active Crohn disease that warrants intervention.    Patient Active Problem List   Diagnosis Date Noted  . Chronic cholecystitis with calculus 08/10/2016  . DES exposure in utero   . Hypertension   . Crohn's disease ([place])   . Menopausal symptoms     Past Medical History:  Diagnosis Date  . Arthritis   . Cancer ([place])    melanoma on back   . Crohn's disease ([place])   . Depression   . DES exposure in utero   . Hypertension   . Menopausal symptoms     Past Surgical History:  Procedure Laterality Date  . Anal abscess surg    . anal sphincterectomy    . APPENDECTOMY    . BOWEL RESECTION    . BREAST SURGERY     Biopsy-Benign  . CERVICAL FUSION    . DILATION AND CURETTAGE OF UTERUS    . HAND SURGERY    . KIDNEY SURGERY    . KNEE SURGERY    . NASAL SINUS SURGERY    . Thumb surg    . TONGUE BASE REDUCTION SOMNOPLASTY  Removed uvula  . TONSILLECTOMY AND ADENOIDECTOMY      Social History   Social History  . Marital status: Single    Spouse name: N/A  . Number of children: N/A  . Years of education: N/A   Occupational History  . Not on file.   Social History Main Topics  . Smoking status: Never Smoker  . Smokeless tobacco: Never Used  . Alcohol use No  . Drug use: No  . Sexual activity: No   Other Topics Concern  . Not on file   Social History Narrative  . No narrative on file    Family History  Problem Relation Age of Onset  . Diabetes Mother   . Hypertension Mother   . Osteoporosis  Mother   . Hypertension Father   . Heart disease Father   . Prostate cancer Father   . Diabetes Brother   . Hypertension Brother   . Cancer Other     Niece-Glioblastoma    Current Facility-Administered Medications  Medication Dose Route Frequency Provider Last Rate Last Dose  . scopolamine (TRANSDERM-SCOP) 1 MG/3DAYS 1.5 mg  1 patch Transdermal Q72H Nilda Simmer, MD         Allergies  Allergen Reactions  . Penicillins Anaphylaxis  . Advil [Ibuprofen]     Bleeding.   Diona Fanti [Aspirin]     Hemorrhage.     Lanae Crumbly [Oxaprozin]     Bleeding.    BP (!) 156/82   Pulse 79   Temp 97.9 F (36.6 C) (Oral)   Resp 18   Ht 4' 11"  (1.499 m)   Wt 56.7 kg (125 lb)   SpO2 100%   BMI 25.25 kg/m   No results found.  Note: This dictation was prepared with Dragon/digital dictation along with [employer]. Any transcriptional errors that result from this process are unintentional.

## 2016-08-10 NOTE — Discharge Instructions (Signed)
LAPAROSCOPIC SURGERY: POST OP INSTRUCTIONS  ######################################################################  EAT Gradually transition to a high fiber diet with a fiber supplement over the next few weeks after discharge.  Start with a pureed / full liquid diet (see below)  WALK Walk an hour a day.  Control your pain to do that.    CONTROL PAIN Control pain so that you can walk, sleep, tolerate sneezing/coughing, go up/down stairs.  HAVE A BOWEL MOVEMENT DAILY Keep your bowels regular to avoid problems.  OK to try a laxative to override constipation.  OK to use an antidairrheal to slow down diarrhea.  Call if not better after 2 tries  CALL IF YOU HAVE PROBLEMS/CONCERNS Call if you are still struggling despite following these instructions. Call if you have concerns not answered by these instructions  ######################################################################    1. DIET: Follow a light bland diet the first 24 hours after arrival home, such as soup, liquids, crackers, etc.  Be sure to include lots of fluids daily.  Avoid fast food or heavy meals as your are more likely to get nauseated.  Eat a low fat the next few days after surgery.   2. Take your usually prescribed home medications unless otherwise directed. 3. PAIN CONTROL: a. Pain is best controlled by a usual combination of three different methods TOGETHER: i. Ice/Heat ii. Over the counter pain medication iii. Prescription pain medication b. Most patients will experience some swelling and bruising around the incisions.  Ice packs or heating pads (30-60 minutes up to 6 times a day) will help. Use ice for the first few days to help decrease swelling and bruising, then switch to heat to help relax tight/sore spots and speed recovery.  Some people prefer to use ice alone, heat alone, alternating between ice & heat.  Experiment to what works for you.  Swelling and bruising can take several weeks to resolve.   c. It is  helpful to take an over-the-counter pain medication regularly for the first few weeks.  Choose one of the following that works best for you: i. Naproxen (Aleve, etc)  Two 241m tabs twice a day ii. Ibuprofen (Advil, etc) Three 2056mtabs four times a day (every meal & bedtime) iii. Acetaminophen (Tylenol, etc) 500-65062mour times a day (every meal & bedtime) d. A  prescription for pain medication (such as oxycodone, hydrocodone, etc) should be given to you upon discharge.  Take your pain medication as prescribed.  i. If you are having problems/concerns with the prescription medicine (does not control pain, nausea, vomiting, rash, itching, etc), please call us [phone] see if we need to switch you to a different pain medicine that will work better for you and/or control your side effect better. ii. If you need a refill on your pain medication, please contact your pharmacy.  They will contact our office to request authorization. Prescriptions will not be filled after 5 pm or on week-ends. 4. Avoid getting constipated.  Between the surgery and the pain medications, it is common to experience some constipation.  Increasing fluid intake and taking a fiber supplement (such as Metamucil, Citrucel, FiberCon, MiraLax, etc) 1-2 times a day regularly will usually help prevent this problem from occurring.  A mild laxative (prune juice, Milk of Magnesia, MiraLax, etc) should be taken according to package directions if there are no bowel movements after 48 hours.   5. Watch out for diarrhea.  If you have many loose bowel movements, simplify your diet to bland foods & liquids for  a few days.  Stop any stool softeners and decrease your fiber supplement.  Switching to mild anti-diarrheal medications (Kayopectate, Pepto Bismol) can help.  If this worsens or does not improve, please call us. 6. Wash / shower every day.  You may shower over the dressings as they are waterproof.  Continue to shower over incision(s)  after the dressing is off. 7. Remove your waterproof bandages 5 days after surgery.  You may leave the incision open to air.  You may replace a dressing/Band-Aid to cover the incision for comfort if you wish.  8. ACTIVITIES as tolerated:   a. You may resume regular (light) daily activities beginning the next day--such as daily self-care, walking, climbing stairs--gradually increasing activities as tolerated.  If you can walk 30 minutes without difficulty, it is safe to try more intense activity such as jogging, treadmill, bicycling, low-impact aerobics, swimming, etc. b. Save the most intensive and strenuous activity for last such as sit-ups, heavy lifting, contact sports, etc  Refrain from any heavy lifting or straining until you are off narcotics for pain control.   c. DO NOT PUSH THROUGH PAIN.  Let pain be your guide: If it hurts to do something, don't do it.  Pain is your body warning you to avoid that activity for another week until the pain goes down. d. You may drive when you are no longer taking prescription pain medication, you can comfortably wear a seatbelt, and you can safely maneuver your car and apply brakes. e. Dennis Bast may have sexual intercourse when it is comfortable.  9. FOLLOW UP in our office a. Please call [facility] at [phone] to set up an appointment to see your surgeon in the office for a follow-up appointment approximately 2-3 weeks after your surgery. b. Make sure that you call for this appointment the day you arrive home to insure a convenient appointment time. 10. IF YOU HAVE DISABILITY OR FAMILY LEAVE FORMS, BRING THEM TO THE OFFICE FOR PROCESSING.  DO NOT GIVE THEM TO YOUR DOCTOR.   WHEN TO CALL us [phone]: 1. Poor pain control 2. Reactions / problems with new medications (rash/itching, nausea, etc)  3. Fever over 101.5 F (38.5 C) 4. Inability to urinate 5. Nausea and/or vomiting 6. Worsening swelling or bruising 7. Continued bleeding from incision. 8. Increased  pain, redness, or drainage from the incision   The clinic staff is available to answer your questions during regular business hours (8:30am-5pm).  Please dont hesitate to call and ask to speak to one of our nurses for clinical concerns.   If you have a medical emergency, go to the nearest emergency room or call 911.  A surgeon from [facility] is always on call at the [facility], [place], [place], [place], [place]  [address] ? MAIN: [phone] ? TOLL FREE: [phone] ?  FAX [phone] [email or web address]     Cholecystitis Cholecystitis is inflammation of the gallbladder. It is often called a gallbladder attack. The gallbladder is a pear-shaped organ that lies beneath the liver on the right side of the body. The gallbladder stores bile, which is a fluid that helps the body to digest fats. If bile builds up in your gallbladder, your gallbladder becomes inflamed. This condition may occur suddenly (be acute). Repeat episodes of acute cholecystitis or prolonged episodes may lead to a long-term (chronic) condition. Cholecystitis is serious and it requires treatment.  CAUSES The most common cause of this condition  is gallstones. Gallstones can block the tube (duct) that carries bile out of your gallbladder. This causes bile to build up. Other causes of this condition include:  Damage to the gallbladder due to a decrease in blood flow.  Infections in the bile ducts.  Scars or kinks in the bile ducts.  Tumors in the liver, pancreas, or gallbladder. RISK FACTORS This condition is more likely to develop in:  People who have sickle cell disease.  People who take birth control pills or use estrogen.  People who have alcoholic liver disease.  People who have liver cirrhosis.  People who have their nutrition delivered through a vein (parenteral nutrition).  People who do not eat or drink (do fasting) for a long  period of time.  People who are obese.  People who have rapid weight loss.  People who are pregnant.  People who have increased triglyceride levels.  People who have pancreatitis. SYMPTOMS Symptoms of this condition include:  Abdominal pain, especially in the upper right area of the abdomen.  Abdominal tenderness or bloating.  Nausea.  Vomiting.  Fever.  Chills.  Yellowing of the skin and the whites of the eyes (jaundice). DIAGNOSIS This condition is diagnosed with a medical history and physical exam. You may also have other tests, including:  Imaging tests, such as:  An ultrasound of the gallbladder.  A CT scan of the abdomen.  A gallbladder nuclear scan (HIDA scan). This scan allows your health care provider to see the bile moving from your liver to your gallbladder and to your small intestine.  MRI.  Blood tests, such as:  A complete blood count, because the white blood cell count may be higher than normal.  Liver function tests, because some levels may be higher than normal with certain types of gallstones. TREATMENT Treatment may include:  Fasting for a certain amount of time.  IV fluids.  Medicine to treat pain or vomiting.  Antibiotic medicine.  Surgery to remove your gallbladder (cholecystectomy). This may happen immediately or at a later time. [place] care will depend on your treatment. In general:  Take over-the-counter and prescription medicines only as told by your health care provider.  If you were prescribed an antibiotic medicine, take it as told by your health care provider. Do not stop taking the antibiotic even if you start to feel better.  Follow instructions from your health care provider about what to eat or drink. When you are allowed to eat, avoid eating or drinking anything that triggers your symptoms.  Keep all follow-up visits as told by your health care provider. This is important. SEEK MEDICAL CARE  IF:  Your pain is not controlled with medicine.  You have a fever. SEEK IMMEDIATE MEDICAL CARE IF:  Your pain moves to another part of your abdomen or to your back.  You continue to have symptoms or you develop new symptoms even with treatment.   This information is not intended to replace advice given to you by your health care provider. Make sure you discuss any questions you have with your health care provider.   Document Released: 11/28/2005 Document Revised: 08/19/2015 Document Reviewed: 03/11/2015 Elsevier Interactive Patient Education [employer].

## 2016-08-11 DIAGNOSIS — K811 Chronic cholecystitis: Secondary | ICD-10-CM | POA: Diagnosis not present

## 2016-08-11 NOTE — Progress Notes (Signed)
Patient discharged home with family. Discharge paperwork explained to patient & pt verbalized understanding. Printed prescription given to patient. Patient has been up ambulating in hallway multiple times this shift and tolerating well- has been passing passing flatus and has 1 BM. Tolerated regular diet well. Pain well managed with PO medications only. Will assist pt to vehicle by nursing staff via W/C.

## 2016-08-11 NOTE — Discharge Summary (Signed)
Physician Discharge Summary  Patient ID: Alexis Ramos MRN: [id number] DOB/AGE: 1951-09-05 65 y.o.  Admit date: 08/10/2016 Discharge date: 08/11/2016  Patient Care Team: [name], MD as PCP - General (Internal Medicine) [name], MD as Consulting Physician (Gastroenterology) [name], MD as Consulting Physician (General Surgery)  Admission Diagnoses: Principal Problem:   Chronic cholecystitis without calculus s/p lap cholecystectomy 08/10/2016 Active Problems:   Crohn's disease [facility])   Discharge Diagnoses:  Principal Problem:   Chronic cholecystitis without calculus s/p lap cholecystectomy 08/10/2016 Active Problems:   Crohn's disease ([place])   POST-OPERATIVE DIAGNOSIS:   Symptomatic biliary colic, probably chronic cholecystitis, SB stricture, h/o Crohn's disease  SURGERY:  08/10/2016  Procedure(s): LAPAROSCOPIC LYSIS OF ADHESIONS WITH CORE LIVER BIOPSY LAPAROSCOPIC CHOLECYSTECTOMY WITH INTRAOPERATIVE CHOLANGIOGRAM -STANDARD 4 PORT  SURGEON:    Surgeon(s): [name], MD  Consults: None  Hospital Course:   The patient underwent the surgery above.  Postoperatively, the patient gradually mobilized and advanced to a solid diet.  Pain and other symptoms were treated aggressively.    By the time of discharge, the patient was walking well the hallways, eating food, having flatus.  Pain was well-controlled on an oral medications.  Based on meeting discharge criteria and continuing to recover, I felt it was safe for the patient to be discharged from the hospital to further recover with close followup. Postoperative recommendations were discussed in detail.  They are written as well.   Significant Diagnostic Studies:  No results found for this or any previous visit (from the past 72 hour(s)).  Dg Cholangiogram Operative  Result Date: 08/10/2016 CLINICAL DATA:  Gallbladder disease, cholelithiasis, abnormal scintigraphy EXAM: INTRAOPERATIVE CHOLANGIOGRAM  TECHNIQUE: Cholangiographic images from the C-arm fluoroscopic device were submitted for interpretation post-operatively. Please see the procedural report for the amount of contrast and the fluoroscopy time utilized. COMPARISON:  Ultrasound 06/11/2016 FINDINGS: No persistent filling defects in the common duct. Intrahepatic ducts are incompletely visualized, appearing decompressed centrally. Contrast passes into the duodenum. Some contrast reflux into the pancreatic duct which appears nondilated. : Negative for retained common duct stone. Electronically Signed   By: Lucrezia Europe M.D.   On: 08/10/2016 10:26    Discharge Exam: Blood pressure 139/69, pulse 76, temperature 98.6 F (37 C), temperature source Oral, resp. rate 18, height 4' 11"  (1.499 m), weight 56.7 kg (125 lb), SpO2 97 %.  General: Pt awake/alert/oriented x4 in no major acute distress.  Smiling, eating solid meal.  Friends at bedside Eyes: PERRL, normal EOM. Sclera nonicteric Neuro: CN II-XII intact w/o focal sensory/motor deficits. Lymph: No head/neck/groin lymphadenopathy Psych:  No delerium/psychosis/paranoia HENT: Normocephalic, Mucus membranes moist.  No thrush Neck: Supple, No tracheal deviation Chest: No pain.  Good respiratory excursion. CV:  Pulses intact.  Regular rhythm MS: Normal AROM mjr joints.  No obvious deformity Abdomen: Soft, Nondistended.  Min tender at incisions.  No incarcerated hernias. Ext:  SCDs BLE.  No significant edema.  No cyanosis Skin: No petechiae / purpura  Discharged Condition: good   Past Medical History:  Diagnosis Date  . Arthritis   . Cancer ([place])    melanoma on back   . Crohn's disease ([place])   . Depression   . DES exposure in utero   . Hypertension   . Menopausal symptoms     Past Surgical History:  Procedure Laterality Date  . Anal abscess surg    . anal sphincterectomy    . APPENDECTOMY    . BOWEL RESECTION    .  BREAST SURGERY     Biopsy-Benign  . CERVICAL FUSION    .  CHOLECYSTECTOMY N/A 08/10/2016   Procedure: LAPAROSCOPIC CHOLECYSTECTOMY WITH INTRAOPERATIVE CHOLANGIOGRAM -STANDARD 4 PORT;  Surgeon: [name], MD;  Location: [facility];  Service: General;  Laterality: N/A;  . DILATION AND CURETTAGE OF UTERUS    . HAND SURGERY    . KIDNEY SURGERY    . KNEE SURGERY    . LAPAROSCOPIC LYSIS OF ADHESIONS N/A 08/10/2016   Procedure: LAPAROSCOPIC LYSIS OF ADHESIONS WITH CORE LIVER BIOPSY;  Surgeon: [name], MD;  Location: [facility];  Service: General;  Laterality: N/A;  . NASAL SINUS SURGERY    . Thumb surg    . TONGUE BASE REDUCTION SOMNOPLASTY     Removed uvula  . TONSILLECTOMY AND ADENOIDECTOMY      Social History   Social History  . Marital status: Single    Spouse name: N/A  . Number of children: N/A  . Years of education: N/A   Occupational History  . Not on file.   Social History Main Topics  . Smoking status: Never Smoker  . Smokeless tobacco: Never Used  . Alcohol use No  . Drug use: No  . Sexual activity: No   Other Topics Concern  . Not on file   Social History Narrative  . No narrative on file    Family History  Problem Relation Age of Onset  . Diabetes Mother   . Hypertension Mother   . Osteoporosis Mother   . Hypertension Father   . Heart disease Father   . Prostate cancer Father   . Diabetes Brother   . Hypertension Brother   . Cancer Other     Niece-Glioblastoma    Current Facility-Administered Medications  Medication Dose Route Frequency Provider Last Rate Last Dose  . acetaminophen (TYLENOL) tablet 650 mg  650 mg Oral Q6H PRN [name], MD       Or  . acetaminophen (TYLENOL) suppository 650 mg  650 mg Rectal Q6H PRN [name], MD      . acetaminophen (TYLENOL) tablet 1,000 mg  1,000 mg Oral Q8H [name], MD   1,000 mg at 08/11/16 1453  . alum & mag hydroxide-simeth (MAALOX/MYLANTA) 200-200-20 MG/5ML suspension 30 mL  30 mL Oral Q6H PRN [name], MD      . diphenhydrAMINE (BENADRYL) 12.5 MG/5ML  elixir 12.5 mg  12.5 mg Oral Q6H PRN [name], MD       Or  . diphenhydrAMINE (BENADRYL) injection 12.5 mg  12.5 mg Intravenous Q6H PRN [name], MD      . enoxaparin (LOVENOX) injection 40 mg  40 mg Subcutaneous Q24H [name], MD   40 mg at 08/11/16 1016  . hydrALAZINE (APRESOLINE) injection 10 mg  10 mg Intravenous Q2H PRN [name], MD      . HYDROmorphone (DILAUDID) injection 0.5-2 mg  0.5-2 mg Intravenous Q2H PRN [name], MD      . lactated ringers bolus 1,000 mL  1,000 mL Intravenous Q8H PRN [name], MD      . lactated ringers infusion   Intravenous Continuous [name], MD 50 mL/hr at 08/11/16 1014    . lip balm (CARMEX) ointment 1 application  1 application Topical BID [name], MD   1 application at 27/78/24 1000  . loperamide (IMODIUM) capsule 2-4 mg  2-4 mg Oral Q8H PRN [name], MD      . losartan (COZAAR) tablet 100 mg  100  mg Oral Daily [name], MD   100 mg at 08/11/16 1018  . magic mouthwash  15 mL Oral QID PRN [name], MD      . menthol-cetylpyridinium (CEPACOL) lozenge 3 mg  1 lozenge Oral PRN [name], MD      . methocarbamol (ROBAXIN) tablet 500 mg  500 mg Oral Q6H PRN [name], MD      . metoprolol (LOPRESSOR) injection 5 mg  5 mg Intravenous Q6H PRN [name], MD      . metoprolol succinate (TOPROL-XL) 24 hr tablet 25 mg  25 mg Oral Daily [name], MD   25 mg at 08/11/16 1017  . metoprolol tartrate (LOPRESSOR) tablet 12.5 mg  12.5 mg Oral Q12H PRN [name], MD      . ondansetron (ZOFRAN-ODT) disintegrating tablet 4 mg  4 mg Oral Q6H PRN [name], MD       Or  . ondansetron [facility]) injection 4 mg  4 mg Intravenous Q6H PRN [name], MD      . oxyCODONE (Oxy IR/ROXICODONE) immediate release tablet 5-10 mg  5-10 mg Oral Q4H PRN [name], MD   5 mg at 08/11/16 0149  . phenol (CHLORASEPTIC) mouth spray 2 spray  2 spray Mouth/Throat PRN [name], MD      . prochlorperazine (COMPAZINE) injection 5-10  mg  5-10 mg Intravenous Q4H PRN [name], MD      . saccharomyces boulardii (FLORASTOR) capsule 250 mg  250 mg Oral BID [name], MD   250 mg at 08/11/16 1017  . simethicone (MYLICON) chewable tablet 40 mg  40 mg Oral Q6H PRN [name], MD      . venlafaxine [facility]) tablet 75 mg  75 mg Oral BID [name], MD   75 mg at 08/11/16 1017     Allergies  Allergen Reactions  . Penicillins Anaphylaxis  . Advil [Ibuprofen]     Bleeding.   Diona Fanti [Aspirin]     Hemorrhage.     Lanae Crumbly [Oxaprozin]     Bleeding.  . Nsaids Other (See Comments)    H/o Crohn's disease    Disposition: 01-Home or Self Care  Discharge Instructions    Call MD for:    Complete by:  As directed   Temperature > 101.16F   Call MD for:    Complete by:  As directed   Temperature > 101.16F   Call MD for:  extreme fatigue    Complete by:  As directed   Call MD for:  extreme fatigue    Complete by:  As directed   Call MD for:  hives    Complete by:  As directed   Call MD for:  hives    Complete by:  As directed   Call MD for:  persistant nausea and vomiting    Complete by:  As directed   Call MD for:  persistant nausea and vomiting    Complete by:  As directed   Call MD for:  redness, tenderness, or signs of infection (pain, swelling, redness, odor or green/yellow discharge around incision site)    Complete by:  As directed   Call MD for:  redness, tenderness, or signs of infection (pain, swelling, redness, odor or green/yellow discharge around incision site)    Complete by:  As directed   Call MD for:  severe uncontrolled pain    Complete by:  As directed   Call MD for:  severe uncontrolled pain  Complete by:  As directed   Diet - low sodium heart healthy    Complete by:  As directed   Start with bland, low residue diet for a few days, then advance to a heart healthy (low fat, high fiber) diet.  If you feel nauseated or constipated, simplify to a liquid only diet for 48 hours until you are feeling better (no  more nausea, farting/passing gas, having a bowel movement, etc...).  If you cannot tolerate even drinking liquids, or feeling worse, let your surgeon know or go to the Emergency Department for help.   Diet - low sodium heart healthy    Complete by:  As directed   Start with bland, low residue diet for a few days, then advance to a heart healthy (low fat, high fiber) diet.  If you feel nauseated or constipated, simplify to a liquid only diet for 48 hours until you are feeling better (no more nausea, farting/passing gas, having a bowel movement, etc...).  If you cannot tolerate even drinking liquids, or feeling worse, let your surgeon know or go to the Emergency Department for help.   Discharge instructions    Complete by:  As directed   Please see discharge instruction sheets.   Also refer to any handouts/printouts that may have been given from the [facility] surgery office (if you visited [place] there before surgery) Please call our office if you have any questions or concerns [phone]   Discharge instructions    Complete by:  As directed   Please see discharge instruction sheets.   Also refer to any handouts/printouts that may have been given from the [facility] surgery office (if you visited [place] there before surgery) Please call our office if you have any questions or concerns [phone]   Discharge wound care:    Complete by:  As directed   If you have closed incisions: Shower and bathe over these incisions with soap and water every day.  It is OK to wash over the dressings: they are waterproof. Remove all surgical dressings on postoperative day #3.  You do not need to replace dressings over the closed incisions unless you feel more comfortable with a Band-Aid covering it.   If you have an open wound: That requires packing, so please see wound care instructions.   In general, remove all dressings, wash wound with soap and water and then replace with saline moistened gauze.  Do the dressing change at  least every day.    Please call our office [phone] if you have further questions.   Discharge wound care:    Complete by:  As directed   If you have closed incisions: Shower and bathe over these incisions with soap and water every day.  It is OK to wash over the dressings: they are waterproof. Remove all surgical dressings on postoperative day #3.  You do not need to replace dressings over the closed incisions unless you feel more comfortable with a Band-Aid covering it.   If you have an open wound: That requires packing, so please see wound care instructions.   In general, remove all dressings, wash wound with soap and water and then replace with saline moistened gauze.  Do the dressing change at least every day.    Please call our office [phone] if you have further questions.   Driving Restrictions    Complete by:  As directed   No driving until off narcotics and can safely swerve away without pain during an emergency  Driving Restrictions    Complete by:  As directed   No driving until off narcotics and can safely swerve away without pain during an emergency   Increase activity slowly    Complete by:  As directed   Increase activity slowly    Complete by:  As directed   Lifting restrictions    Complete by:  As directed   Avoid heavy lifting initially, <20 pounds at first.   Do not push through pain.   You have no specific weight limit: If it hurts to do, DON'T DO IT.    If you feel no pain, you are not injuring anything.  Pain will protect you from injury.   Coughing and sneezing are far more stressful to your incision than any lifting.   Avoid resuming heavy lifting (>50 pounds) or other intense activity until off all narcotic pain medications.   When want to exercise more, give yourself 2 weeks to gradually get back to full intense exercise/activity.   Lifting restrictions    Complete by:  As directed   Avoid heavy lifting initially, <20 pounds at first.   Do not push  through pain.   You have no specific weight limit: If it hurts to do, DON'T DO IT.    If you feel no pain, you are not injuring anything.  Pain will protect you from injury.   Coughing and sneezing are far more stressful to your incision than any lifting.   Avoid resuming heavy lifting (>50 pounds) or other intense activity until off all narcotic pain medications.   When want to exercise more, give yourself 2 weeks to gradually get back to full intense exercise/activity.   May shower / Bathe    Complete by:  As directed   [place].  It is fine for dressings or wounds to be washed/rinsed.  Use gentle soap & water.  This will help the incisions and/or wounds get clean & minimize infection.   May shower / Bathe    Complete by:  As directed   [place].  It is fine for dressings or wounds to be washed/rinsed.  Use gentle soap & water.  This will help the incisions and/or wounds get clean & minimize infection.   May walk up steps    Complete by:  As directed   May walk up steps    Complete by:  As directed   Sexual Activity Restrictions    Complete by:  As directed   Sexual activity as tolerated.  Do not push through pain.  Pain will protect you from injury.   Sexual Activity Restrictions    Complete by:  As directed   Sexual activity as tolerated.  Do not push through pain.  Pain will protect you from injury.   Walk with assistance    Complete by:  As directed   Walk over an hour a day.  May use a walker/cane/companion to help with balance and stamina.   Walk with assistance    Complete by:  As directed   Walk over an hour a day.  May use a walker/cane/companion to help with balance and stamina.       Medication List    TAKE these medications   amLODipine 5 MG tablet Commonly known as:  NORVASC Take 5 mg by mouth daily.   losartan 100 MG tablet Commonly known as:  COZAAR Take 100 mg by mouth daily.   metoprolol succinate 25 MG 24 hr tablet Commonly  known as:   TOPROL-XL Take 25 mg by mouth daily.   traMADol 50 MG tablet Commonly known as:  ULTRAM Take 1-2 tablets (50-100 mg total) by mouth every 6 (six) hours as needed for moderate pain or severe pain.   venlafaxine 75 MG tablet Commonly known as:  EFFEXOR Take 75 mg by mouth 2 (two) times daily with a meal.   VITAMIN B-12 IJ Inject as directed every 21 ( twenty-one) days.      Follow-up Information    Decklyn Hyder C., MD. Schedule an appointment as soon as possible for a visit in 3 weeks.   Specialty:  General Surgery Why:  To follow up after your operation, To follow up after your hospital stay Contact information: [address] [place] [address] [phone]            Signed: Morton Peters, M.D., F.A.C.S. Gastrointestinal and Minimally Invasive Surgery [facility] [address] [place], [place] [address] [phone] Main / Paging   08/11/2016, 3:00 PM

## 2016-08-11 NOTE — Care Management Obs Status (Signed)
[place] NOTIFICATION   Patient Details  Name: Alexis Ramos MRN: [id number] Date of Birth: [birth year]   Medicare Observation Status Notification Given:  Yes    Guadalupe Maple, RN 08/11/2016, 11:10 AM

## 2016-08-11 NOTE — Progress Notes (Signed)
1 Day Post-Op  Subjective: She looks good top site most tender and sore.  Dressing ok    Objective: Vital signs in last 24 hours: Temp:  [97.7 F (36.5 C)-99 F (37.2 C)] 97.8 F (36.6 C) (08/31 1014) Pulse Rate:  [63-72] 68 (08/31 1014) Resp:  [14-23] 18 (08/31 1014) BP: (111-140)/(50-82) 117/68 (08/31 1014) SpO2:  [93 %-100 %] 98 % (08/31 1014) Last BM Date: 08/09/16 Nothing po recorded Urine 3500 1600 IV recorded Afebrile, VSS No labs     Intake/Output from previous day: 08/30 0701 - 08/31 0700 In: 1598.3 [I.V.:1598.3] Out: 3520 [Urine:3500; Blood:20] Intake/Output this shift: No intake/output data recorded.  General appearance: alert, cooperative and no distress GI: soft sore, sites OK,  Lab Results:  No results for input(s): WBC, HGB, HCT, PLT in the last 72 hours.  BMET No results for input(s): NA, K, CL, CO2, GLUCOSE, BUN, CREATININE, CALCIUM in the last 72 hours. PT/INR No results for input(s): LABPROT, INR in the last 72 hours.  No results for input(s): AST, ALT, ALKPHOS, BILITOT, PROT, ALBUMIN in the last 168 hours.   Lipase     Component Value Date/Time   LIPASE 24 06/11/2016 1840     Studies/Results: Dg Cholangiogram Operative  Result Date: 08/10/2016 CLINICAL DATA:  Gallbladder disease, cholelithiasis, abnormal scintigraphy EXAM: INTRAOPERATIVE CHOLANGIOGRAM TECHNIQUE: Cholangiographic images from the C-arm fluoroscopic device were submitted for interpretation post-operatively. Please see the procedural report for the amount of contrast and the fluoroscopy time utilized. COMPARISON:  Ultrasound 06/11/2016 FINDINGS: No persistent filling defects in the common duct. Intrahepatic ducts are incompletely visualized, appearing decompressed centrally. Contrast passes into the duodenum. Some contrast reflux into the pancreatic duct which appears nondilated. : Negative for retained common duct stone. Electronically Signed   By: Lucrezia Europe M.D.   On: 08/10/2016  10:26   Prior to Admission medications   Medication Sig Start Date End Date Taking? Authorizing Provider  amLODipine (NORVASC) 5 MG tablet Take 5 mg by mouth daily. 02/28/16  Yes Historical Provider, MD  losartan (COZAAR) 100 MG tablet Take 100 mg by mouth daily.   Yes Historical Provider, MD  metoprolol succinate (TOPROL-XL) 25 MG 24 hr tablet Take 25 mg by mouth daily. 05/01/16  Yes Historical Provider, MD  venlafaxine (EFFEXOR) 75 MG tablet Take 75 mg by mouth 2 (two) times daily with a meal.  05/16/16  Yes Historical Provider, MD  Cyanocobalamin (VITAMIN B-12 IJ) Inject as directed every 21 ( twenty-one) days.     Historical Provider, MD  traMADol (ULTRAM) 50 MG tablet Take 1-2 tablets (50-100 mg total) by mouth every 6 (six) hours as needed for moderate pain or severe pain. 08/10/16   [name], MD     Medications: . acetaminophen  1,000 mg Oral Q8H  . enoxaparin (LOVENOX) injection  40 mg Subcutaneous Q24H  . lip balm  1 application Topical BID  . losartan  100 mg Oral Daily  . metoprolol succinate  25 mg Oral Daily  . saccharomyces boulardii  250 mg Oral BID  . venlafaxine  75 mg Oral BID   . lactated ringers 50 mL/hr at 08/11/16 1014    Assessment/Plan Symptomatic biliary colic, probable chronic cholecystitis, H/o Crohn's disease, Possible cirhhosis LAPAROSCOPIC LYSIS OF ADHESIONS x 1 hour, LAPAROSCOPIC CHOLECYSTECTOMY WITH INTRAOPERATIVE CHOLANGIOGRAM -STANDARD 4 PORT, CORE LIVER BIOPSY, GASTRIC REPAIR, 08/10/16, Dr. [name] Mild renal insuffiencey   Hx of depression Hx of GERD Hypertension Hx of Melanoma  FEN:  IV fluids/full liquids  ID: Pre op only DVT: Lovenox    Plan:  Regular diet and may go home later today.    LOS: 0 days    Cashel Bellina 08/11/2016 [phone]

## 2016-08-30 ENCOUNTER — Encounter: Payer: Self-pay | Admitting: Surgery

## 2016-08-30 DIAGNOSIS — K7581 Nonalcoholic steatohepatitis (NASH): Secondary | ICD-10-CM | POA: Insufficient documentation

## 2016-12-06 ENCOUNTER — Encounter: Payer: Self-pay | Admitting: Podiatry

## 2016-12-06 ENCOUNTER — Ambulatory Visit (INDEPENDENT_AMBULATORY_CARE_PROVIDER_SITE_OTHER): Payer: Medicare Other

## 2016-12-06 ENCOUNTER — Ambulatory Visit (INDEPENDENT_AMBULATORY_CARE_PROVIDER_SITE_OTHER): Payer: Medicare Other | Admitting: Podiatry

## 2016-12-06 VITALS — BP 140/72 | HR 82 | Resp 18

## 2016-12-06 DIAGNOSIS — M779 Enthesopathy, unspecified: Secondary | ICD-10-CM

## 2016-12-06 DIAGNOSIS — M7741 Metatarsalgia, right foot: Secondary | ICD-10-CM

## 2016-12-06 DIAGNOSIS — B353 Tinea pedis: Secondary | ICD-10-CM | POA: Diagnosis not present

## 2016-12-06 MED ORDER — KETOCONAZOLE 2 % EX CREA: 1.0000 "application " | TOPICAL_CREAM | Freq: Every day | CUTANEOUS | 2 refills | Status: DC

## 2016-12-06 NOTE — Progress Notes (Signed)
   Subjective:    Patient ID: Alexis Ramos, female    DOB: 1951/08/21, 65 y.o.   MRN: [id number]  HPI  65 year old female presents the office today for concerns of athlete's foot between her toes. She's been using over-the-counter treatment for a significant improvement. She also states that she has a corn or hard spot between her fourth and fifth toes in the right foot which is painful. She has tried offloading pads and changes she's got a significant improvement. She's been trying toe separators. She has no other complaints today.   Review of Systems  Gastrointestinal: Positive for diarrhea.       Crohn's   Musculoskeletal: Positive for gait problem.       Objective:   Physical Exam General: AAO x3, NAD  Dermatological: Dry, peeling, erythematous skin interdigitally consistent with tinea pedis. No open lesions or pre-ulcer lesions identified otherwise.  Vascular: Dorsalis Pedis artery and Posterior Tibial artery pedal pulses are 2/4 bilateral with immedate capillary fill time.  There is no pain with calf compression, swelling, warmth, erythema.   Neruologic: Grossly intact via light touch bilateral. Vibratory intact via tuning fork bilateral. Protective threshold with Semmes Wienstein monofilament intact to all pedal sites bilateral.   Musculoskeletal:  There is tenderness in the fourth interspace and this is along the lateral aspect of the fourth metatarsal head in between the fourth and fifth toes distally. This is overlying a prominent area. There is no overlying hyperkeratotic lesion. There is no edema, erythema or signs of infection. There is prominent metatarsal heads plantarly with atrophy of the fat pad. Muscular strength 5/5 in all groups tested bilateral.  Gait: Unassisted, Nonantalgic.      Assessment & Plan:  Lateral submetatarsal 4 pain in between the fourth and fifth toes, tinea pedis -Treatment options discussed including all alternatives, risks, and  complications -Etiology of symptoms were discussed -X-rays were obtained and reviewed with the patient. No evidence of acute fracture identified. No significant bony exostosis palpable -I will leave the majority of her symptoms are due to the lack of fat pad and the prominent metatarsal head on the lateral aspect causing irritation. Offloading pads were dispensed metatarsal pads, toe crest as well as toe sleeves. Also discussed the different shoes. -Prescribed ketoconazole for tinea pedis.  Celesta Gentile, DPM

## 2017-02-03 ENCOUNTER — Other Ambulatory Visit: Payer: Self-pay | Admitting: Gastroenterology

## 2017-02-03 ENCOUNTER — Ambulatory Visit
Admission: RE | Admit: 2017-02-03 | Discharge: 2017-02-03 | Disposition: A | Discharge status: Home / Self Care | Payer: Medicare Other | Source: Ambulatory Visit | Attending: Gastroenterology | Admitting: Gastroenterology

## 2017-02-03 DIAGNOSIS — K56609 Unspecified intestinal obstruction, unspecified as to partial versus complete obstruction: Secondary | ICD-10-CM

## 2017-02-03 DIAGNOSIS — K5000 Crohn's disease of small intestine without complications: Secondary | ICD-10-CM

## 2017-02-03 DIAGNOSIS — R109 Unspecified abdominal pain: Secondary | ICD-10-CM

## 2017-02-21 ENCOUNTER — Encounter (HOSPITAL_COMMUNITY): Payer: Self-pay | Admitting: Emergency Medicine

## 2017-02-21 ENCOUNTER — Emergency Department (HOSPITAL_COMMUNITY): Payer: Medicare Other

## 2017-02-21 ENCOUNTER — Emergency Department (HOSPITAL_COMMUNITY)
Admission: EM | Admit: 2017-02-21 | Discharge: 2017-02-21 | Disposition: A | Discharge status: Home / Self Care | Payer: Medicare Other | Attending: Emergency Medicine | Admitting: Emergency Medicine

## 2017-02-21 DIAGNOSIS — S0990XA Unspecified injury of head, initial encounter: Secondary | ICD-10-CM | POA: Insufficient documentation

## 2017-02-21 DIAGNOSIS — Y9301 Activity, walking, marching and hiking: Secondary | ICD-10-CM | POA: Insufficient documentation

## 2017-02-21 DIAGNOSIS — Y999 Unspecified external cause status: Secondary | ICD-10-CM | POA: Insufficient documentation

## 2017-02-21 DIAGNOSIS — Z85828 Personal history of other malignant neoplasm of skin: Secondary | ICD-10-CM | POA: Diagnosis not present

## 2017-02-21 DIAGNOSIS — N183 Chronic kidney disease, stage 3 (moderate): Secondary | ICD-10-CM | POA: Insufficient documentation

## 2017-02-21 DIAGNOSIS — W108XXA Fall (on) (from) other stairs and steps, initial encounter: Secondary | ICD-10-CM | POA: Diagnosis not present

## 2017-02-21 DIAGNOSIS — Y929 Unspecified place or not applicable: Secondary | ICD-10-CM | POA: Insufficient documentation

## 2017-02-21 DIAGNOSIS — I129 Hypertensive chronic kidney disease with stage 1 through stage 4 chronic kidney disease, or unspecified chronic kidney disease: Secondary | ICD-10-CM | POA: Diagnosis not present

## 2017-02-21 DIAGNOSIS — M542 Cervicalgia: Secondary | ICD-10-CM | POA: Diagnosis not present

## 2017-02-21 DIAGNOSIS — W19XXXA Unspecified fall, initial encounter: Secondary | ICD-10-CM

## 2017-02-21 MED ORDER — HYDROCODONE-ACETAMINOPHEN 5-325 MG PO TABS
1.0000 | ORAL_TABLET | Freq: Once | ORAL | Status: AC
  Administered 2017-02-21: 1 via ORAL
  Filled 2017-02-21: qty 1

## 2017-02-21 MED ORDER — HYDROCODONE-ACETAMINOPHEN 5-325 MG PO TABS: 1.0000 | ORAL_TABLET | Freq: Four times a day (QID) | ORAL | 0 refills | Status: DC | PRN

## 2017-02-21 NOTE — ED Triage Notes (Signed)
Pt states she was going outside and slipped on some ice and fell back striking the back of her head and neck on the doorframe  Pt states she had no LOC but felt like she was going to vomit   Pt is c/o pain to the back of her head and neck  C collar placed in triage

## 2017-02-21 NOTE — Discharge Instructions (Signed)
You were seen today after a fall. Your CT scan effort no evidence of fracture.  However, you have persistent neck pain. CT scan cannot rule out ligamentous injury. Maintain C collar at all times. Follow-up with Dr. Sherwood Gambler for reevaluation. At that time, he may order an MRI for further evaluation if symptoms persist. If you develop any weakness, numbness, tingling in the upper extremities you need to be reevaluated immediately.

## 2017-02-21 NOTE — ED Provider Notes (Signed)
[facility] DEPT Provider Note   CSN: [id number] Arrival date & time: 02/21/17  0037  By signing my name below, I, Alexis Ramos, attest that this documentation has been prepared under the direction and in the presence of Alexis Hacker, MD. Electronically Signed: Oleh Ramos, Scribe. 02/21/17. 1:34 AM.   History   Chief Complaint Chief Complaint  Patient presents with  . Fall    HPI Alexis Ramos is a 66 y.o. female with history of HTN, CKD III, and dyslipidemia who presents to the ED for evaluation following a fall. The patient states that she was walking down her front steps 2 hours ago when she slipped on ice. Stuck her posterior neck on impact. She became nauseated and near syncopal and presented to this facility. At interview, she states that she is painless while at rest. However when "standing or bending her neck" she experiences a "shooting" pain from her neck into her posterior head. Denies any LOC. No focal weaknesses, decreased sensation, or paresthesias distally. No vomiting.  HPI  Past Medical History:  Diagnosis Date  . Arthritis   . Cancer ([place])    melanoma on back   . Crohn's disease ([place])   . Depression   . DES exposure in utero   . Hypertension   . Menopausal symptoms     Patient Active Problem List   Diagnosis Date Noted  . Nonalcoholic steatohepatitis 02/40/9735  . Chronic cholecystitis without calculus s/p lap cholecystectomy 08/10/2016 08/10/2016  . Chronic kidney disease, stage III (moderate) 03/27/2015  . Dyslipidemia 03/25/2015  . History of resection of small bowel 03/25/2015  . DES exposure in utero   . Hypertension   . Crohn's disease ([place])   . Menopausal symptoms     Past Surgical History:  Procedure Laterality Date  . Anal abscess surg    . anal sphincterectomy    . APPENDECTOMY    . BOWEL RESECTION    . BREAST SURGERY     Biopsy-Benign  . CERVICAL FUSION    . CHOLECYSTECTOMY N/A 08/10/2016   Procedure: LAPAROSCOPIC  CHOLECYSTECTOMY WITH INTRAOPERATIVE CHOLANGIOGRAM -STANDARD 4 PORT;  Surgeon: [name], MD;  Location: [facility] ORS;  Service: General;  Laterality: N/A;  . DILATION AND CURETTAGE OF UTERUS    . HAND SURGERY    . KIDNEY SURGERY    . KNEE SURGERY    . LAPAROSCOPIC LYSIS OF ADHESIONS N/A 08/10/2016   Procedure: LAPAROSCOPIC LYSIS OF ADHESIONS WITH CORE LIVER BIOPSY;  Surgeon: [name], MD;  Location: [facility];  Service: General;  Laterality: N/A;  . NASAL SINUS SURGERY    . Thumb surg    . TONGUE BASE REDUCTION SOMNOPLASTY     Removed uvula  . TONSILLECTOMY AND ADENOIDECTOMY      OB History    Gravida Para Term Preterm AB Living   0             SAB TAB Ectopic Multiple Live Births                   Home Medications    Prior to Admission medications   Medication Sig Start Date End Date Taking? Authorizing Provider  amLODipine (NORVASC) 5 MG tablet Take 5 mg by mouth daily. 02/28/16  Yes Historical Provider, MD  Cholecalciferol (VITAMIN D-3) 5000 units TABS Take 5,000 Units by mouth daily.   Yes Historical Provider, MD  Cyanocobalamin (VITAMIN B-12 IJ) Inject as directed every 21 ( twenty-one) days.    Yes Historical  Provider, MD  losartan (COZAAR) 100 MG tablet Take 100 mg by mouth daily.   Yes Historical Provider, MD  metoprolol succinate (TOPROL-XL) 25 MG 24 hr tablet Take 25 mg by mouth daily. 05/01/16  Yes Historical Provider, MD  venlafaxine (EFFEXOR) 75 MG tablet Take 75 mg by mouth 2 (two) times daily with a meal.  05/16/16  Yes Historical Provider, MD  Vitamin E 400 units TABS Take 800 Units by mouth daily.   Yes Historical Provider, MD  HYDROcodone-acetaminophen (NORCO/VICODIN) 5-325 MG tablet Take 1-2 tablets by mouth every 6 (six) hours as needed. 02/21/17   Alexis Hacker, MD  ketoconazole (NIZORAL) 2 % cream Apply 1 application topically daily. Patient not taking: Reported on 02/21/2017 12/06/16   Trula Slade, DPM  traMADol (ULTRAM) 50 MG tablet Take 1-2 tablets  (50-100 mg total) by mouth every 6 (six) hours as needed for moderate pain or severe pain. Patient not taking: Reported on 02/21/2017 08/10/16   [name], MD    Family History Family History  Problem Relation Age of Onset  . Diabetes Mother   . Hypertension Mother   . Osteoporosis Mother   . Hypertension Father   . Heart disease Father   . Prostate cancer Father   . Diabetes Brother   . Hypertension Brother   . Cancer Other     Niece-Glioblastoma    Social History Social History  Substance Use Topics  . Smoking status: Never Smoker  . Smokeless tobacco: Never Used  . Alcohol use No     Allergies   Penicillins; Advil [ibuprofen]; Asa [aspirin]; Daypro [oxaprozin]; and Nsaids   Review of Systems Review of Systems  Gastrointestinal: Positive for nausea. Negative for vomiting.  Musculoskeletal: Positive for neck pain.       Posterior neck/head pain per HPI  Neurological: Negative for syncope, weakness and numbness.  All other systems reviewed and are negative.    Physical Exam Updated Vital Signs BP 154/84 (BP Location: Right Arm)   Pulse 68   Temp 97.6 F (36.4 C) (Oral)   Resp 18   Ht 4' 11"  (1.499 m)   Wt 123 lb (55.8 kg)   SpO2 97%   BMI 24.84 kg/m   Physical Exam  Constitutional: She is oriented to person, place, and time. She appears well-developed and well-nourished. No distress.  HENT:  Head: Normocephalic and atraumatic.  Tenderness to palpation posterior occiput, no hematoma or abrasion noted  Eyes: EOM are normal. Pupils are equal, round, and reactive to light.  Neck:  Collar in place, tenderness to palpation lower C-spine without step off or deformity, limited range of motion secondary to pain  Cardiovascular: Normal rate, regular rhythm and normal heart sounds.   Pulmonary/Chest: Effort normal and breath sounds normal. No respiratory distress. She has no wheezes.  Abdominal: Soft. There is no tenderness.  Neurological: She is alert and  oriented to person, place, and time.  Cranial nerves II through XII intact, 5 out of 5 strength with grip, biceps, triceps, and deltoid strength, normal gait  Skin: Skin is warm and dry.  Psychiatric: She has a normal mood and affect.  Nursing note and vitals reviewed.    ED Treatments / Results  Labs (all labs ordered are listed, but only abnormal results are displayed) Labs Reviewed - No data to display  EKG  EKG Interpretation None       Radiology Ct Head Wo Contrast  Result Date: 02/21/2017 CLINICAL DATA:  Slipped on ice, fell  down steps. Neck injury without loss of consciousness. History of sinonasal surgery, cervical spine fusion. EXAM: CT HEAD WITHOUT CONTRAST CT CERVICAL SPINE WITHOUT CONTRAST TECHNIQUE: Multidetector CT imaging of the head and cervical spine was performed following the standard protocol without intravenous contrast. Multiplanar CT image reconstructions of the cervical spine were also generated. COMPARISON:  MRI of the cervical spine Apr 21, 2009 FINDINGS: CT HEAD FINDINGS BRAIN: No intraparenchymal hemorrhage, mass effect nor midline shift. The ventricles and sulci are normal for age. Patchy supratentorial white matter hypodensities less than expected for patient's age, though non-specific are most compatible with chronic small vessel ischemic disease. No acute large vascular territory infarcts. No abnormal extra-axial fluid collections. Basal cisterns are patent. VASCULAR: Mild calcific atherosclerosis of the carotid siphons. SKULL: No skull fracture. No significant scalp soft tissue swelling. SINUSES/ORBITS: The mastoid air-cells and included paranasal sinuses are well-aerated.The included ocular globes and orbital contents are non-suspicious. OTHER: None. CT CERVICAL SPINE FINDINGS ALIGNMENT: Straightened lordosis. Minimal grade 1 C3-4 anterolisthesis on degenerative basis. SKULL BASE AND VERTEBRAE: Cervical vertebral bodies and posterior elements are intact. C4  through C7 ACDF with arthrodesis. Moderate to severe RIGHT C3-4 facet arthropathy. C2-3 facets are fused on degenerative basis. C1-2 articulation maintained with mild osteoarthrosis. SOFT TISSUES AND SPINAL CANAL: Normal. DISC LEVELS: No significant osseous canal stenosis. Severe RIGHT C3-4 neural foraminal narrowing. UPPER CHEST: Lung apices are clear. OTHER: None. IMPRESSION: CT HEAD: Normal CT HEAD for age. CT CERVICAL SPINE: No acute fracture. Minimal grade 1 C3-4 anterolisthesis and severe RIGHT C3-4 neural foraminal narrowing compatible with adjacent segment disease. Status post C4 through C7 ACDF with arthrodesis. Electronically Signed   By: Elon Alas M.D.   On: 02/21/2017 04:36   Ct Cervical Spine Wo Contrast  Result Date: 02/21/2017 CLINICAL DATA:  Slipped on ice, fell down steps. Neck injury without loss of consciousness. History of sinonasal surgery, cervical spine fusion. EXAM: CT HEAD WITHOUT CONTRAST CT CERVICAL SPINE WITHOUT CONTRAST TECHNIQUE: Multidetector CT imaging of the head and cervical spine was performed following the standard protocol without intravenous contrast. Multiplanar CT image reconstructions of the cervical spine were also generated. COMPARISON:  MRI of the cervical spine Apr 21, 2009 FINDINGS: CT HEAD FINDINGS BRAIN: No intraparenchymal hemorrhage, mass effect nor midline shift. The ventricles and sulci are normal for age. Patchy supratentorial white matter hypodensities less than expected for patient's age, though non-specific are most compatible with chronic small vessel ischemic disease. No acute large vascular territory infarcts. No abnormal extra-axial fluid collections. Basal cisterns are patent. VASCULAR: Mild calcific atherosclerosis of the carotid siphons. SKULL: No skull fracture. No significant scalp soft tissue swelling. SINUSES/ORBITS: The mastoid air-cells and included paranasal sinuses are well-aerated.The included ocular globes and orbital contents are  non-suspicious. OTHER: None. CT CERVICAL SPINE FINDINGS ALIGNMENT: Straightened lordosis. Minimal grade 1 C3-4 anterolisthesis on degenerative basis. SKULL BASE AND VERTEBRAE: Cervical vertebral bodies and posterior elements are intact. C4 through C7 ACDF with arthrodesis. Moderate to severe RIGHT C3-4 facet arthropathy. C2-3 facets are fused on degenerative basis. C1-2 articulation maintained with mild osteoarthrosis. SOFT TISSUES AND SPINAL CANAL: Normal. DISC LEVELS: No significant osseous canal stenosis. Severe RIGHT C3-4 neural foraminal narrowing. UPPER CHEST: Lung apices are clear. OTHER: None. IMPRESSION: CT HEAD: Normal CT HEAD for age. CT CERVICAL SPINE: No acute fracture. Minimal grade 1 C3-4 anterolisthesis and severe RIGHT C3-4 neural foraminal narrowing compatible with adjacent segment disease. Status post C4 through C7 ACDF with arthrodesis. Electronically Signed   By: Sandie Ano  Bloomer M.D.   On: 02/21/2017 04:36    Procedures Procedures (including critical care time)  Medications Ordered in ED Medications  HYDROcodone-acetaminophen (NORCO/VICODIN) 5-325 MG per tablet 1 tablet (0 tablets Oral Hold 02/21/17 0329)     Initial Impression / Assessment and Plan / ED Course  I have reviewed the triage vital signs and the nursing notes.  Pertinent labs & imaging results that were available during my care of the patient were reviewed by me and considered in my medical decision making (see chart for details).     Patient presents with a fall from standing. She is currently awake, alert, and oriented. She does report nausea. She also has neck pain. Neurologically intact at this time. CT scan shows no evidence of intracranial injury. There is no cervical fracture. She does have minimal anterior listhesis and foraminal narrowing as well as evidence of prior surgery. On repeat exam, I was not able to clear her C-spine because of persistent pain. Discussed with the patient's options including MRI  later this morning versus maintaining c-collar and following up with her neurosurgeon, Dr. Sherwood Gambler, and several days for C-spine clearance. At that time if she has persistent symptoms she may need an MRI. She has chosen the latter. I feel this is reasonable. She will be discharged home with pain medication. She was given strict return precautions including worsening pain, weakness in the upper extremities, numbness or tingling in the upper extremities or any new or worsening symptoms.  After history, exam, and medical workup I feel the patient has been appropriately medically screened and is safe for discharge home. Pertinent diagnoses were discussed with the patient. Patient was given return precautions.   Final Clinical Impressions(s) / ED Diagnoses   Final diagnoses:  Fall, initial encounter  Minor head injury, initial encounter  Neck pain    New Prescriptions New Prescriptions   HYDROCODONE-ACETAMINOPHEN (NORCO/VICODIN) 5-325 MG TABLET    Take 1-2 tablets by mouth every 6 (six) hours as needed.   I personally performed the services described in this documentation, which was scribed in my presence. The recorded information has been reviewed and is accurate.    Alexis Hacker, MD 02/21/17 [phone]

## 2017-04-25 ENCOUNTER — Ambulatory Visit: Payer: Medicare Other | Attending: Internal Medicine | Admitting: Physical Therapy

## 2017-04-25 DIAGNOSIS — M542 Cervicalgia: Secondary | ICD-10-CM | POA: Insufficient documentation

## 2017-04-25 DIAGNOSIS — M62838 Other muscle spasm: Secondary | ICD-10-CM | POA: Insufficient documentation

## 2017-04-25 NOTE — Therapy (Signed)
[place], [place], [address] Phone: [phone]   Fax:  [phone]  Physical Therapy Evaluation  Patient Details  Name: Alexis Ramos MRN: [id number] Date of Birth: [birth year] Referring Provider: [name] MD  Encounter Date: 04/25/2017      PT End of Session - 04/25/17 1014    Visit Number 1   Number of Visits 6   Date for PT Re-Evaluation 06/06/17   Authorization Type medicare: kx mod by 15th visit, progress not by 10th   PT Start Time 0931   PT Stop Time 1012   PT Time Calculation (min) 41 min   Activity Tolerance Patient tolerated treatment well   Behavior During Therapy [facility] for tasks assessed/performed      Past Medical History:  Diagnosis Date  . Arthritis   . Cancer ([place])    melanoma on back   . Crohn's disease ([place])   . Depression   . DES exposure in utero   . Hypertension   . Menopausal symptoms     Past Surgical History:  Procedure Laterality Date  . Anal abscess surg    . anal sphincterectomy    . APPENDECTOMY    . BOWEL RESECTION    . BREAST SURGERY     Biopsy-Benign  . CERVICAL FUSION    . CHOLECYSTECTOMY N/A 08/10/2016   Procedure: LAPAROSCOPIC CHOLECYSTECTOMY WITH INTRAOPERATIVE CHOLANGIOGRAM -STANDARD 4 PORT;  Surgeon: [name], MD;  Location: [facility];  Service: General;  Laterality: N/A;  . DILATION AND CURETTAGE OF UTERUS    . HAND SURGERY    . KIDNEY SURGERY    . KNEE SURGERY    . LAPAROSCOPIC LYSIS OF ADHESIONS N/A 08/10/2016   Procedure: LAPAROSCOPIC LYSIS OF ADHESIONS WITH CORE LIVER BIOPSY;  Surgeon: [name], MD;  Location: [facility];  Service: General;  Laterality: N/A;  . NASAL SINUS SURGERY    . Thumb surg    . TONGUE BASE REDUCTION SOMNOPLASTY     Removed uvula  . TONSILLECTOMY AND ADENOIDECTOMY      There were no vitals filed for this visit.       Subjective Assessment - 04/25/17 0935    Subjective pt is 66 y.o F with CC of neck pain  that started March 12 and fell hitting her head of the door jam from slipping on ice. reports going to the ED which nothing was found. She reports continued pain that stays the same.  denies N/t or referral of pain and no HA or diplopia/ tinnitis.    How long can you sit comfortably? unlimited   How long can you stand comfortably? unlimited   How long can you walk comfortably? unlimited   Diagnostic tests CT scan  02/21/2017   Patient Stated Goals to be able to turn my head, decrease pain and tightness.    Currently in Pain? Yes   Pain Score 0-No pain  2/10 at its worst   Pain Location Neck   Pain Orientation Right   Pain Descriptors / Indicators Tightness;Sore   Pain Type Chronic pain   Pain Onset More than a month ago   Pain Frequency Intermittent   Aggravating Factors  turning to the R, looking up/ down,    Pain Relieving Factors getting out of the position, avoding painfull motion            [facility] PT Assessment - 04/25/17 0941      Assessment   Medical Diagnosis chronic neck  pain   Referring Provider [name] MD   Onset Date/Surgical Date --  02/21/2017   Hand Dominance Right   Next MD Visit yearly in July   Prior Therapy yes   for knee and finger     Precautions   Precautions None     Restrictions   Weight Bearing Restrictions No     Balance Screen   Has the patient fallen in the past 6 months Yes   How many times? 1   Has the patient had a decrease in activity level because of a fear of falling?  No   Is the patient reluctant to leave their home because of a fear of falling?  No     Home Environment   Living Environment Private residence   Living Arrangements Alone   Type of [place] to enter   Entrance Stairs-Number of Steps 4   Entrance Stairs-Rails Can reach both   [place] One level   [place] - single point;Walker - 2 wheels;Wheelchair - manual     Prior Function   Level of Independence Independent    Vocation Retired   Leisure gardening, Scientist, water quality, bike riding,      Cognition   Overall Cognitive Status Within Functional Limits for tasks assessed     Observation/Other Assessments   Focus on Therapeutic Outcomes (FOTO)  54% limited  predicted 38%     Posture/Postural Control   Posture/Postural Control Postural limitations   Postural Limitations Rounded Shoulders;Forward head     ROM / Strength   AROM / PROM / Strength AROM;Strength     AROM   AROM Assessment Site Cervical   Cervical Flexion 40  ERP   Cervical Extension 38   Cervical - Right Side Bend 20  pain during motion   Cervical - Left Side Bend 20   Cervical - Right Rotation 40  pain during and at end range   Cervical - Left Rotation 55     Palpation   Palpation comment TTP at the R posterolateral occipital bone, tightness with pain upper trap on the R and levator scapulae with sub-occipital tightness                   [facility] Adult PT Treatment/Exercise - 04/25/17 0941      Neck Exercises: Seated   Neck Retraction 10 reps;5 secs     Manual Therapy   Manual Therapy Soft tissue mobilization;Myofascial release   Manual therapy comments manual trigger point release over the R upper trap x 2, and levator scapulae x 1   Myofascial Release sub-occipital release x 5 min     Neck Exercises: Stretches   Upper Trapezius Stretch 2 reps;30 seconds   Levator Stretch 2 reps;30 seconds                PT Education - 04/25/17 1014    Education provided Yes   Education Details evaluation findings, POC, goals, HEP with proper form/ rationale, anatomy of area involved.   Person(s) Educated Patient   Methods Explanation;Verbal cues   Comprehension Verbalized understanding;Verbal cues required          PT Short Term Goals - 04/25/17 1020      PT SHORT TERM GOAL #1   Title pt to be I with inital HEP (05/16/2017)   Time 3   Period Weeks   Status New     PT SHORT TERM GOAL #2   Title pt  to  demonstrate reduced muscle spasm to promote cervical mobility and reduce pain to </=1/10 with cervical rotation (05/16/2017)   Time 3   Period Weeks   Status New           PT Long Term Goals - 2017/04/30 1238      PT LONG TERM GOAL #1   Title pt will be I with all HEP given as of last visit (06/06/2017)   Time 6   Period Weeks   Status New     PT LONG TERM GOAL #2   Title she will increase cervical rotation to the R by >/= 10 degrees and flexion to >/= 45 degrees with no report of pain for safety with driving and ADLS (7/82/4235)   Time 6   Period Weeks   Status New     PT LONG TERM GOAL #3   Title she will verbalize and demo proper posture to prevent and reduce neck pain (06/06/2017)   Time 6   Period Weeks   Status New     PT LONG TERM GOAL #4   Title increase FOTO score to </= 38% limited to demo improvement in function (06/06/2017)   Time [place] - 04/30/17 1015    Clinical Impression Statement Mrs. Panameno presents to OPPT as a low complexity evaluation with CC of neck pain from slipping on ice and hitting her head on a door frame in 02/21/2017. she demonstates limited R cervical rotaiton with pain and tightness with flexion and sidebending. TTP along the  R upper trap/ levator scapulae and sub-occipitals with mulitple triggers points noted. she would benefit from physical therapy to improve cervical mobility, reduce pain and return to PLOF by addressing the deficits listed.    Rehab Potential Good   PT Frequency 1x / week   PT Duration 6 weeks   PT Treatment/Interventions ADLs/Self Care Home Management;Cryotherapy;Electrical Stimulation;Iontophoresis 4mg /ml Dexamethasone;Moist Heat;Ultrasound;Therapeutic activities;Therapeutic exercise;Taping;Dry needling;Manual techniques;Patient/family education;Passive range of motion   PT Next Visit Plan assess/ review HEP, TPDN,  soft tissue work, modalities PRN, posture correction  exercise   PT Home Exercise Plan chin tuck, upper trap / levator scapulae stretching, scapular retraction   Consulted and Agree with Plan of Care Patient      Patient will benefit from skilled therapeutic intervention in order to improve the following deficits and impairments:  Pain, Improper body mechanics, Postural dysfunction, Increased fascial restricitons, Decreased range of motion, Decreased endurance, Decreased activity tolerance, Decreased balance  Visit Diagnosis: Cervicalgia  Other muscle spasm      G-Codes - 04-30-17 1243    Functional Assessment Tool Used (Outpatient Only) FOTO/ clinical judgement   Functional Limitation Changing and maintaining body position   Changing and Maintaining Body Position Current Status (T6144) At least 40 percent but less than 60 percent impaired, limited or restricted   Changing and Maintaining Body Position Goal Status (R1540) At least 20 percent but less than 40 percent impaired, limited or restricted       Problem List Patient Active Problem List   Diagnosis Date Noted  . Nonalcoholic steatohepatitis 08/67/6195  . Chronic cholecystitis without calculus s/p lap cholecystectomy 08/10/2016 08/10/2016  . Chronic kidney disease, stage III (moderate) 03/27/2015  . Dyslipidemia 03/25/2015  . History of resection of small bowel 03/25/2015  . DES exposure in utero   . Hypertension   . Crohn's disease (  [place])   . Menopausal symptoms    Hermina Barnard PT, DPT, LAT, ATC  04/25/17  12:47 PM      [facility] [address] [place], [place], [address] Phone: [phone]   Fax:  [phone]  Name: Alexis Ramos MRN: [id number] Date of Birth: 1951/02/11

## 2017-05-01 ENCOUNTER — Encounter: Payer: Self-pay | Admitting: Physical Therapy

## 2017-05-01 ENCOUNTER — Ambulatory Visit: Payer: Medicare Other | Admitting: Physical Therapy

## 2017-05-01 DIAGNOSIS — M542 Cervicalgia: Secondary | ICD-10-CM

## 2017-05-01 DIAGNOSIS — M62838 Other muscle spasm: Secondary | ICD-10-CM

## 2017-05-01 NOTE — Therapy (Signed)
[place], [place], [address] Phone: [phone]   Fax:  [phone]  Physical Therapy Treatment  Patient Details  Name: Alexis Ramos MRN: [id number] Date of Birth: 31-Jul-1951 Referring Provider: [name] MD  Encounter Date: 05/01/2017      PT End of Session - 05/01/17 1409    Visit Number 2   Number of Visits 6   Date for PT Re-Evaluation 06/06/17   Authorization Type medicare: kx mod by 15th visit, progress not by 10th   PT Start Time 1330   PT Stop Time 1420   PT Time Calculation (min) 50 min   Activity Tolerance Patient tolerated treatment well   Behavior During Therapy [facility] for tasks assessed/performed      Past Medical History:  Diagnosis Date  . Arthritis   . Cancer ([place])    melanoma on back   . Crohn's disease ([place])   . Depression   . DES exposure in utero   . Hypertension   . Menopausal symptoms     Past Surgical History:  Procedure Laterality Date  . Anal abscess surg    . anal sphincterectomy    . APPENDECTOMY    . BOWEL RESECTION    . BREAST SURGERY     Biopsy-Benign  . CERVICAL FUSION    . CHOLECYSTECTOMY N/A 08/10/2016   Procedure: LAPAROSCOPIC CHOLECYSTECTOMY WITH INTRAOPERATIVE CHOLANGIOGRAM -STANDARD 4 PORT;  Surgeon: [name], MD;  Location: [facility];  Service: General;  Laterality: N/A;  . DILATION AND CURETTAGE OF UTERUS    . HAND SURGERY    . KIDNEY SURGERY    . KNEE SURGERY    . LAPAROSCOPIC LYSIS OF ADHESIONS N/A 08/10/2016   Procedure: LAPAROSCOPIC LYSIS OF ADHESIONS WITH CORE LIVER BIOPSY;  Surgeon: [name], MD;  Location: [facility];  Service: General;  Laterality: N/A;  . NASAL SINUS SURGERY    . Thumb surg    . TONGUE BASE REDUCTION SOMNOPLASTY     Removed uvula  . TONSILLECTOMY AND ADENOIDECTOMY      There were no vitals filed for this visit.      Subjective Assessment - 05/01/17 1332    Subjective "the exercises have been going well,  when I do them"    Currently in Pain? Yes   Pain Score 6   with turning to the R.    Pain Onset More than a month ago   Pain Frequency Intermittent   Aggravating Factors  Turning to the R    Pain Relieving Factors getting out of the positon.                          [place] Adult PT Treatment/Exercise - 05/01/17 1348      Modalities   Modalities Moist Heat     Moist Heat Therapy   Number Minutes Moist Heat 10 Minutes   Moist Heat Location Cervical  r upper trap in supine     Manual Therapy   Manual Therapy Soft tissue mobilization;Myofascial release;Taping;Joint mobilization   Joint Mobilization T1-T6 PA mobs grade 3, R first rib inferior mobs grade 3   Soft tissue mobilization IASTM over the R upper trap   Myofascial Release sub-occipital release x 5 min, fascial stretching/ rollingover the R upper trap   Mulligan R upper trap inhibition taping trial     Neck Exercises: Stretches   Upper Trapezius Stretch 2 reps;30 seconds   Levator Stretch  2 reps;30 seconds  modified for better stretch          Trigger Point Dry Needling - 05/01/17 1347    Consent Given? Yes   Education Handout Provided Yes   Muscles Treated Upper Body Upper trapezius   Upper Trapezius Response Twitch reponse elicited;Palpable increased muscle length  R side only              PT Education - 05/01/17 1409    Education provided Yes   Education Details reviewed previous HEP, benefits of consistently doing to decrease pain and tightness.   Person(s) Educated Patient   Methods Explanation;Verbal cues   Comprehension Verbalized understanding;Verbal cues required          PT Short Term Goals - 04/25/17 1020      PT SHORT TERM GOAL #1   Title pt to be I with inital HEP (05/16/2017)   Time 3   Period Weeks   Status New     PT SHORT TERM GOAL #2   Title pt to demonstrate reduced muscle spasm to promote cervical mobility and reduce pain to </=1/10 with cervical rotation  (05/16/2017)   Time 3   Period Weeks   Status New           PT Long Term Goals - 04/25/17 1238      PT LONG TERM GOAL #1   Title pt will be I with all HEP given as of last visit (06/06/2017)   Time 6   Period Weeks   Status New     PT LONG TERM GOAL #2   Title she will increase cervical rotation to the R by >/= 10 degrees and flexion to >/= 45 degrees with no report of pain for safety with driving and ADLS (6/75/9163)   Time 6   Period Weeks   Status New     PT LONG TERM GOAL #3   Title she will verbalize and demo proper posture to prevent and reduce neck pain (06/06/2017)   Time 6   Period Weeks   Status New     PT LONG TERM GOAL #4   Title increase FOTO score to </= 38% limited to demo improvement in function (06/06/2017)   Time 6   Period Weeks   Status New               Plan - 05/01/17 1410    Clinical Impression Statement pt reports improvement in pain with only 6/10 pain with R cervical rotation. explained and performed TPDN over the R upper trap. Following IASTM techniques and thoracic and 1st rib mobs she reported reduced tightness. trialed inhibition taping to reduce R upper trap tightness. MHP post session for soreness from DN.    Rehab Potential Good   PT Next Visit Plan assess response to TPDN and taping,  soft tissue work, modalities PRN, posture correction exercise, thoracic mobility   PT Home Exercise Plan chin tuck, upper trap / levator scapulae stretching, scapular retraction   Consulted and Agree with Plan of Care Patient      Patient will benefit from skilled therapeutic intervention in order to improve the following deficits and impairments:  Pain, Improper body mechanics, Postural dysfunction, Increased fascial restricitons, Decreased range of motion, Decreased endurance, Decreased activity tolerance, Decreased balance  Visit Diagnosis: Cervicalgia  Other muscle spasm     Problem List Patient Active Problem List   Diagnosis Date Noted   . Nonalcoholic steatohepatitis 84/66/5993  . Chronic cholecystitis without calculus  s/p lap cholecystectomy 08/10/2016 08/10/2016  . Chronic kidney disease, stage III (moderate) 03/27/2015  . Dyslipidemia 03/25/2015  . History of resection of small bowel 03/25/2015  . DES exposure in utero   . Hypertension   . Crohn's disease ([place])   . Menopausal symptoms    Starr Lake PT, DPT, LAT, ATC  05/01/17  2:13 PM      [facility] [address] [place], [place], [address] Phone: [phone]   Fax:  [phone]  Name: Alexis Ramos MRN: [id number] Date of Birth: [birth year]

## 2017-05-12 ENCOUNTER — Encounter: Payer: Self-pay | Admitting: Physical Therapy

## 2017-05-12 ENCOUNTER — Ambulatory Visit: Payer: Medicare Other | Attending: Internal Medicine | Admitting: Physical Therapy

## 2017-05-12 DIAGNOSIS — M542 Cervicalgia: Secondary | ICD-10-CM | POA: Diagnosis present

## 2017-05-12 DIAGNOSIS — M62838 Other muscle spasm: Secondary | ICD-10-CM

## 2017-05-12 NOTE — Therapy (Signed)
[place], [place], [address] Phone: [phone]   Fax:  [phone]  Physical Therapy Treatment  Patient Details  Name: Alexis Ramos MRN: [id number] Date of Birth: June 21, 1951 Referring Provider: [name] MD  Encounter Date: 05/12/2017      PT End of Session - 05/12/17 1050    Visit Number 3   Number of Visits 6   Date for PT Re-Evaluation 06/06/17   Authorization Type medicare: kx mod by 15th visit, progress not by 10th   PT Start Time 1025  pt arrived 10 minutes late   PT Stop Time 1100   PT Time Calculation (min) 35 min   Activity Tolerance Patient tolerated treatment well   Behavior During Therapy [facility] for tasks assessed/performed      Past Medical History:  Diagnosis Date  . Arthritis   . Cancer ([place])    melanoma on back   . Crohn's disease ([place])   . Depression   . DES exposure in utero   . Hypertension   . Menopausal symptoms     Past Surgical History:  Procedure Laterality Date  . Anal abscess surg    . anal sphincterectomy    . APPENDECTOMY    . BOWEL RESECTION    . BREAST SURGERY     Biopsy-Benign  . CERVICAL FUSION    . CHOLECYSTECTOMY N/A 08/10/2016   Procedure: LAPAROSCOPIC CHOLECYSTECTOMY WITH INTRAOPERATIVE CHOLANGIOGRAM -STANDARD 4 PORT;  Surgeon: [name], MD;  Location: [facility];  Service: General;  Laterality: N/A;  . DILATION AND CURETTAGE OF UTERUS    . HAND SURGERY    . KIDNEY SURGERY    . KNEE SURGERY    . LAPAROSCOPIC LYSIS OF ADHESIONS N/A 08/10/2016   Procedure: LAPAROSCOPIC LYSIS OF ADHESIONS WITH CORE LIVER BIOPSY;  Surgeon: [name], MD;  Location: [facility];  Service: General;  Laterality: N/A;  . NASAL SINUS SURGERY    . Thumb surg    . TONGUE BASE REDUCTION SOMNOPLASTY     Removed uvula  . TONSILLECTOMY AND ADENOIDECTOMY      There were no vitals filed for this visit.      Subjective Assessment - 05/12/17 1023    Subjective "I am  doing good, the TPDN really helped alot and I would like to do that again"    Currently in Pain? Yes   Pain Score 0-No pain  just feel stiff   Pain Descriptors / Indicators Tightness;Sore   Pain Type Chronic pain   Pain Onset More than a month ago   Pain Frequency Intermittent   Aggravating Factors  turning to the R   Pain Relieving Factors TPDN,                          [facility] Adult PT Treatment/Exercise - 05/12/17 1047      Neck Exercises: Standing   Other Standing Exercises Rows 1 x 20 with red therdaband  verbal cues to keep shoulders down      Neck Exercises: Seated   Neck Retraction 10 reps;5 secs   Other Seated Exercise lower trap activation with manual resistance 2 x 15     Shoulder Exercises: ROM/Strengthening   UBE (Upper Arm Bike) L 1 x 5 min  changing direction at 2:30      Manual Therapy   Manual therapy comments manual trigger point release over the R upper trap x 1, R levatoar  how to perform at home.   Soft tissue mobilization IASTM over the R upper trap   Mulligan R upper trap inhibition taping trial     Neck Exercises: Stretches   Upper Trapezius Stretch 2 reps;30 seconds   Levator Stretch 2 reps;30 seconds          Trigger Point Dry Needling - 05/12/17 1026    Consent Given? Yes   Education Handout Provided Yes  given previously   Muscles Treated Upper Body Levator scapulae   Upper Trapezius Response Twitch reponse elicited;Palpable increased muscle length   Levator Scapulae Response Twitch response elicited;Palpable increased muscle length              PT Education - 05/12/17 1050    Education provided Yes   Education Details beneifts of manual trigger point releaser and how to perform at home, and where to find the tools.    Person(s) Educated Patient   Methods Explanation;Verbal cues   Comprehension Verbalized understanding;Verbal cues required          PT Short Term Goals - 05/12/17 1105      PT SHORT TERM  GOAL #1   Title pt to be I with inital HEP (05/16/2017)   Time 3   Period Weeks   Status Achieved     PT SHORT TERM GOAL #2   Title pt to demonstrate reduced muscle spasm to promote cervical mobility and reduce pain to </=1/10 with cervical rotation (05/16/2017)   Time 3   Period Weeks   Status Achieved           PT Long Term Goals - 04/25/17 1238      PT LONG TERM GOAL #1   Title pt will be I with all HEP given as of last visit (06/06/2017)   Time 6   Period Weeks   Status New     PT LONG TERM GOAL #2   Title she will increase cervical rotation to the R by >/= 10 degrees and flexion to >/= 45 degrees with no report of pain for safety with driving and ADLS (7/35/3299)   Time 6   Period Weeks   Status New     PT LONG TERM GOAL #3   Title she will verbalize and demo proper posture to prevent and reduce neck pain (06/06/2017)   Time 6   Period Weeks   Status New     PT LONG TERM GOAL #4   Title increase FOTO score to </= 38% limited to demo improvement in function (06/06/2017)   Time 6   Period Weeks   Status New               Plan - 05/12/17 1101    Clinical Impression Statement she reported significant relief of pain and tightness from the last session. Continued TPDN over the L upper trap/ levator scapulae on the R. IASTM techniques performed to decrease muscle soreness. she was able to do scapular stability strengthening and lower trap activation to counter act upper trap activation. she declined modalities post session, pt reported if she is still doing well next visit may be herlast visit.    PT Next Visit Plan assess response to TPDN and taping, if doing well ROM, goals. and d/c if she wants to continued: scapular stability strengthening, lower trap activation, posture awareness   PT Home Exercise Plan chin tuck, upper trap / levator scapulae stretching, scapular retraction   Consulted and Agree with Plan of Care Patient  Patient will benefit from skilled  therapeutic intervention in order to improve the following deficits and impairments:     Visit Diagnosis: Cervicalgia  Other muscle spasm     Problem List Patient Active Problem List   Diagnosis Date Noted  . Nonalcoholic steatohepatitis 94/80/1655  . Chronic cholecystitis without calculus s/p lap cholecystectomy 08/10/2016 08/10/2016  . Chronic kidney disease, stage III (moderate) 03/27/2015  . Dyslipidemia 03/25/2015  . History of resection of small bowel 03/25/2015  . DES exposure in utero   . Hypertension   . Crohn's disease ([place])   . Menopausal symptoms     Kristoffer Balinda Quails PT, DPT, LAT, ATC  05/12/17  11:06 AM       [facility] [address] [place], [place], [address] Phone: [phone]   Fax:  [phone]  Name: Alexis Ramos MRN: [id number] Date of Birth: 1951-12-10

## 2017-05-15 ENCOUNTER — Encounter: Payer: Self-pay | Admitting: Physical Therapy

## 2017-05-15 ENCOUNTER — Ambulatory Visit: Payer: Medicare Other | Admitting: Physical Therapy

## 2017-05-15 DIAGNOSIS — M542 Cervicalgia: Secondary | ICD-10-CM | POA: Diagnosis not present

## 2017-05-15 DIAGNOSIS — M62838 Other muscle spasm: Secondary | ICD-10-CM

## 2017-05-15 NOTE — Patient Instructions (Signed)

## 2017-05-15 NOTE — Therapy (Signed)
[place], [place], [address] Phone: [phone]   Fax:  [phone]  Physical Therapy Treatment  Patient Details  Name: Alexis Ramos MRN: [id number] Date of Birth: Jan 04, 1951 Referring Provider: [name] MD  Encounter Date: 05/15/2017      PT End of Session - 05/15/17 1014    Visit Number 4   Number of Visits 6   Date for PT Re-Evaluation 06/06/17   PT Start Time 0933   PT Stop Time 1020   PT Time Calculation (min) 47 min   Activity Tolerance Patient tolerated treatment well   Behavior During Therapy [facility] for tasks assessed/performed      Past Medical History:  Diagnosis Date  . Arthritis   . Cancer ([place])    melanoma on back   . Crohn's disease ([place])   . Depression   . DES exposure in utero   . Hypertension   . Menopausal symptoms     Past Surgical History:  Procedure Laterality Date  . Anal abscess surg    . anal sphincterectomy    . APPENDECTOMY    . BOWEL RESECTION    . BREAST SURGERY     Biopsy-Benign  . CERVICAL FUSION    . CHOLECYSTECTOMY N/A 08/10/2016   Procedure: LAPAROSCOPIC CHOLECYSTECTOMY WITH INTRAOPERATIVE CHOLANGIOGRAM -STANDARD 4 PORT;  Surgeon: [name], MD;  Location: [facility];  Service: General;  Laterality: N/A;  . DILATION AND CURETTAGE OF UTERUS    . HAND SURGERY    . KIDNEY SURGERY    . KNEE SURGERY    . LAPAROSCOPIC LYSIS OF ADHESIONS N/A 08/10/2016   Procedure: LAPAROSCOPIC LYSIS OF ADHESIONS WITH CORE LIVER BIOPSY;  Surgeon: [name], MD;  Location: [facility];  Service: General;  Laterality: N/A;  . NASAL SINUS SURGERY    . Thumb surg    . TONGUE BASE REDUCTION SOMNOPLASTY     Removed uvula  . TONSILLECTOMY AND ADENOIDECTOMY      There were no vitals filed for this visit.      Subjective Assessment - 05/15/17 0939    Subjective neck is doing really well.I can look up/ down.   The needle therapy really helped.  My low back hurts up to 10 /10  since the pressing of last visit.     Currently in Pain? No/denies   Pain Location Neck   Pain Orientation Right   Multiple Pain Sites Yes  left low back. better with sitting.  worse with starting to stand, walk, stand.  better with lower trunk rotation stretches. It seems to be getting worse.                         [facility] Adult PT Treatment/Exercise - 05/15/17 0001      Self-Care   Self-Care ADL's   ADL's for posture in garden     Neck Exercises: Supine   Other Supine Exercise Decompression 5 minutes, leg lengthener 5 x 5 seconde.  3 pillows for right, only, leg press 5 X 5 seconds( no pillow needed), head press,  shoulder presses 5 x 5 seconds each     Cryotherapy   Number Minutes Cryotherapy 10 Minutes   Cryotherapy Location Lumbar Spine   Type of Cryotherapy --  cold pack, used after posture and body mechanics     Neck Exercises: Stretches   Other Neck Stretches good morning stretch 5 X 5 seconds.  PT Education - 05/15/17 1013    Education provided Yes   Education Details Posture ADL. HEP- decompression   Person(s) Educated Patient   Methods Explanation   Comprehension Need further instruction          PT Short Term Goals - 05/12/17 1105      PT SHORT TERM GOAL #1   Title pt to be I with inital HEP (05/16/2017)   Time 3   Period Weeks   Status Achieved     PT SHORT TERM GOAL #2   Title pt to demonstrate reduced muscle spasm to promote cervical mobility and reduce pain to </=1/10 with cervical rotation (05/16/2017)   Time 3   Period Weeks   Status Achieved           PT Long Term Goals - 05/15/17 1033      PT LONG TERM GOAL #1   Title pt will be I with all HEP given as of last visit (06/06/2017)   Baseline so far independent   Time 6   Period Weeks   Status On-going     PT LONG TERM GOAL #2   Title she will increase cervical rotation to the R by >/= 10 degrees and flexion to >/= 45 degrees with no report of pain  for safety with driving and ADLS (06/10/5283)   Time 6   Period Weeks   Status Unable to assess     PT LONG TERM GOAL #3   Title she will verbalize and demo proper posture to prevent and reduce neck pain (06/06/2017)   Baseline education today   Time 6   Period Weeks   Status On-going     PT LONG TERM GOAL #4   Title increase FOTO score to </= 38% limited to demo improvement in function (06/06/2017)   Time 6   Period Weeks   Status Unable to assess               Plan - 05/15/17 1014    Clinical Impression Statement Neck pain resolved.  Flare left low back, patient related to PT.  Addressed posture today which may help. No neck pain post session,  mild low back pain right   PT Next Visit Plan review decompression, answer any body mechanice questions.    PT Home Exercise Plan chin tuck, upper trap / levator scapulae stretching, scapular retraction   Consulted and Agree with Plan of Care Patient      Patient will benefit from skilled therapeutic intervention in order to improve the following deficits and impairments:  Pain, Improper body mechanics, Postural dysfunction, Increased fascial restricitons, Decreased range of motion, Decreased endurance, Decreased activity tolerance, Decreased balance  Visit Diagnosis: Cervicalgia  Other muscle spasm     Problem List Patient Active Problem List   Diagnosis Date Noted  . Nonalcoholic steatohepatitis 13/24/4010  . Chronic cholecystitis without calculus s/p lap cholecystectomy 08/10/2016 08/10/2016  . Chronic kidney disease, stage III (moderate) 03/27/2015  . Dyslipidemia 03/25/2015  . History of resection of small bowel 03/25/2015  . DES exposure in utero   . Hypertension   . Crohn's disease ([place])   . Menopausal symptoms     Alexis Ramos  PTA 05/15/2017, 10:48 AM  [facility] [address] [place], [place], [address] Phone: [phone]   Fax:  [phone]  Name:  Alexis Ramos MRN: [id number] Date of Birth: 01-16-1951

## 2017-05-19 ENCOUNTER — Other Ambulatory Visit: Payer: Self-pay | Admitting: Internal Medicine

## 2017-05-19 DIAGNOSIS — Z1231 Encounter for screening mammogram for malignant neoplasm of breast: Secondary | ICD-10-CM

## 2017-05-22 ENCOUNTER — Ambulatory Visit: Payer: Medicare Other | Admitting: Physical Therapy

## 2017-05-22 ENCOUNTER — Encounter: Payer: Self-pay | Admitting: Physical Therapy

## 2017-05-22 DIAGNOSIS — M542 Cervicalgia: Secondary | ICD-10-CM | POA: Diagnosis not present

## 2017-05-22 DIAGNOSIS — M62838 Other muscle spasm: Secondary | ICD-10-CM

## 2017-05-22 NOTE — Therapy (Signed)
[place], [place], [address] Phone: [phone]   Fax:  [phone]  Physical Therapy Treatment/ discharge Summary  Patient Details  Name: Alexis Ramos MRN: [id number] Date of Birth: [birth year] Referring Provider: [name] MD  Encounter Date: 05/22/2017      PT End of Session - 05/22/17 0934    Visit Number 5   Number of Visits 6   Date for PT Re-Evaluation 06/06/17   Authorization Type medicare: kx mod by 15th visit, progress not by 10th   PT Start Time 0931  shortened visit due to discharge   PT Stop Time 1000   PT Time Calculation (min) 29 min   Activity Tolerance Patient tolerated treatment well   Behavior During Therapy [facility] for tasks assessed/performed      Past Medical History:  Diagnosis Date  . Arthritis   . Cancer ([place])    melanoma on back   . Crohn's disease ([place])   . Depression   . DES exposure in utero   . Hypertension   . Menopausal symptoms     Past Surgical History:  Procedure Laterality Date  . Anal abscess surg    . anal sphincterectomy    . APPENDECTOMY    . BOWEL RESECTION    . BREAST SURGERY     Biopsy-Benign  . CERVICAL FUSION    . CHOLECYSTECTOMY N/A 08/10/2016   Procedure: LAPAROSCOPIC CHOLECYSTECTOMY WITH INTRAOPERATIVE CHOLANGIOGRAM -STANDARD 4 PORT;  Surgeon: [name], MD;  Location: [facility];  Service: General;  Laterality: N/A;  . DILATION AND CURETTAGE OF UTERUS    . HAND SURGERY    . KIDNEY SURGERY    . KNEE SURGERY    . LAPAROSCOPIC LYSIS OF ADHESIONS N/A 08/10/2016   Procedure: LAPAROSCOPIC LYSIS OF ADHESIONS WITH CORE LIVER BIOPSY;  Surgeon: [name], MD;  Location: [facility];  Service: General;  Laterality: N/A;  . NASAL SINUS SURGERY    . Thumb surg    . TONGUE BASE REDUCTION SOMNOPLASTY     Removed uvula  . TONSILLECTOMY AND ADENOIDECTOMY      There were no vitals filed for this visit.      Subjective Assessment - 05/22/17 0931     Subjective "neck is great, the back is much better at 0/10"   Currently in Pain? No/denies   Pain Score 0-No pain   Pain Type Chronic pain   Pain Onset More than a month ago   Pain Frequency Rarely   Aggravating Factors  N/A   Pain Relieving Factors N/A            [facility] PT Assessment - 05/22/17 0938      Observation/Other Assessments   Focus on Therapeutic Outcomes (FOTO)  28% limited     AROM   Cervical Flexion 54   Cervical Extension 40   Cervical - Right Side Bend 38   Cervical - Left Side Bend 38   Cervical - Right Rotation 56   Cervical - Left Rotation 57                     [facility] Adult PT Treatment/Exercise - 05/22/17 1000      Neck Exercises: Seated   Neck Retraction 10 reps;5 secs   Shoulder ABduction Both;10 reps  horizontal x 2 sets: 1 set red theraband, 1 green     Neck Exercises: Stretches   Upper Trapezius Stretch 2 reps;30 seconds   Levator Stretch 2  reps;30 seconds   Other Neck Stretches good morning stretch 5 X 5 seconds.                  PT Education - 2017-06-14 0957    Education provided Yes   Education Details reviewed proper posture and previously provided HEP, how to progress with increased reps/ sets and different theraband. importance of continuing exercise to avoid reoccurrence.    Person(s) Educated Patient   Methods Explanation;Verbal cues;Handout   Comprehension Verbalized understanding;Verbal cues required          PT Short Term Goals - 2017-06-14 0941      PT SHORT TERM GOAL #1   Title pt to be I with inital HEP (05/16/2017)   Time 3   Period Weeks   Status Achieved     PT SHORT TERM GOAL #2   Title pt to demonstrate reduced muscle spasm to promote cervical mobility and reduce pain to </=1/10 with cervical rotation (05/16/2017)   Time 3   Period Weeks   Status Achieved           PT Long Term Goals - 2017/06/14 0941      PT LONG TERM GOAL #1   Title pt will be I with all HEP given as of last visit  (06/06/2017)   Time 6   Period Weeks   Status Achieved     PT LONG TERM GOAL #2   Title she will increase cervical rotation to the R by >/= 10 degrees and flexion to >/= 45 degrees with no report of pain for safety with driving and ADLS (7/62/2633)   Time 6   Period Weeks   Status Achieved     PT LONG TERM GOAL #3   Title she will verbalize and demo proper posture to prevent and reduce neck pain (06/06/2017)   Time 6   Period Weeks   Status Achieved     PT LONG TERM GOAL #4   Title increase FOTO score to </= 38% limited to demo improvement in function (06/06/2017)   Baseline 28% limited   Time 6   Period Weeks   Status Achieved               Plan - June 14, 2017 3545    Clinical Impression Statement pt reports no neck or back pain. reviewed previously provided HEP which she required minimal cues for proper form and length of hold for stretching. she has significantly improved her cervical mobility. she met all goals and is able to maintain and progress her current level of function and will be DC from PT.   PT Next Visit Plan D/C   PT Home Exercise Plan chin tuck, upper trap / levator scapulae stretching, scapular retraction   Consulted and Agree with Plan of Care Patient      Patient will benefit from skilled therapeutic intervention in order to improve the following deficits and impairments:     Visit Diagnosis: Cervicalgia  Other muscle spasm       G-Codes - 2017/06/14 0959    Functional Assessment Tool Used (Outpatient Only) FOTO/ clinical judgement   Functional Limitation Changing and maintaining body position   Changing and Maintaining Body Position Goal Status (G2563) At least 20 percent but less than 40 percent impaired, limited or restricted   Changing and Maintaining Body Position Discharge Status (S9373) At least 1 percent but less than 20 percent impaired, limited or restricted      Problem List Patient Active Problem  List   Diagnosis Date Noted  .  Nonalcoholic steatohepatitis 92/42/6834  . Chronic cholecystitis without calculus s/p lap cholecystectomy 08/10/2016 08/10/2016  . Chronic kidney disease, stage III (moderate) 03/27/2015  . Dyslipidemia 03/25/2015  . History of resection of small bowel 03/25/2015  . DES exposure in utero   . Hypertension   . Crohn's disease ([place])   . Menopausal symptoms    Delita Chiquito PT, DPT, LAT, ATC  05/22/17  10:02 AM      [facility] [address] [place], [place], [address] Phone: [phone]   Fax:  [phone]  Name: Alexis Ramos MRN: [id number] Date of Birth: 09/05/1951      PHYSICAL THERAPY DISCHARGE SUMMARY  Visits from Start of Care: 5  Current functional level related to goals / functional outcomes: See goals, FOTO 28% limited   Remaining deficits: See above assessment   Education / Equipment: HEP, posture/ mechanics, theraband  Plan: Patient agrees to discharge.  Patient goals were met. Patient is being discharged due to being pleased with the current functional level.  ?????

## 2017-06-13 ENCOUNTER — Ambulatory Visit
Admission: RE | Admit: 2017-06-13 | Discharge: 2017-06-13 | Disposition: A | Discharge status: Home / Self Care | Payer: Medicare Other | Source: Ambulatory Visit | Attending: Internal Medicine | Admitting: Internal Medicine

## 2017-06-13 DIAGNOSIS — Z1231 Encounter for screening mammogram for malignant neoplasm of breast: Secondary | ICD-10-CM

## 2018-12-07 ENCOUNTER — Other Ambulatory Visit: Payer: Self-pay

## 2018-12-07 ENCOUNTER — Emergency Department (HOSPITAL_COMMUNITY): Payer: Medicare Other

## 2018-12-07 ENCOUNTER — Encounter (HOSPITAL_COMMUNITY): Payer: Self-pay | Admitting: *Deleted

## 2018-12-07 ENCOUNTER — Inpatient Hospital Stay (HOSPITAL_COMMUNITY)
Admission: EM | Admit: 2018-12-07 | Discharge: 2018-12-13 | DRG: 386 | Disposition: A | Discharge status: Home / Self Care | Payer: Medicare Other | Attending: Internal Medicine | Admitting: Internal Medicine

## 2018-12-07 DIAGNOSIS — Z880 Allergy status to penicillin: Secondary | ICD-10-CM | POA: Diagnosis not present

## 2018-12-07 DIAGNOSIS — Z66 Do not resuscitate: Secondary | ICD-10-CM | POA: Diagnosis present

## 2018-12-07 DIAGNOSIS — M1990 Unspecified osteoarthritis, unspecified site: Secondary | ICD-10-CM | POA: Diagnosis present

## 2018-12-07 DIAGNOSIS — Z888 Allergy status to other drugs, medicaments and biological substances status: Secondary | ICD-10-CM

## 2018-12-07 DIAGNOSIS — Z8042 Family history of malignant neoplasm of prostate: Secondary | ICD-10-CM

## 2018-12-07 DIAGNOSIS — Z886 Allergy status to analgesic agent status: Secondary | ICD-10-CM | POA: Diagnosis not present

## 2018-12-07 DIAGNOSIS — E872 Acidosis: Secondary | ICD-10-CM | POA: Diagnosis not present

## 2018-12-07 DIAGNOSIS — K7581 Nonalcoholic steatohepatitis (NASH): Secondary | ICD-10-CM | POA: Diagnosis present

## 2018-12-07 DIAGNOSIS — Z8249 Family history of ischemic heart disease and other diseases of the circulatory system: Secondary | ICD-10-CM | POA: Diagnosis not present

## 2018-12-07 DIAGNOSIS — K50912 Crohn's disease, unspecified, with intestinal obstruction: Secondary | ICD-10-CM

## 2018-12-07 DIAGNOSIS — E785 Hyperlipidemia, unspecified: Secondary | ICD-10-CM | POA: Diagnosis present

## 2018-12-07 DIAGNOSIS — Z8262 Family history of osteoporosis: Secondary | ICD-10-CM

## 2018-12-07 DIAGNOSIS — K5000 Crohn's disease of small intestine without complications: Secondary | ICD-10-CM | POA: Diagnosis present

## 2018-12-07 DIAGNOSIS — K50012 Crohn's disease of small intestine with intestinal obstruction: Secondary | ICD-10-CM | POA: Diagnosis not present

## 2018-12-07 DIAGNOSIS — Z79899 Other long term (current) drug therapy: Secondary | ICD-10-CM

## 2018-12-07 DIAGNOSIS — Z85820 Personal history of malignant melanoma of skin: Secondary | ICD-10-CM | POA: Diagnosis not present

## 2018-12-07 DIAGNOSIS — F329 Major depressive disorder, single episode, unspecified: Secondary | ICD-10-CM | POA: Diagnosis present

## 2018-12-07 DIAGNOSIS — K56609 Unspecified intestinal obstruction, unspecified as to partial versus complete obstruction: Secondary | ICD-10-CM

## 2018-12-07 DIAGNOSIS — N183 Chronic kidney disease, stage 3 unspecified: Secondary | ICD-10-CM | POA: Diagnosis present

## 2018-12-07 DIAGNOSIS — I129 Hypertensive chronic kidney disease with stage 1 through stage 4 chronic kidney disease, or unspecified chronic kidney disease: Secondary | ICD-10-CM | POA: Diagnosis present

## 2018-12-07 DIAGNOSIS — Z9049 Acquired absence of other specified parts of digestive tract: Secondary | ICD-10-CM | POA: Diagnosis not present

## 2018-12-07 DIAGNOSIS — Z981 Arthrodesis status: Secondary | ICD-10-CM

## 2018-12-07 DIAGNOSIS — Z833 Family history of diabetes mellitus: Secondary | ICD-10-CM

## 2018-12-07 DIAGNOSIS — F32A Depression, unspecified: Secondary | ICD-10-CM

## 2018-12-07 DIAGNOSIS — I10 Essential (primary) hypertension: Secondary | ICD-10-CM | POA: Diagnosis present

## 2018-12-07 LAB — COMPREHENSIVE METABOLIC PANEL
ALT: 22 U/L (ref 0–44)
ANION GAP: 15 (ref 5–15)
AST: 18 U/L (ref 15–41)
Albumin: 4.3 g/dL (ref 3.5–5.0)
Alkaline Phosphatase: 62 U/L (ref 38–126)
BILIRUBIN TOTAL: 1.2 mg/dL (ref 0.3–1.2)
BUN: 21 mg/dL (ref 8–23)
CO2: 23 mmol/L (ref 22–32)
Calcium: 9.3 mg/dL (ref 8.9–10.3)
Chloride: 99 mmol/L (ref 98–111)
Creatinine, Ser: 1.31 mg/dL — ABNORMAL HIGH (ref 0.44–1.00)
GFR calc Af Amer: 48 mL/min — ABNORMAL LOW (ref >60)
GFR calc non Af Amer: 41 mL/min — ABNORMAL LOW (ref >60)
Glucose, Bld: 179 mg/dL — ABNORMAL HIGH (ref 70–99)
POTASSIUM: 3.6 mmol/L (ref 3.5–5.1)
Sodium: 137 mmol/L (ref 135–145)
TOTAL PROTEIN: 8.2 g/dL — AB (ref 6.5–8.1)

## 2018-12-07 LAB — CBC
HCT: 48.8 % — ABNORMAL HIGH (ref 36.0–46.0)
HEMOGLOBIN: 15.3 g/dL — AB (ref 12.0–15.0)
MCH: 29.8 pg (ref 26.0–34.0)
MCHC: 31.4 g/dL (ref 30.0–36.0)
MCV: 94.9 fL (ref 80.0–100.0)
PLATELETS: 229 10*3/uL (ref 150–400)
RBC: 5.14 MIL/uL — AB (ref 3.87–5.11)
RDW: 13.2 % (ref 11.5–15.5)
WBC: 9.8 10*3/uL (ref 4.0–10.5)
nRBC: 0 % (ref 0.0–0.2)

## 2018-12-07 LAB — LIPASE, BLOOD: Lipase: 27 U/L (ref 11–51)

## 2018-12-07 MED ORDER — METHYLPREDNISOLONE SODIUM SUCC 40 MG IJ SOLR
40.0000 mg | Freq: Once | INTRAMUSCULAR | Status: AC
  Administered 2018-12-07: 40 mg via INTRAVENOUS
  Filled 2018-12-07: qty 1

## 2018-12-07 MED ORDER — ONDANSETRON HCL 4 MG PO TABS: 4.0000 mg | ORAL_TABLET | Freq: Four times a day (QID) | ORAL | Status: DC | PRN

## 2018-12-07 MED ORDER — ONDANSETRON HCL 4 MG/2ML IJ SOLN: 4.0000 mg | Freq: Four times a day (QID) | INTRAMUSCULAR | Status: DC | PRN

## 2018-12-07 MED ORDER — LOSARTAN POTASSIUM 50 MG PO TABS
100.0000 mg | ORAL_TABLET | Freq: Every day | ORAL | Status: DC
  Administered 2018-12-07 – 2018-12-11 (×5): 100 mg via ORAL
  Filled 2018-12-07 (×5): qty 2

## 2018-12-07 MED ORDER — METOPROLOL SUCCINATE ER 25 MG PO TB24
25.0000 mg | ORAL_TABLET | Freq: Every day | ORAL | Status: DC
  Administered 2018-12-07 – 2018-12-13 (×7): 25 mg via ORAL
  Filled 2018-12-07 (×7): qty 1

## 2018-12-07 MED ORDER — SODIUM CHLORIDE 0.9 % IV BOLUS
1000.0000 mL | Freq: Once | INTRAVENOUS | Status: AC
Start: 2018-12-07 — End: 2018-12-07
  Administered 2018-12-07: 1000 mL via INTRAVENOUS

## 2018-12-07 MED ORDER — SODIUM CHLORIDE 0.9 % IV SOLN
INTRAVENOUS | Status: AC
  Administered 2018-12-08: 05:00:00 via INTRAVENOUS

## 2018-12-07 MED ORDER — AMLODIPINE BESYLATE 5 MG PO TABS
5.0000 mg | ORAL_TABLET | Freq: Every day | ORAL | Status: DC
  Administered 2018-12-07 – 2018-12-13 (×7): 5 mg via ORAL
  Filled 2018-12-07 (×7): qty 1

## 2018-12-07 MED ORDER — ACETAMINOPHEN 650 MG RE SUPP: 650.0000 mg | Freq: Four times a day (QID) | RECTAL | Status: DC | PRN

## 2018-12-07 MED ORDER — ONDANSETRON HCL 4 MG/2ML IJ SOLN
4.0000 mg | Freq: Once | INTRAMUSCULAR | Status: AC
  Administered 2018-12-07: 4 mg via INTRAVENOUS
  Filled 2018-12-07: qty 2

## 2018-12-07 MED ORDER — ACETAMINOPHEN 325 MG PO TABS: 650.0000 mg | ORAL_TABLET | Freq: Four times a day (QID) | ORAL | Status: DC | PRN

## 2018-12-07 MED ORDER — VENLAFAXINE HCL 75 MG PO TABS
75.0000 mg | ORAL_TABLET | Freq: Every day | ORAL | Status: DC
  Administered 2018-12-07 – 2018-12-08 (×3): 75 mg via ORAL
  Filled 2018-12-07 (×5): qty 1

## 2018-12-07 MED ORDER — SODIUM CHLORIDE 0.9 % IV SOLN
Freq: Once | INTRAVENOUS | Status: AC
  Administered 2018-12-07: 20:00:00 via INTRAVENOUS

## 2018-12-07 MED ORDER — METHYLPREDNISOLONE SODIUM SUCC 40 MG IJ SOLR
40.0000 mg | Freq: Two times a day (BID) | INTRAMUSCULAR | Status: DC
  Administered 2018-12-08 – 2018-12-13 (×11): 40 mg via INTRAVENOUS
  Filled 2018-12-07 (×11): qty 1

## 2018-12-07 MED ORDER — HEPARIN SODIUM (PORCINE) 5000 UNIT/ML IJ SOLN
5000.0000 [IU] | Freq: Three times a day (TID) | INTRAMUSCULAR | Status: DC
  Administered 2018-12-07 – 2018-12-13 (×17): 5000 [IU] via SUBCUTANEOUS
  Filled 2018-12-07 (×15): qty 1

## 2018-12-07 MED ORDER — IOPAMIDOL (ISOVUE-300) INJECTION 61%
100.0000 mL | Freq: Once | INTRAVENOUS | Status: AC | PRN
  Administered 2018-12-07: 80 mL via INTRAVENOUS

## 2018-12-07 NOTE — ED Notes (Signed)
Patient transported to CT 

## 2018-12-07 NOTE — ED Provider Notes (Signed)
[facility] DEPT Provider Note   CSN: [id number] Arrival date & time: 12/07/18  1608     History   Chief Complaint Chief Complaint  Patient presents with  . Diarrhea    HPI Alexis Ramos is a 67 y.o. female.  She has a history of Crohn's disease for about 50 years and she is complaining of a probable bowel obstruction.  She said she is been doing good for about 7 years and noticed yesterday that her stomach felt upset and she was getting nauseous.  She started just drinking clear liquids then and has been having yellow diarrhea.  She has not vomited.  No blood from either end.  No fevers or chills.  She called her PCP who referred her here.  She usually follows with Dr. Linna Hoff from [place] for GI but has not seen him in over a year.  She has had 3 bowel resections but has no ostomy.  The history is provided by the patient.  Diarrhea   This is a recurrent problem. The current episode started yesterday. The problem occurs 2 to 4 times per day. The problem has not changed since onset.There has been no fever. Associated symptoms include abdominal pain. Pertinent negatives include no vomiting, no chills, no sweats, no headaches, no arthralgias, no myalgias, no URI and no cough. She has tried increased fluid intake and a change of diet for the symptoms. The treatment provided mild relief. Her past medical history is significant for inflammatory bowel disease and bowel resection.    Past Medical History:  Diagnosis Date  . Arthritis   . Cancer ([place])    melanoma on back   . Crohn's disease ([place])   . Depression   . DES exposure in utero   . Hypertension   . Menopausal symptoms     Patient Active Problem List   Diagnosis Date Noted  . Nonalcoholic steatohepatitis 40/98/1191  . Chronic cholecystitis without calculus s/p lap cholecystectomy 08/10/2016 08/10/2016  . Chronic kidney disease, stage III (moderate) ([place]) 03/27/2015  . Dyslipidemia 03/25/2015  .  History of resection of small bowel 03/25/2015  . DES exposure in utero   . Hypertension   . Crohn's disease ([place])   . Menopausal symptoms     Past Surgical History:  Procedure Laterality Date  . Anal abscess surg    . anal sphincterectomy    . APPENDECTOMY    . BOWEL RESECTION    . BREAST BIOPSY Left   . BREAST SURGERY     Biopsy-Benign  . CERVICAL FUSION    . CHOLECYSTECTOMY N/A 08/10/2016   Procedure: LAPAROSCOPIC CHOLECYSTECTOMY WITH INTRAOPERATIVE CHOLANGIOGRAM -STANDARD 4 PORT;  Surgeon:  Boston, MD;  Location: [facility];  Service: General;  Laterality: N/A;  . DILATION AND CURETTAGE OF UTERUS    . HAND SURGERY    . KIDNEY SURGERY    . KNEE SURGERY    . LAPAROSCOPIC LYSIS OF ADHESIONS N/A 08/10/2016   Procedure: LAPAROSCOPIC LYSIS OF ADHESIONS WITH CORE LIVER BIOPSY;  Surgeon:  Boston, MD;  Location: [facility];  Service: General;  Laterality: N/A;  . NASAL SINUS SURGERY    . Thumb surg    . TONGUE BASE REDUCTION SOMNOPLASTY     Removed uvula  . TONSILLECTOMY AND ADENOIDECTOMY       OB History    Gravida  0   Para      Term      Preterm      AB  Living        SAB      TAB      Ectopic      Multiple      Live Births               Home Medications    Prior to Admission medications   Medication Sig Start Date End Date Taking? Authorizing Provider  amLODipine (NORVASC) 5 MG tablet Take 5 mg by mouth daily. 02/28/16   [provider]  Cholecalciferol (VITAMIN D-3) 5000 units TABS Take 5,000 Units by mouth daily.    [provider]  Cyanocobalamin (VITAMIN B-12 IJ) Inject as directed every 21 ( twenty-one) days.     [provider]  HYDROcodone-acetaminophen (NORCO/VICODIN) 5-325 MG tablet Take 1-2 tablets by mouth every 6 (six) hours as needed. 02/21/17   Horton, Barbette Hair, MD  ketoconazole (NIZORAL) 2 % cream Apply 1 application topically daily. 12/06/16   Trula Slade, DPM  losartan (COZAAR) 100 MG tablet  Take 100 mg by mouth daily.    [provider]  metoprolol succinate (TOPROL-XL) 25 MG 24 hr tablet Take 25 mg by mouth daily. 05/01/16   [provider]  traMADol (ULTRAM) 50 MG tablet Take 1-2 tablets (50-100 mg total) by mouth every 6 (six) hours as needed for moderate pain or severe pain. 08/10/16    Boston, MD  venlafaxine (EFFEXOR) 75 MG tablet Take 75 mg by mouth 2 (two) times daily with a meal.  05/16/16   [provider]  Vitamin E 400 units TABS Take 800 Units by mouth daily.    [provider]    Family History Family History  Problem Relation Age of Onset  . Diabetes Mother   . Hypertension Mother   . Osteoporosis Mother   . Hypertension Father   . Heart disease Father   . Prostate cancer Father   . Diabetes Brother   . Hypertension Brother   . Cancer Other        Niece-Glioblastoma  . Breast cancer Neg Hx     Social History Social History   Tobacco Use  . Smoking status: Never Smoker  . Smokeless tobacco: Never Used  Substance Use Topics  . Alcohol use: No  . Drug use: No     Allergies   Penicillins; Advil [ibuprofen]; Asa [aspirin]; Daypro [oxaprozin]; and Nsaids   Review of Systems Review of Systems  Constitutional: Negative for chills and fever.  HENT: Negative for sore throat.   Eyes: Negative for visual disturbance.  Respiratory: Negative for cough and shortness of breath.   Cardiovascular: Negative for chest pain.  Gastrointestinal: Positive for abdominal pain, diarrhea and nausea. Negative for vomiting.  Genitourinary: Negative for dysuria.  Musculoskeletal: Negative for arthralgias and myalgias.  Skin: Negative for rash.  Neurological: Negative for headaches.     Physical Exam Updated Vital Signs BP (!) 157/95 (BP Location: Left Arm)   Pulse (!) 106   Temp 97.9 F (36.6 C) (Oral)   Resp 16   Wt 53.1 kg   SpO2 97%   BMI 23.63 kg/m   Physical Exam Vitals signs and nursing note reviewed.    Constitutional:      General: She is not in acute distress.    Appearance: She is well-developed.  HENT:     Head: Normocephalic and atraumatic.  Eyes:     Conjunctiva/sclera: Conjunctivae normal.  Neck:     Musculoskeletal: Neck supple.  Cardiovascular:  Rate and Rhythm: Normal rate and regular rhythm.     Heart sounds: No murmur.  Pulmonary:     Effort: Pulmonary effort is normal. No respiratory distress.     Breath sounds: Normal breath sounds.  Abdominal:     Palpations: Abdomen is soft.     Tenderness: There is no abdominal tenderness. There is no guarding.  Musculoskeletal: Normal range of motion.        General: No swelling or tenderness.  Skin:    General: Skin is warm and dry.     Capillary Refill: Capillary refill takes less than 2 seconds.  Neurological:     General: No focal deficit present.     Mental Status: She is alert.     Motor: No weakness.     Gait: Gait normal.      ED Treatments / Results  Labs (all labs ordered are listed, but only abnormal results are displayed) Labs Reviewed  COMPREHENSIVE METABOLIC PANEL - Abnormal; Notable for the following components:      Result Value   Glucose, Bld 179 (*)    Creatinine, Ser 1.31 (*)    Total Protein 8.2 (*)    GFR calc non Af Amer 41 (*)    GFR calc Af Amer 48 (*)    All other components within normal limits  CBC - Abnormal; Notable for the following components:   RBC 5.14 (*)    Hemoglobin 15.3 (*)    HCT 48.8 (*)    All other components within normal limits  LIPASE, BLOOD  URINALYSIS, ROUTINE W REFLEX MICROSCOPIC  HIV ANTIBODY (ROUTINE TESTING W REFLEX)  BASIC METABOLIC PANEL  CBC    EKG None  Radiology Ct Abdomen Pelvis W Contrast  Result Date: 12/07/2018 CLINICAL DATA:  Diffuse abdominal pain.  History of Crohn's disease. EXAM: CT ABDOMEN AND PELVIS WITH CONTRAST TECHNIQUE: Multidetector CT imaging of the abdomen and pelvis was performed using the standard protocol following bolus  administration of intravenous contrast. CONTRAST:  97m ISOVUE-300 IOPAMIDOL (ISOVUE-300) INJECTION 61% COMPARISON:  CT scan June 11, 2016. FINDINGS: Lower chest: No acute abnormality. Hepatobiliary: Status post cholecystectomy. No biliary dilatation is noted. Probable fatty infiltration of the liver. Pancreas: Unremarkable. No pancreatic ductal dilatation or surrounding inflammatory changes. Spleen: Normal in size without focal abnormality. Adrenals/Urinary Tract: Adrenal glands appear normal. Nonobstructive left nephrolithiasis is noted. Bilateral renal cysts are noted. No hydronephrosis or renal obstruction is noted. Urinary bladder is unremarkable. Stomach/Bowel: The stomach appears normal. Status post appendectomy. Colon is nondilated. There is seen wall thickening involving the terminal ileum consistent with history of Crohn's disease, although acute inflammation is not visualized. There is noted mild dilatation of distal small bowel loops. Vascular/Lymphatic: Aortic atherosclerosis. No enlarged abdominal or pelvic lymph nodes. Reproductive: Uterus and bilateral adnexa are unremarkable. Other: No abdominal wall hernia or abnormality. No abdominopelvic ascites. Musculoskeletal: No acute or significant osseous findings. IMPRESSION: Wall thickening of terminal ileum is noted consistent with Crohn's disease, although no definite inflammatory changes are noted to suggest acute inflammation. There is noted mild distal small bowel dilatation suggesting partial obstruction. Probable fatty infiltration of the liver. Nonobstructive left nephrolithiasis. Aortic Atherosclerosis (ICD10-I70.0). Electronically Signed   By: JMarijo Conception M.D.   On: 12/07/2018 19:01    Procedures Procedures (including critical care time)  Medications Ordered in ED Medications  sodium chloride 0.9 % bolus 1,000 mL (has no administration in time range)  ondansetron (ZOFRAN) injection 4 mg (has no administration in time range)  Initial Impression / Assessment and Plan / ED Course  I have reviewed the triage vital signs and the nursing notes.  Pertinent labs & imaging results that were available during my care of the patient were reviewed by me and considered in my medical decision making (see chart for details).  Clinical Course as of Dec 08 2343  Fri Dec 07, 2018  1933 Discussed with Dr. Paulita Fujita from [place].  He is covering the patient's primary GI doctor Dr. Cristina Gong.  He agrees with admission and recommend starting the patient on 40 mg of Solu-Medrol every 12 hours, patient may have sepsis she is feeling up to it and he will see her tomorrow.  I will page the hospitalist regarding admission.   [MB]    Clinical Course User Index [MB] Hayden Rasmussen, MD     Final Clinical Impressions(s) / ED Diagnoses   Final diagnoses:  Exacerbation of Crohn's disease with intestinal obstruction [facility])    ED Discharge Orders    None       Hayden Rasmussen, MD 12/07/18 2345

## 2018-12-07 NOTE — ED Notes (Signed)
ED TO INPATIENT HANDOFF REPORT  Name/Age/Gender Alexis Ramos 67 y.o. female  Code Status Code Status History    Date Active Date Inactive Code Status Order ID Comments User Context   08/10/2016 1243 08/11/2016 1912 Full Code [id number]  [name], MD Inpatient    Advance Directive Documentation     Most Recent Value  Type of Advance Directive  Healthcare Power of Attorney, Living will  Pre-existing out of facility DNR order (yellow form or pink MOST form)  -  "MOST" Form in Place?  -      Home/SNF/Other Home  Chief Complaint crohn's flare up  Level of Care/Admitting Diagnosis ED Disposition    ED Disposition Condition [place]: [facility] [[id number]]  Level of Care: Med-Surg [16]  Diagnosis: Crohn's disease involving terminal ileum [facility]) [[id number]]  Admitting Physician: Lenore Cordia [[id number]]  Attending Physician: Lenore Cordia [[id number]]  Estimated length of stay: past midnight tomorrow  Certification:: I certify this patient will need inpatient services for at least 2 midnights  PT Class (Do Not Modify): Inpatient [101]  PT Acc Code (Do Not Modify): Private [1]       Medical History Past Medical History:  Diagnosis Date  . Arthritis   . Cancer ([place])    melanoma on back   . Crohn's disease ([place])   . Depression   . DES exposure in utero   . Hypertension   . Menopausal symptoms     Allergies Allergies  Allergen Reactions  . Penicillins Anaphylaxis    Pt grandmother died from it and her brother had anaphylaxis so she was told never to take.  Has patient had a PCN reaction causing immediate rash, facial/tongue/throat swelling, SOB or lightheadedness with hypotension: No Has patient had a PCN reaction causing severe rash involving mucus membranes or skin necrosis: No Has patient had a PCN reaction that required hospitalization No Has patient had a PCN reaction occurring within the last 10 years: No If all of  the above answers are "NO", then may pr  . Advil [Ibuprofen]     Bleeding.   Diona Fanti [Aspirin]     Hemorrhage.     Lanae Crumbly [Oxaprozin]     Bleeding.  . Nsaids Other (See Comments)    H/o Crohn's disease    IV Location/Drains/Wounds Patient Lines/Drains/Airways Status   Active Line/Drains/Airways    Name:   Placement date:   Placement time:   Site:   Days:   Peripheral IV 12/07/18 Right Antecubital   12/07/18    1647    Antecubital   less than 1   Airway   08/10/16    1007     849   Incision (Closed) 08/10/16 Abdomen Other (Comment)   08/10/16    0937     849   Incision - 4 Ports Abdomen Right;Medial Right;Lateral Umbilicus Mid;Upper   57/84/69    0933     849          Labs/Imaging Results for orders placed or performed during the hospital encounter of 12/07/18 (from the past 48 hour(s))  Lipase, blood     Status: None   Collection Time: 12/07/18  4:24 PM  Result Value Ref Range   Lipase 27 11 - 51 U/L    Comment: Performed at [facility], [place] [address] [place], [place] [address]  Comprehensive metabolic panel     Status: Abnormal   Collection Time: 12/07/18  4:24 PM  Result Value Ref Range   Sodium 137 135 - 145 mmol/L   Potassium 3.6 3.5 - 5.1 mmol/L   Chloride 99 98 - 111 mmol/L   CO2 23 22 - 32 mmol/L   Glucose, Bld 179 (H) 70 - 99 mg/dL   BUN 21 8 - 23 mg/dL   Creatinine, Ser 1.31 (H) 0.44 - 1.00 mg/dL   Calcium 9.3 8.9 - 10.3 mg/dL   Total Protein 8.2 (H) 6.5 - 8.1 g/dL   Albumin 4.3 3.5 - 5.0 g/dL   AST 18 15 - 41 U/L   ALT 22 0 - 44 U/L   Alkaline Phosphatase 62 38 - 126 U/L   Total Bilirubin 1.2 0.3 - 1.2 mg/dL   GFR calc non Af Amer 41 (L) >60 mL/min   GFR calc Af Amer 48 (L) >60 mL/min   Anion gap 15 5 - 15    Comment: Performed at [facility], [place] [address] [place], [place] [address]  CBC     Status: Abnormal   Collection Time: 12/07/18  4:24 PM  Result Value Ref Range   WBC 9.8 4.0 - 10.5 K/uL   RBC 5.14  (H) 3.87 - 5.11 MIL/uL   Hemoglobin 15.3 (H) 12.0 - 15.0 g/dL   HCT 48.8 (H) 36.0 - 46.0 %   MCV 94.9 80.0 - 100.0 fL   MCH 29.8 26.0 - 34.0 pg   MCHC 31.4 30.0 - 36.0 g/dL   RDW 13.2 11.5 - 15.5 %   Platelets 229 150 - 400 K/uL   nRBC 0.0 0.0 - 0.2 %    Comment: Performed at [facility], [place] [address] [place], [place] [address]   Ct Abdomen Pelvis W Contrast  Result Date: 12/07/2018 CLINICAL DATA:  Diffuse abdominal pain.  History of Crohn's disease. EXAM: CT ABDOMEN AND PELVIS WITH CONTRAST TECHNIQUE: Multidetector CT imaging of the abdomen and pelvis was performed using the standard protocol following bolus administration of intravenous contrast. CONTRAST:  53mL ISOVUE-300 IOPAMIDOL (ISOVUE-300) INJECTION 61% COMPARISON:  CT scan June 11, 2016. FINDINGS: Lower chest: No acute abnormality. Hepatobiliary: Status post cholecystectomy. No biliary dilatation is noted. Probable fatty infiltration of the liver. Pancreas: Unremarkable. No pancreatic ductal dilatation or surrounding inflammatory changes. Spleen: Normal in size without focal abnormality. Adrenals/Urinary Tract: Adrenal glands appear normal. Nonobstructive left nephrolithiasis is noted. Bilateral renal cysts are noted. No hydronephrosis or renal obstruction is noted. Urinary bladder is unremarkable. Stomach/Bowel: The stomach appears normal. Status post appendectomy. Colon is nondilated. There is seen wall thickening involving the terminal ileum consistent with history of Crohn's disease, although acute inflammation is not visualized. There is noted mild dilatation of distal small bowel loops. Vascular/Lymphatic: Aortic atherosclerosis. No enlarged abdominal or pelvic lymph nodes. Reproductive: Uterus and bilateral adnexa are unremarkable. Other: No abdominal wall hernia or abnormality. No abdominopelvic ascites. Musculoskeletal: No acute or significant osseous findings. IMPRESSION: Wall thickening of terminal ileum is  noted consistent with Crohn's disease, although no definite inflammatory changes are noted to suggest acute inflammation. There is noted mild distal small bowel dilatation suggesting partial obstruction. Probable fatty infiltration of the liver. Nonobstructive left nephrolithiasis. Aortic Atherosclerosis (ICD10-I70.0). Electronically Signed   By: Marijo Conception, M.D.   On: 12/07/2018 19:01   None  Pending Labs Unresulted Labs (From admission, onward)    Start     Ordered   12/07/18 1624  Urinalysis, Routine w reflex microscopic  ONCE - STAT,   STAT  12/07/18 1623          Vitals/Pain Today's Vitals   12/07/18 1800 12/07/18 1824 12/07/18 1830 12/07/18 1900  BP: 120/78 120/78 (!) 143/77 131/78  Pulse: 92 92 90 89  Resp:  14    Temp:      TempSrc:      SpO2: 99% 99% 97% 98%  Weight:      PainSc:        Isolation Precautions No active isolations  Medications Medications  sodium chloride 0.9 % bolus 1,000 mL (0 mLs Intravenous Stopped 12/07/18 1821)  ondansetron (ZOFRAN) injection 4 mg (4 mg Intravenous Given 12/07/18 1704)  iopamidol (ISOVUE-300) 61 % injection 100 mL (80 mLs Intravenous Contrast Given 12/07/18 1834)  methylPREDNISolone sodium succinate (SOLU-MEDROL) 40 mg/mL injection 40 mg (40 mg Intravenous Given 12/07/18 1954)  0.9 %  sodium chloride infusion ( Intravenous New Bag/Given 12/07/18 2000)    Mobility walks with device

## 2018-12-07 NOTE — ED Notes (Signed)
Pt cannot use restroom at this time, aware urine specimen is needed.  

## 2018-12-07 NOTE — H&P (Signed)
History and Physical    Alexis Ramos BDZ:[id number] DOB: 03/05/1951 DOA: 12/07/2018  PCP: [name], MD  Patient coming from: Home  I have personally briefly reviewed patient's old medical records in [place]  Chief Complaint: Lower abdominal discomfort, nausea  HPI: Alexis Ramos is a 67 y.o. female with medical history significant for Crohn's disease with prior bowel resection, history partial SBO, hypertension, CKD stage III, and depression who presents with 1 day of nausea without emesis and lower abdominal pain.  She felt her symptoms are similar to her prior Crohn's flareup and partial small bowel obstruction.  Previously she has been doing well for about 7 years.  She reports chronic diarrhea which is unchanged except for slight yellow discoloration.  She follows with Dr. Cristina Gong with [facility].  She is not on any medications for Crohn's disease at this time.  ED Course:  Initial vitals showed BP 157/95, pulse 106, RR 16, temp 97.9 Fahrenheit, SPO2 97% on room air.  Labs are notable for WBC 9.8, hemoglobin 15.3, platelets 229, creatinine 1.31 at her baseline, lipase 27.  CT abdomen pelvis with contrast was obtained which showed wall thickening of the terminal ileum consistent with Crohn's disease.  Mild distal small bowel dilatation suggesting partial obstruction was also noted.  The ED physician discussed the case with on-call GI who recommended Solu-Medrol 40 mg every 12 hours and admission.  She was given IV Solu-Medrol 40 mg once and 1 L normal saline and the hospitalist service was consulted for admission.  Review of Systems: As per HPI otherwise 10 point review of systems negative.    Past Medical History:  Diagnosis Date  . Arthritis   . Cancer ([place])    melanoma on back   . Crohn's disease ([place])   . Depression   . DES exposure in utero   . Hypertension   . Menopausal symptoms     Past Surgical History:  Procedure Laterality Date  . Anal  abscess surg    . anal sphincterectomy    . APPENDECTOMY    . BOWEL RESECTION    . BREAST BIOPSY Left   . BREAST SURGERY     Biopsy-Benign  . CERVICAL FUSION    . CHOLECYSTECTOMY N/A 08/10/2016   Procedure: LAPAROSCOPIC CHOLECYSTECTOMY WITH INTRAOPERATIVE CHOLANGIOGRAM -STANDARD 4 PORT;  Surgeon: [name], MD;  Location: [facility];  Service: General;  Laterality: N/A;  . DILATION AND CURETTAGE OF UTERUS    . HAND SURGERY    . KIDNEY SURGERY    . KNEE SURGERY    . LAPAROSCOPIC LYSIS OF ADHESIONS N/A 08/10/2016   Procedure: LAPAROSCOPIC LYSIS OF ADHESIONS WITH CORE LIVER BIOPSY;  Surgeon: [name], MD;  Location: [facility];  Service: General;  Laterality: N/A;  . NASAL SINUS SURGERY    . Thumb surg    . TONGUE BASE REDUCTION SOMNOPLASTY     Removed uvula  . TONSILLECTOMY AND ADENOIDECTOMY       reports that she has never smoked. She has never used smokeless tobacco. She reports that she does not drink alcohol or use drugs.  Allergies  Allergen Reactions  . Penicillins Anaphylaxis    Pt grandmother died from it and her brother had anaphylaxis so she was told never to take.  Has patient had a PCN reaction causing immediate rash, facial/tongue/throat swelling, SOB or lightheadedness with hypotension: No Has patient had a PCN reaction causing severe rash involving mucus membranes or skin necrosis: No Has patient  had a PCN reaction that required hospitalization No Has patient had a PCN reaction occurring within the last 10 years: No If all of the above answers are "NO", then may pr  . Advil [Ibuprofen]     Bleeding.   Diona Fanti [Aspirin]     Hemorrhage.     Lanae Crumbly [Oxaprozin]     Bleeding.  . Nsaids Other (See Comments)    H/o Crohn's disease    Family History  Problem Relation Age of Onset  . Diabetes Mother   . Hypertension Mother   . Osteoporosis Mother   . Hypertension Father   . Heart disease Father   . Prostate cancer Father   . Diabetes Brother   . Hypertension  Brother   . Cancer Other        Niece-Glioblastoma  . Breast cancer Neg Hx      Prior to Admission medications   Medication Sig Start Date End Date Taking? Authorizing Provider  amLODipine (NORVASC) 5 MG tablet Take 5 mg by mouth daily. 02/28/16  Yes [provider]  Cholecalciferol (VITAMIN D-3) 5000 units TABS Take 5,000 Units by mouth daily.   Yes [provider]  Cyanocobalamin (VITAMIN B-12 IJ) Inject as directed every 21 ( twenty-one) days.    Yes [provider]  losartan (COZAAR) 100 MG tablet Take 100 mg by mouth daily.   Yes [provider]  MEGARED OMEGA-3 KRILL OIL PO Take 1 tablet by mouth daily.   Yes [provider]  metoprolol succinate (TOPROL-XL) 25 MG 24 hr tablet Take 25 mg by mouth daily. 05/01/16  Yes [provider]  venlafaxine (EFFEXOR) 75 MG tablet Take 75 mg by mouth 5 (five) times daily.  05/16/16  Yes [provider]  Vitamin E 400 units TABS Take 800 Units by mouth daily.   Yes [provider]    Physical Exam: Vitals:   12/07/18 1930 12/07/18 2000 12/07/18 2041 12/07/18 2133  BP: 118/73 126/71 126/70 139/82  Pulse: 88 89 90 88  Resp:   14 16  Temp:   97.9 F (36.6 C) 99.1 F (37.3 C)  TempSrc:   Oral Oral  SpO2: 97% 98% 100% 96%  Weight:    53.1 kg  Height:    4' 11"  (1.499 m)    Constitutional: NAD, calm, comfortable Eyes: PERRL, lids and conjunctivae normal ENMT: Mucous membranes are moist. Posterior pharynx clear of any exudate or lesions. Normal dentition.  Neck: normal, supple, no masses. Respiratory: clear to auscultation bilaterally, no wheezing, no crackles. Normal respiratory effort. No accessory muscle use.  Cardiovascular: Regular rate and rhythm, no murmurs / rubs / gallops. No extremity edema.  Abdomen: Tender to palpation bilateral lower abdominal region, well-healed surgical abdominal scars present, no masses palpated. No hepatosplenomegaly. Bowel sounds positive.    Musculoskeletal: no clubbing / cyanosis. No joint deformity upper and lower extremities. Good ROM, no contractures. Normal muscle tone.  Skin: no rashes, lesions, ulcers. No induration Neurologic: CN 2-12 grossly intact. Sensation intact. Strength 5/5 in all 4.  Psychiatric: Normal judgment and insight. Alert and oriented x 3. Normal mood.     Labs on Admission: I have personally reviewed following labs and imaging studies  CBC: Recent Labs  Lab 12/07/18 1624  WBC 9.8  HGB 15.3*  HCT 48.8*  MCV 94.9  PLT 021   Basic Metabolic Panel: Recent Labs  Lab 12/07/18 1624  NA 137  K 3.6  CL 99  CO2 23  GLUCOSE 179*  BUN 21  CREATININE 1.31*  CALCIUM 9.3   GFR: Estimated Creatinine Clearance: 30.2 mL/min (A) (by C-G formula based on SCr of 1.31 mg/dL (H)). Liver Function Tests: Recent Labs  Lab 12/07/18 1624  AST 18  ALT 22  ALKPHOS 62  BILITOT 1.2  PROT 8.2*  ALBUMIN 4.3   Recent Labs  Lab 12/07/18 1624  LIPASE 27   No results for input(s): AMMONIA in the last 168 hours. Coagulation Profile: No results for input(s): INR, PROTIME in the last 168 hours. Cardiac Enzymes: No results for input(s): CKTOTAL, CKMB, CKMBINDEX, TROPONINI in the last 168 hours. BNP (last 3 results) No results for input(s): PROBNP in the last 8760 hours. HbA1C: No results for input(s): HGBA1C in the last 72 hours. CBG: No results for input(s): GLUCAP in the last 168 hours. Lipid Profile: No results for input(s): CHOL, HDL, LDLCALC, TRIG, CHOLHDL, LDLDIRECT in the last 72 hours. Thyroid Function Tests: No results for input(s): TSH, T4TOTAL, FREET4, T3FREE, THYROIDAB in the last 72 hours. Anemia Panel: No results for input(s): VITAMINB12, FOLATE, FERRITIN, TIBC, IRON, RETICCTPCT in the last 72 hours. Urine analysis:    Component Value Date/Time   COLORURINE AMBER (A) 06/11/2016 1901   APPEARANCEUR CLEAR 06/11/2016 1901   LABSPEC 1.022 06/11/2016 1901   PHURINE 5.5 06/11/2016 1901    GLUCOSEU NEGATIVE 06/11/2016 1901   HGBUR NEGATIVE 06/11/2016 1901   BILIRUBINUR NEGATIVE 06/11/2016 1901   KETONESUR NEGATIVE 06/11/2016 1901   PROTEINUR 30 (A) 06/11/2016 1901   UROBILINOGEN 0.2 05/16/2012 1226   NITRITE NEGATIVE 06/11/2016 1901   LEUKOCYTESUR NEGATIVE 06/11/2016 1901    Radiological Exams on Admission: Ct Abdomen Pelvis W Contrast  Result Date: 12/07/2018 CLINICAL DATA:  Diffuse abdominal pain.  History of Crohn's disease. EXAM: CT ABDOMEN AND PELVIS WITH CONTRAST TECHNIQUE: Multidetector CT imaging of the abdomen and pelvis was performed using the standard protocol following bolus administration of intravenous contrast. CONTRAST:  39m ISOVUE-300 IOPAMIDOL (ISOVUE-300) INJECTION 61% COMPARISON:  CT scan June 11, 2016. FINDINGS: Lower chest: No acute abnormality. Hepatobiliary: Status post cholecystectomy. No biliary dilatation is noted. Probable fatty infiltration of the liver. Pancreas: Unremarkable. No pancreatic ductal dilatation or surrounding inflammatory changes. Spleen: Normal in size without focal abnormality. Adrenals/Urinary Tract: Adrenal glands appear normal. Nonobstructive left nephrolithiasis is noted. Bilateral renal cysts are noted. No hydronephrosis or renal obstruction is noted. Urinary bladder is unremarkable. Stomach/Bowel: The stomach appears normal. Status post appendectomy. Colon is nondilated. There is seen wall thickening involving the terminal ileum consistent with history of Crohn's disease, although acute inflammation is not visualized. There is noted mild dilatation of distal small bowel loops. Vascular/Lymphatic: Aortic atherosclerosis. No enlarged abdominal or pelvic lymph nodes. Reproductive: Uterus and bilateral adnexa are unremarkable. Other: No abdominal wall hernia or abnormality. No abdominopelvic ascites. Musculoskeletal: No acute or significant osseous findings. IMPRESSION: Wall thickening of terminal ileum is noted consistent with Crohn's  disease, although no definite inflammatory changes are noted to suggest acute inflammation. There is noted mild distal small bowel dilatation suggesting partial obstruction. Probable fatty infiltration of the liver. Nonobstructive left nephrolithiasis. Aortic Atherosclerosis (ICD10-I70.0). Electronically Signed   By: JMarijo Conception M.D.   On: 12/07/2018 19:01   Assessment/Plan Principal Problem:   Crohn's disease involving terminal ileum ([place] Active Problems:   Hypertension   Chronic kidney disease, stage III (moderate) ([place]   Depression   JMALEEHA HALLSis a 67y.o. female with medical history significant for Crohn's disease with prior  bowel resection, history partial SBO, hypertension, CKD stage III, and depression who presents with 1 day of nausea without emesis and lower abdominal pain admitted with partial SBO in the setting of Crohn's disease.  Crohn's disease involving terminal ileum with partial SBO: -Continue IV Solu-Medrol 40 mg every 12 hours -N.p.o. except for sips -No NG tube at this time -Maintenance IV fluids overnight -GI consulted and to see in a.m.  Hypertension: -Continue home amlodipine, losartan, Toprol-XL  CKD stage III: Renal function at baseline. -Continue to monitor  Depression: -Continue home venlafaxine   DVT prophylaxis: subq heparin Code Status: DNR, confirmed with patient Family Communication: Discussed with patient and friend at bedside Disposition Plan: Pending clinical progress and improvement in nausea and ability to maintain adequate oral intake Consults called: GI consulted by EDP Admission status: Inpatient   Zada Finders MD [facility] Pager [phone]  If 7PM-7AM, please contact night-coverage [email or web address] Password [facility]  12/08/2018, 12:37 AM

## 2018-12-07 NOTE — ED Triage Notes (Signed)
Pt reports crohn's disease flare up which started yesterday with clear watery diarrhea.  Denies any n/v.  Was instructed by her PCP to come to the ED for IVF.  Pt reports diffused abd pain-non-radiating.  She also reports nausea.

## 2018-12-07 NOTE — ED Notes (Signed)
ED Provider at bedside. 

## 2018-12-08 LAB — BASIC METABOLIC PANEL
ANION GAP: 11 (ref 5–15)
BUN: 19 mg/dL (ref 8–23)
CO2: 20 mmol/L — ABNORMAL LOW (ref 22–32)
Calcium: 8.3 mg/dL — ABNORMAL LOW (ref 8.9–10.3)
Chloride: 107 mmol/L (ref 98–111)
Creatinine, Ser: 1.1 mg/dL — ABNORMAL HIGH (ref 0.44–1.00)
GFR calc Af Amer: 59 mL/min — ABNORMAL LOW (ref >60)
GFR calc non Af Amer: 51 mL/min — ABNORMAL LOW (ref >60)
Glucose, Bld: 157 mg/dL — ABNORMAL HIGH (ref 70–99)
POTASSIUM: 3.8 mmol/L (ref 3.5–5.1)
Sodium: 138 mmol/L (ref 135–145)

## 2018-12-08 LAB — CBC
HCT: 40.9 % (ref 36.0–46.0)
HEMOGLOBIN: 12.6 g/dL (ref 12.0–15.0)
MCH: 29.4 pg (ref 26.0–34.0)
MCHC: 30.8 g/dL (ref 30.0–36.0)
MCV: 95.3 fL (ref 80.0–100.0)
Platelets: 180 10*3/uL (ref 150–400)
RBC: 4.29 MIL/uL (ref 3.87–5.11)
RDW: 13.1 % (ref 11.5–15.5)
WBC: 8.8 10*3/uL (ref 4.0–10.5)
nRBC: 0 % (ref 0.0–0.2)

## 2018-12-08 LAB — HIV ANTIBODY (ROUTINE TESTING W REFLEX): HIV Screen 4th Generation wRfx: NONREACTIVE

## 2018-12-08 MED ORDER — VENLAFAXINE HCL 75 MG PO TABS: 150.0000 mg | ORAL_TABLET | Freq: Two times a day (BID) | ORAL | Status: DC

## 2018-12-08 MED ORDER — VENLAFAXINE HCL 75 MG PO TABS
75.0000 mg | ORAL_TABLET | Freq: Every day | ORAL | Status: DC
  Administered 2018-12-09 – 2018-12-12 (×4): 75 mg via ORAL
  Filled 2018-12-08 (×4): qty 1

## 2018-12-08 MED ORDER — VENLAFAXINE HCL 75 MG PO TABS
150.0000 mg | ORAL_TABLET | Freq: Two times a day (BID) | ORAL | Status: DC
  Administered 2018-12-08 – 2018-12-13 (×10): 150 mg via ORAL
  Filled 2018-12-08 (×12): qty 2

## 2018-12-08 NOTE — Consult Note (Signed)
[facility] Gastroenterology Consultation Note  Referring Provider:  Dr. Marthenia Rolling [facility]) Primary Care Physician:  [name], MD Primary Gastroenterologist:  Dr. [name]  Reason for Consultation:  Nausea, vomiting, abnormal CT   HPI: Alexis Ramos is a 67 y.o. female with 40 year history Crohn's enteritis.  3 prior surgeries, last about 20 years ago.  No medical therapy other than occasional steroids (last 2012?) for her Crohn's disease.  Couple days ago with some nausea, malaise, abdominal distention, some vomiting.  CT showed stricture neoterminal ileum and dilated upstream consistent with partial bowel obstruction.  Given steroids overnight.  She feels much better; passing loose stool and flatus; nausea better; is hungry.   Past Medical History:  Diagnosis Date  . Arthritis   . Cancer ([place])    melanoma on back   . Crohn's disease ([place])   . Depression   . DES exposure in utero   . Hypertension   . Menopausal symptoms     Past Surgical History:  Procedure Laterality Date  . Anal abscess surg    . anal sphincterectomy    . APPENDECTOMY    . BOWEL RESECTION    . BREAST BIOPSY Left   . BREAST SURGERY     Biopsy-Benign  . CERVICAL FUSION    . CHOLECYSTECTOMY N/A 08/10/2016   Procedure: LAPAROSCOPIC CHOLECYSTECTOMY WITH INTRAOPERATIVE CHOLANGIOGRAM -STANDARD 4 PORT;  Surgeon: [name], MD;  Location: [facility] ORS;  Service: General;  Laterality: N/A;  . DILATION AND CURETTAGE OF UTERUS    . HAND SURGERY    . KIDNEY SURGERY    . KNEE SURGERY    . LAPAROSCOPIC LYSIS OF ADHESIONS N/A 08/10/2016   Procedure: LAPAROSCOPIC LYSIS OF ADHESIONS WITH CORE LIVER BIOPSY;  Surgeon: [name], MD;  Location: [facility];  Service: General;  Laterality: N/A;  . NASAL SINUS SURGERY    . Thumb surg    . TONGUE BASE REDUCTION SOMNOPLASTY     Removed uvula  . TONSILLECTOMY AND ADENOIDECTOMY      Prior to Admission medications   Medication Sig Start Date End Date Taking? Authorizing  Provider  amLODipine (NORVASC) 5 MG tablet Take 5 mg by mouth daily. 02/28/16  Yes [provider]  Cholecalciferol (VITAMIN D-3) 5000 units TABS Take 5,000 Units by mouth daily.   Yes [provider]  Cyanocobalamin (VITAMIN B-12 IJ) Inject as directed every 21 ( twenty-one) days.    Yes [provider]  losartan (COZAAR) 100 MG tablet Take 100 mg by mouth daily.   Yes [provider]  MEGARED OMEGA-3 KRILL OIL PO Take 1 tablet by mouth daily.   Yes [provider]  metoprolol succinate (TOPROL-XL) 25 MG 24 hr tablet Take 25 mg by mouth daily. 05/01/16  Yes [provider]  venlafaxine (EFFEXOR) 75 MG tablet Take 75 mg by mouth 5 (five) times daily.  05/16/16  Yes [provider]  Vitamin E 400 units TABS Take 800 Units by mouth daily.   Yes [provider]    Current Facility-Administered Medications  Medication Dose Route Frequency Provider Last Rate Last Dose  . acetaminophen (TYLENOL) tablet 650 mg  650 mg Oral Q6H PRN Lenore Cordia, MD       Or  . acetaminophen (TYLENOL) suppository 650 mg  650 mg Rectal Q6H PRN Zada Finders R, MD      . amLODipine (NORVASC) tablet 5 mg  5 mg Oral Daily Lenore Cordia, MD   5 mg at  12/08/18 1105  . heparin injection 5,000 Units  5,000 Units Subcutaneous Q8H Lenore Cordia, MD   5,000 Units at 12/08/18 0524  . losartan (COZAAR) tablet 100 mg  100 mg Oral Daily Zada Finders R, MD   100 mg at 12/08/18 1105  . methylPREDNISolone sodium succinate (SOLU-MEDROL) 40 mg/mL injection 40 mg  40 mg Intravenous Q12H Lenore Cordia, MD   40 mg at 12/08/18 0813  . metoprolol succinate (TOPROL-XL) 24 hr tablet 25 mg  25 mg Oral Daily Zada Finders R, MD   25 mg at 12/08/18 1105  . ondansetron (ZOFRAN) tablet 4 mg  4 mg Oral Q6H PRN Lenore Cordia, MD       Or  . ondansetron (ZOFRAN) injection 4 mg  4 mg Intravenous Q6H PRN Zada Finders R, MD      . venlafaxine [facility]) tablet 75 mg  75 mg  Oral 5 X Daily Lenore Cordia, MD   75 mg at 12/08/18 1105    Allergies as of 12/07/2018 - Review Complete 12/07/2018  Allergen Reaction Noted  . Penicillins Anaphylaxis 05/10/2012  . Advil [ibuprofen]  05/10/2012  . Asa [aspirin]  05/10/2012  . Daypro [oxaprozin]  05/10/2012  . Nsaids Other (See Comments) 08/10/2016    Family History  Problem Relation Age of Onset  . Diabetes Mother   . Hypertension Mother   . Osteoporosis Mother   . Hypertension Father   . Heart disease Father   . Prostate cancer Father   . Diabetes Brother   . Hypertension Brother   . Cancer Other        Niece-Glioblastoma  . Breast cancer Neg Hx     Social History   Socioeconomic History  . Marital status: Single    Spouse name: Not on file  . Number of children: Not on file  . Years of education: Not on file  . Highest education level: Not on file  Occupational History  . Not on file  Social Needs  . Financial resource strain: Not on file  . Food insecurity:    Worry: Not on file    Inability: Not on file  . Transportation needs:    Medical: Not on file    Non-medical: Not on file  Tobacco Use  . Smoking status: Never Smoker  . Smokeless tobacco: Never Used  Substance and Sexual Activity  . Alcohol use: No  . Drug use: No  . Sexual activity: Never    Birth control/protection: Post-menopausal  Lifestyle  . Physical activity:    Days per week: Not on file    Minutes per session: Not on file  . Stress: Not on file  Relationships  . Social connections:    Talks on phone: Not on file    Gets together: Not on file    Attends religious service: Not on file    Active member of club or organization: Not on file    Attends meetings of clubs or organizations: Not on file    Relationship status: Not on file  . Intimate partner violence:    Fear of current or ex partner: Not on file    Emotionally abused: Not on file    Physically abused: Not on file    Forced sexual activity: Not on  file  Other Topics Concern  . Not on file  Social History Narrative  . Not on file    Review of Systems: As per HPI, all others negative  Physical Exam:  Vital signs in last 24 hours: Temp:  [97.9 F (36.6 C)-99.1 F (37.3 C)] 97.9 F (36.6 C) (12/28 0524) Pulse Rate:  [75-106] 75 (12/28 0524) Resp:  [14-16] 16 (12/28 0524) BP: (118-157)/(68-95) 123/68 (12/28 0524) SpO2:  [94 %-100 %] 94 % (12/28 0524) Weight:  [53.1 kg] 53.1 kg (12/27 2133) Last BM Date: 12/08/18 General:   Alert,  Well-developed, well-nourished, pleasant and cooperative in NAD Head:  Normocephalic and atraumatic. Eyes:  Sclera clear, no icterus.   Conjunctiva pink. Ears:  Normal auditory acuity. Nose:  No deformity, discharge,  or lesions. Mouth:  No deformity or lesions.  Oropharynx pink & moist. Neck:  Supple; no masses or thyromegaly. Lungs:  Clear throughout to auscultation.   No wheezes, crackles, or rhonchi. No acute distress. Heart:  Regular rate and rhythm; no murmurs, clicks, rubs,  or gallops. Abdomen:  Soft, non-tender, mild distention. No masses, hepatosplenomegaly or hernias noted. Normal bowel sounds, without guarding, and without rebound.     Msk:  Symmetrical without gross deformities. Normal posture. Pulses:  Normal pulses noted. Extremities:  Without clubbing or edema. Neurologic:  Alert and  oriented x4;  grossly normal neurologically. Skin:  Intact without significant lesions or rashes. Cervical Nodes:  No significant cervical adenopathy. Psych:  Alert and cooperative. Normal mood and affect.   Lab Results: Recent Labs    12/07/18 1624 12/08/18 0432  WBC 9.8 8.8  HGB 15.3* 12.6  HCT 48.8* 40.9  PLT 229 180   BMET Recent Labs    12/07/18 1624 12/08/18 0432  NA 137 138  K 3.6 3.8  CL 99 107  CO2 23 20*  GLUCOSE 179* 157*  BUN 21 19  CREATININE 1.31* 1.10*  CALCIUM 9.3 8.3*   LFT Recent Labs    12/07/18 1624  PROT 8.2*  ALBUMIN 4.3  AST 18  ALT 22  ALKPHOS 62   BILITOT 1.2   PT/INR No results for input(s): LABPROT, INR in the last 72 hours.  Studies/Results: Ct Abdomen Pelvis W Contrast  Result Date: 12/07/2018 CLINICAL DATA:  Diffuse abdominal pain.  History of Crohn's disease. EXAM: CT ABDOMEN AND PELVIS WITH CONTRAST TECHNIQUE: Multidetector CT imaging of the abdomen and pelvis was performed using the standard protocol following bolus administration of intravenous contrast. CONTRAST:  55mL ISOVUE-300 IOPAMIDOL (ISOVUE-300) INJECTION 61% COMPARISON:  CT scan June 11, 2016. FINDINGS: Lower chest: No acute abnormality. Hepatobiliary: Status post cholecystectomy. No biliary dilatation is noted. Probable fatty infiltration of the liver. Pancreas: Unremarkable. No pancreatic ductal dilatation or surrounding inflammatory changes. Spleen: Normal in size without focal abnormality. Adrenals/Urinary Tract: Adrenal glands appear normal. Nonobstructive left nephrolithiasis is noted. Bilateral renal cysts are noted. No hydronephrosis or renal obstruction is noted. Urinary bladder is unremarkable. Stomach/Bowel: The stomach appears normal. Status post appendectomy. Colon is nondilated. There is seen wall thickening involving the terminal ileum consistent with history of Crohn's disease, although acute inflammation is not visualized. There is noted mild dilatation of distal small bowel loops. Vascular/Lymphatic: Aortic atherosclerosis. No enlarged abdominal or pelvic lymph nodes. Reproductive: Uterus and bilateral adnexa are unremarkable. Other: No abdominal wall hernia or abnormality. No abdominopelvic ascites. Musculoskeletal: No acute or significant osseous findings. IMPRESSION: Wall thickening of terminal ileum is noted consistent with Crohn's disease, although no definite inflammatory changes are noted to suggest acute inflammation. There is noted mild distal small bowel dilatation suggesting partial obstruction. Probable fatty infiltration of the liver. Nonobstructive  left nephrolithiasis. Aortic Atherosclerosis (ICD10-I70.0). Electronically Signed   By: Jeneen Rinks  Murlean Caller, M.D.   On: 12/07/2018 19:01    Impression:  1.  Abdominal distention and nausea +/- scant vomiting. 2.  Abnormal imaging, CT. 3.  Crohn's enteritis. 4.  Overall presentation most consistent with partial small bowel obstruction, suspect predominantly from fibrostenotic terminal ileal stricturing, though perhaps some degree of inflammatory stenosis.  Patient much improved.  Plan:  1.  IV steroids another day. 2.  Clear liquid diet; if ok with this, consider full liquids tonight. 3.  If continues to improve, consider advance to soft diet and oral steroids tomorrow. 4.  If continues to improve, consider discharge home in 1-2 days.   LOS: 1 day   Nina Hoar M  12/08/2018, 12:31 PM  Cell [phone] If no answer or after 5 PM call [phone]

## 2018-12-08 NOTE — Progress Notes (Signed)
PROGRESS NOTE    Alexis Ramos  FFM:[id number] DOB: 1951/04/06 DOA: 12/07/2018 PCP: [name], MD  Outpatient Specialists:   Brief Narrative: As per H&P "Alexis Ramos is a 67 y.o. female with medical history significant for Crohn's disease with prior bowel resection, history partial SBO, hypertension, CKD stage III, and depression who presents with 1 day of nausea without emesis and lower abdominal pain.  She felt her symptoms are similar to her prior Crohn's flareup and partial small bowel obstruction.  Previously she has been doing well for about 7 years.  She reports chronic diarrhea which is unchanged except for slight yellow discoloration.  She follows with Dr. Cristina Gong with [facility].  She is not on any medications for Crohn's disease at this time.  ED Course:  Initial vitals showed BP 157/95, pulse 106, RR 16, temp 97.9 Fahrenheit, SPO2 97% on room air.  Labs are notable for WBC 9.8, hemoglobin 15.3, platelets 229, creatinine 1.31 at her baseline, lipase 27.  CT abdomen pelvis with contrast was obtained which showed wall thickening of the terminal ileum consistent with Crohn's disease.  Mild distal small bowel dilatation suggesting partial obstruction was also noted.  The ED physician discussed the case with on-call GI who recommended Solu-Medrol 40 mg every 12 hours and admission.  She was given IV Solu-Medrol 40 mg once and 1 L normal saline and the hospitalist service was consulted for admission".  12/08/2018: Patient seen alongside patient's nurse.  No new complaints.  Patient has a lot of confidence in the GI team.  GI input is highly appreciated.  GI has advised continuing IV steroids for another 1 day, starting clear liquid diet and if patient continues to do well, to advance to soft diet.  We will let the GI team direct patient's care.  No new complaints from the patient.  Patient reports improvement.   Assessment & Plan:   Principal Problem:   Crohn's disease  involving terminal ileum ([place]) Active Problems:   Hypertension   Chronic kidney disease, stage III (moderate) ([place])   Depression  Crohn's disease involving terminal ileum with partial SBO: -Continue IV Solu-Medrol 40 mg every 12 hours -N.p.o. except for sips -No NG tube at this time -Maintenance IV fluids overnight -GI consulted and to see in a.m. 12/08/2018: GI team is directing care.  Continue IV steroids for 1 more day.  Clear liquid, and if tolerated, advance to soft diet.  Eventually transitioned to oral steroids.  Hopefully, patient will be discharged back on [place].  Hypertension: -Continue home amlodipine, losartan, Toprol-XL 12/08/2018: Continue to optimize.  CKD stage III: Renal function at baseline. -Continue to monitor  Depression: -Continue home venlafaxine   DVT prophylaxis: subq heparin Code Status: DNR, confirmed with patient Family Communication: Discussed with patient and friend at bedside Disposition Plan: Pending clinical progress and improvement in nausea and ability to maintain adequate oral intake Consults called: GI consulted by EDP   Procedures:   None.  Antimicrobials:   None   Subjective: No new complaints. Abdominal symptoms slowly improving. No fever or chills.  Objective: Vitals:   12/07/18 2041 12/07/18 2133 12/08/18 0524 12/08/18 1300  BP: 126/70 139/82 123/68 (!) 141/71  Pulse: 90 88 75 80  Resp: 14 16 16 17   Temp: 97.9 F (36.6 C) 99.1 F (37.3 C) 97.9 F (36.6 C) 97.9 F (36.6 C)  TempSrc: Oral Oral Oral Oral  SpO2: 100% 96% 94% 99%  Weight:  53.1 kg    Height:  4' 11"  (1.499 m)      Intake/Output Summary (Last 24 hours) at 12/08/2018 1311 Last data filed at 12/08/2018 1021 Gross per 24 hour  Intake 758.64 ml  Output 501 ml  Net 257.64 ml   Filed Weights   12/07/18 1623 12/07/18 2133  Weight: 53.1 kg 53.1 kg    Examination:  General exam: Appears calm and comfortable  Respiratory system: Clear to  auscultation. Respiratory effort normal. Cardiovascular system: S1 & S2. Gastrointestinal system: Abdomen is nondistended, soft and nontender. No organomegaly or masses felt. Normal bowel sounds heard. Central nervous system: Alert and oriented. No focal neurological deficits. Extremities: Symmetric 5 x 5 power.  Data Reviewed: I have personally reviewed following labs and imaging studies  CBC: Recent Labs  Lab 12/07/18 1624 12/08/18 0432  WBC 9.8 8.8  HGB 15.3* 12.6  HCT 48.8* 40.9  MCV 94.9 95.3  PLT 229 332   Basic Metabolic Panel: Recent Labs  Lab 12/07/18 1624 12/08/18 0432  NA 137 138  K 3.6 3.8  CL 99 107  CO2 23 20*  GLUCOSE 179* 157*  BUN 21 19  CREATININE 1.31* 1.10*  CALCIUM 9.3 8.3*   GFR: Estimated Creatinine Clearance: 36 mL/min (A) (by C-G formula based on SCr of 1.1 mg/dL (H)). Liver Function Tests: Recent Labs  Lab 12/07/18 1624  AST 18  ALT 22  ALKPHOS 62  BILITOT 1.2  PROT 8.2*  ALBUMIN 4.3   Recent Labs  Lab 12/07/18 1624  LIPASE 27   No results for input(s): AMMONIA in the last 168 hours. Coagulation Profile: No results for input(s): INR, PROTIME in the last 168 hours. Cardiac Enzymes: No results for input(s): CKTOTAL, CKMB, CKMBINDEX, TROPONINI in the last 168 hours. BNP (last 3 results) No results for input(s): PROBNP in the last 8760 hours. HbA1C: No results for input(s): HGBA1C in the last 72 hours. CBG: No results for input(s): GLUCAP in the last 168 hours. Lipid Profile: No results for input(s): CHOL, HDL, LDLCALC, TRIG, CHOLHDL, LDLDIRECT in the last 72 hours. Thyroid Function Tests: No results for input(s): TSH, T4TOTAL, FREET4, T3FREE, THYROIDAB in the last 72 hours. Anemia Panel: No results for input(s): VITAMINB12, FOLATE, FERRITIN, TIBC, IRON, RETICCTPCT in the last 72 hours. Urine analysis:    Component Value Date/Time   COLORURINE AMBER (A) 06/11/2016 1901   APPEARANCEUR CLEAR 06/11/2016 1901   LABSPEC 1.022  06/11/2016 1901   PHURINE 5.5 06/11/2016 1901   GLUCOSEU NEGATIVE 06/11/2016 1901   HGBUR NEGATIVE 06/11/2016 1901   BILIRUBINUR NEGATIVE 06/11/2016 1901   KETONESUR NEGATIVE 06/11/2016 1901   PROTEINUR 30 (A) 06/11/2016 1901   UROBILINOGEN 0.2 05/16/2012 1226   NITRITE NEGATIVE 06/11/2016 1901   LEUKOCYTESUR NEGATIVE 06/11/2016 1901   Sepsis Labs: @LABRCNTIP (procalcitonin:4,lacticidven:4)  )No results found for this or any previous visit (from the past 240 hour(s)).       Radiology Studies: Ct Abdomen Pelvis W Contrast  Result Date: 12/07/2018 CLINICAL DATA:  Diffuse abdominal pain.  History of Crohn's disease. EXAM: CT ABDOMEN AND PELVIS WITH CONTRAST TECHNIQUE: Multidetector CT imaging of the abdomen and pelvis was performed using the standard protocol following bolus administration of intravenous contrast. CONTRAST:  13m ISOVUE-300 IOPAMIDOL (ISOVUE-300) INJECTION 61% COMPARISON:  CT scan June 11, 2016. FINDINGS: Lower chest: No acute abnormality. Hepatobiliary: Status post cholecystectomy. No biliary dilatation is noted. Probable fatty infiltration of the liver. Pancreas: Unremarkable. No pancreatic ductal dilatation or surrounding inflammatory changes. Spleen: Normal in size without focal abnormality. Adrenals/Urinary Tract: Adrenal  glands appear normal. Nonobstructive left nephrolithiasis is noted. Bilateral renal cysts are noted. No hydronephrosis or renal obstruction is noted. Urinary bladder is unremarkable. Stomach/Bowel: The stomach appears normal. Status post appendectomy. Colon is nondilated. There is seen wall thickening involving the terminal ileum consistent with history of Crohn's disease, although acute inflammation is not visualized. There is noted mild dilatation of distal small bowel loops. Vascular/Lymphatic: Aortic atherosclerosis. No enlarged abdominal or pelvic lymph nodes. Reproductive: Uterus and bilateral adnexa are unremarkable. Other: No abdominal wall hernia or  abnormality. No abdominopelvic ascites. Musculoskeletal: No acute or significant osseous findings. IMPRESSION: Wall thickening of terminal ileum is noted consistent with Crohn's disease, although no definite inflammatory changes are noted to suggest acute inflammation. There is noted mild distal small bowel dilatation suggesting partial obstruction. Probable fatty infiltration of the liver. Nonobstructive left nephrolithiasis. Aortic Atherosclerosis (ICD10-I70.0). Electronically Signed   By: Marijo Conception, M.D.   On: 12/07/2018 19:01        Scheduled Meds: . amLODipine  5 mg Oral Daily  . heparin  5,000 Units Subcutaneous Q8H  . losartan  100 mg Oral Daily  . methylPREDNISolone (SOLU-MEDROL) injection  40 mg Intravenous Q12H  . metoprolol succinate  25 mg Oral Daily  . venlafaxine  75 mg Oral 5 X Daily   Continuous Infusions:   LOS: 1 day    Time spent: 25 minutes    Dana Allan, MD  [facility] Pager #: [phone] 7PM-7AM contact night coverage as above

## 2018-12-09 LAB — GLUCOSE, CAPILLARY
Glucose-Capillary: 168 mg/dL — ABNORMAL HIGH (ref 70–99)
Glucose-Capillary: 80 mg/dL (ref 70–99)

## 2018-12-09 MED ORDER — POTASSIUM CHLORIDE IN NACL 20-0.9 MEQ/L-% IV SOLN
INTRAVENOUS | Status: DC
  Administered 2018-12-09 – 2018-12-11 (×3): via INTRAVENOUS
  Filled 2018-12-09 (×4): qty 1000

## 2018-12-09 NOTE — Progress Notes (Signed)
Initial Nutrition Assessment  INTERVENTION:   -Diet advancement per MD -Once diet is advanced, recommend Boost Breeze po TID, each supplement provides 250 kcal and 9 grams of protein  NUTRITION DIAGNOSIS:   Inadequate oral intake related to acute illness(partial SBO, abdominal distention) as evidenced by (chart review).  GOAL:   Patient will meet greater than or equal to 90% of their needs  MONITOR:   PO intake, Labs, Weight trends, I & O's, Diet advancement  REASON FOR ASSESSMENT:   Malnutrition Screening Tool    ASSESSMENT:   67 y.o. female with medical history significant for Crohn's disease with prior bowel resection, history partial SBO, hypertension, CKD stage III, and depression who presents with 1 day of nausea without emesis and lower abdominal pain.   Patient was on full liquids and consumed ~80% of a full liquid tray this morning. GI then found increased distention and downgraded diet to clear liquids. Per GI, suspect partial SBO. Pt was tolerating clears 12/28.  Once obstruction is improved and diet is advanced, recommend Boost Breeze po TID, each supplement provides 250 kcal and 9 grams of protein  Per weight records, pt has lost 6 lb since March 2018 (5% wt loss x 1.75 years, insignificant for time frame).  Medications reviewed.  Labs reviewed: GFR: 51   NUTRITION - FOCUSED PHYSICAL EXAM:  Nutrition focused physical exam shows no sign of depletion of muscle mass or body fat.  Diet Order:   Diet Order            Diet clear liquid Room service appropriate? Yes; Fluid consistency: Thin  Diet effective now              EDUCATION NEEDS:   No education needs have been identified at this time  Skin:  Skin Assessment: Reviewed RN Assessment  Last BM:  12/29  Height:   Ht Readings from Last 1 Encounters:  12/07/18 4\' 11"  (1.499 m)    Weight:   Wt Readings from Last 1 Encounters:  12/07/18 53.1 kg    Ideal Body Weight:  44.7 kg  BMI:  Body  mass index is 23.63 kg/m.  Estimated Nutritional Needs:   Kcal:  1400-1600  Protein:  65-75g  Fluid:  1.6L/day  Clayton Bibles, MS, RD, LDN [facility] Pager: [phone] After Hours Pager: [phone]

## 2018-12-09 NOTE — Progress Notes (Signed)
Subjective: Feels more distended. No abdominal pain or nausea. Is passing flatus and loose stools.  Objective: Vital signs in last 24 hours: Temp:  [97.5 F (36.4 C)-97.9 F (36.6 C)] 97.5 F (36.4 C) (12/29 0615) Pulse Rate:  [69-82] 82 (12/29 1104) Resp:  [16-17] 16 (12/29 0615) BP: (129-148)/(71-81) 138/81 (12/29 1104) SpO2:  [96 %-100 %] 96 % (12/29 0615) Weight change:  Last BM Date: 12/09/18  PE: GEN:  NAD ABD:  More distended and tympanic; non tender; no peritonitis  Lab Results: CBC    Component Value Date/Time   WBC 8.8 12/08/2018 0432   RBC 4.29 12/08/2018 0432   HGB 12.6 12/08/2018 0432   HCT 40.9 12/08/2018 0432   PLT 180 12/08/2018 0432   MCV 95.3 12/08/2018 0432   MCH 29.4 12/08/2018 0432   MCHC 30.8 12/08/2018 0432   RDW 13.1 12/08/2018 0432   LYMPHSABS 1.5 06/11/2016 1840   MONOABS 1.1 (H) 06/11/2016 1840   EOSABS 0.1 06/11/2016 1840   BASOSABS 0.0 06/11/2016 1840   CMP     Component Value Date/Time   NA 138 12/08/2018 0432   K 3.8 12/08/2018 0432   CL 107 12/08/2018 0432   CO2 20 (L) 12/08/2018 0432   GLUCOSE 157 (H) 12/08/2018 0432   BUN 19 12/08/2018 0432   CREATININE 1.10 (H) 12/08/2018 0432   CALCIUM 8.3 (L) 12/08/2018 0432   CALCIUM 8.7 06/28/2011 0441   PROT 8.2 (H) 12/07/2018 1624   ALBUMIN 4.3 12/07/2018 1624   AST 18 12/07/2018 1624   ALT 22 12/07/2018 1624   ALKPHOS 62 12/07/2018 1624   BILITOT 1.2 12/07/2018 1624   GFRNONAA 51 (L) 12/08/2018 0432   GFRAA 59 (L) 12/08/2018 0432   Assessment:  1.  Abdominal distention. 2.  Abnormal imaging, CT. 3.  Crohn's enteritis. 4.  Overall presentation most consistent with partial small bowel obstruction, suspect predominantly from fibrostenotic terminal ileal stricturing, though perhaps some degree of inflammatory stenosis.  Patient more distended today cf. Yesterday.  Plan:  1.  Continue IV steroids. 2.  Needs IV fluids. 3.  De-escalate diet to clear liquids. 4.  If not  improved tomorrow, would obtain surgical consultation +/- NGT placement.  Patient reports prior colonoscopy about 1-2 years ago, and reports having neoterminal ileal stricture at that time. 5.  [facility] will follow.   Landry Dyke 12/09/2018, 11:26 AM   Cell [phone] If no answer or after 5 PM call [phone]

## 2018-12-09 NOTE — Progress Notes (Signed)
PROGRESS NOTE    Alexis Ramos  GMW:[id number] DOB: 1951/09/08 DOA: 12/07/2018 PCP: [name], MD  Outpatient Specialists:   Brief Narrative: As per H&P "Alexis Ramos is a 67 y.o. female with medical history significant for Crohn's disease with prior bowel resection, history partial SBO, hypertension, CKD stage III, and depression who presents with 1 day of nausea without emesis and lower abdominal pain.  She felt her symptoms are similar to her prior Crohn's flareup and partial small bowel obstruction.  Previously she has been doing well for about 7 years.  She reports chronic diarrhea which is unchanged except for slight yellow discoloration.  She follows with Dr. Cristina Gong with [facility].  She is not on any medications for Crohn's disease at this time.  ED Course:  Initial vitals showed BP 157/95, pulse 106, RR 16, temp 97.9 Fahrenheit, SPO2 97% on room air.  Labs are notable for WBC 9.8, hemoglobin 15.3, platelets 229, creatinine 1.31 at her baseline, lipase 27.  CT abdomen pelvis with contrast was obtained which showed wall thickening of the terminal ileum consistent with Crohn's disease.  Mild distal small bowel dilatation suggesting partial obstruction was also noted.  The ED physician discussed the case with on-call GI who recommended Solu-Medrol 40 mg every 12 hours and admission.  She was given IV Solu-Medrol 40 mg once and 1 L normal saline and the hospitalist service was consulted for admission".  12/08/2018: Patient seen alongside patient's nurse.  No new complaints.  Patient has a lot of confidence in the GI team.  GI input is highly appreciated.  GI has advised continuing IV steroids for another 1 day, starting clear liquid diet and if patient continues to do well, to advance to soft diet.  We will let the GI team direct patient's care.  No new complaints from the patient.  Patient reports improvement.  12/09/2018: Patient seen.  Patient reports bloating of the  abdomen.  GI input is appreciated.  Patient is to continue IV steroids for now.  Low threshold to consult the surgical service, and to insert NG tube.   Assessment & Plan:   Principal Problem:   Crohn's disease involving terminal ileum ([place]) Active Problems:   Hypertension   Chronic kidney disease, stage III (moderate) ([place])   Depression  Crohn's disease involving terminal ileum with partial SBO: -Continue IV Solu-Medrol 40 mg every 12 hours -N.p.o. except for sips -No NG tube at this time -Maintenance IV fluids overnight -GI consulted and to see in a.m. 12/08/2018: GI team is directing care.  Continue IV steroids for 1 more day.  Clear liquid, and if tolerated, advance to soft diet.  Eventually transitioned to oral steroids.   12/09/2018: Kindly see above.  Continue IV steroid.  Only clear liquids for now.  Low threshold to consult the surgical service, to insert NG tube.  Hypertension: -Continue home amlodipine, losartan, Toprol-XL 12/08/2018: Continue to optimize.  CKD stage III: Renal function at baseline. -Continue to monitor  Depression: -Continue home venlafaxine   DVT prophylaxis: subq heparin Code Status: DNR, confirmed with patient Family Communication: Discussed with patient and friend at bedside Disposition Plan: Pending clinical progress and improvement in nausea and ability to maintain adequate oral intake Consults called: GI consulted by EDP   Procedures:   None.  Antimicrobials:   None   Subjective: Abdominal bloating. No nausea or vomiting. Patient still breaks wind.    Objective: Vitals:   12/08/18 2118 12/09/18 0615 12/09/18 1104 12/09/18 1350  BP: (!) 148/78 129/71 138/81 138/79  Pulse: 69 71 82 84  Resp: 16 16  16   Temp: 97.6 F (36.4 C) (!) 97.5 F (36.4 C)  98.9 F (37.2 C)  TempSrc: Oral Oral    SpO2: 100% 96%  97%  Weight:      Height:        Intake/Output Summary (Last 24 hours) at 12/09/2018 1527 Last data filed at  12/09/2018 0939 Gross per 24 hour  Intake 2640 ml  Output 0 ml  Net 2640 ml   Filed Weights   12/07/18 1623 12/07/18 2133  Weight: 53.1 kg 53.1 kg    Examination:  General exam: Appears calm and comfortable  Respiratory system: Clear to auscultation. Respiratory effort normal. Cardiovascular system: S1 & S2. Gastrointestinal system: Abdomen is bloated, soft and nontender. No organomegaly or masses felt. Normal bowel sounds heard. Central nervous system: Alert and oriented. No focal neurological deficits. Extremities: Symmetric 5 x 5 power.  Data Reviewed: I have personally reviewed following labs and imaging studies  CBC: Recent Labs  Lab 12/07/18 1624 12/08/18 0432  WBC 9.8 8.8  HGB 15.3* 12.6  HCT 48.8* 40.9  MCV 94.9 95.3  PLT 229 841   Basic Metabolic Panel: Recent Labs  Lab 12/07/18 1624 12/08/18 0432  NA 137 138  K 3.6 3.8  CL 99 107  CO2 23 20*  GLUCOSE 179* 157*  BUN 21 19  CREATININE 1.31* 1.10*  CALCIUM 9.3 8.3*   GFR: Estimated Creatinine Clearance: 36 mL/min (A) (by C-G formula based on SCr of 1.1 mg/dL (H)). Liver Function Tests: Recent Labs  Lab 12/07/18 1624  AST 18  ALT 22  ALKPHOS 62  BILITOT 1.2  PROT 8.2*  ALBUMIN 4.3   Recent Labs  Lab 12/07/18 1624  LIPASE 27   No results for input(s): AMMONIA in the last 168 hours. Coagulation Profile: No results for input(s): INR, PROTIME in the last 168 hours. Cardiac Enzymes: No results for input(s): CKTOTAL, CKMB, CKMBINDEX, TROPONINI in the last 168 hours. BNP (last 3 results) No results for input(s): PROBNP in the last 8760 hours. HbA1C: No results for input(s): HGBA1C in the last 72 hours. CBG: Recent Labs  Lab 12/08/18 1943 12/09/18 0923  GLUCAP 168* 80   Lipid Profile: No results for input(s): CHOL, HDL, LDLCALC, TRIG, CHOLHDL, LDLDIRECT in the last 72 hours. Thyroid Function Tests: No results for input(s): TSH, T4TOTAL, FREET4, T3FREE, THYROIDAB in the last 72  hours. Anemia Panel: No results for input(s): VITAMINB12, FOLATE, FERRITIN, TIBC, IRON, RETICCTPCT in the last 72 hours. Urine analysis:    Component Value Date/Time   COLORURINE AMBER (A) 06/11/2016 1901   APPEARANCEUR CLEAR 06/11/2016 1901   LABSPEC 1.022 06/11/2016 1901   PHURINE 5.5 06/11/2016 1901   GLUCOSEU NEGATIVE 06/11/2016 1901   HGBUR NEGATIVE 06/11/2016 1901   BILIRUBINUR NEGATIVE 06/11/2016 1901   KETONESUR NEGATIVE 06/11/2016 1901   PROTEINUR 30 (A) 06/11/2016 1901   UROBILINOGEN 0.2 05/16/2012 1226   NITRITE NEGATIVE 06/11/2016 1901   LEUKOCYTESUR NEGATIVE 06/11/2016 1901   Sepsis Labs: @LABRCNTIP (procalcitonin:4,lacticidven:4)  )No results found for this or any previous visit (from the past 240 hour(s)).       Radiology Studies: Ct Abdomen Pelvis W Contrast  Result Date: 12/07/2018 CLINICAL DATA:  Diffuse abdominal pain.  History of Crohn's disease. EXAM: CT ABDOMEN AND PELVIS WITH CONTRAST TECHNIQUE: Multidetector CT imaging of the abdomen and pelvis was performed using the standard protocol following bolus administration of intravenous contrast.  CONTRAST:  36m ISOVUE-300 IOPAMIDOL (ISOVUE-300) INJECTION 61% COMPARISON:  CT scan June 11, 2016. FINDINGS: Lower chest: No acute abnormality. Hepatobiliary: Status post cholecystectomy. No biliary dilatation is noted. Probable fatty infiltration of the liver. Pancreas: Unremarkable. No pancreatic ductal dilatation or surrounding inflammatory changes. Spleen: Normal in size without focal abnormality. Adrenals/Urinary Tract: Adrenal glands appear normal. Nonobstructive left nephrolithiasis is noted. Bilateral renal cysts are noted. No hydronephrosis or renal obstruction is noted. Urinary bladder is unremarkable. Stomach/Bowel: The stomach appears normal. Status post appendectomy. Colon is nondilated. There is seen wall thickening involving the terminal ileum consistent with history of Crohn's disease, although acute  inflammation is not visualized. There is noted mild dilatation of distal small bowel loops. Vascular/Lymphatic: Aortic atherosclerosis. No enlarged abdominal or pelvic lymph nodes. Reproductive: Uterus and bilateral adnexa are unremarkable. Other: No abdominal wall hernia or abnormality. No abdominopelvic ascites. Musculoskeletal: No acute or significant osseous findings. IMPRESSION: Wall thickening of terminal ileum is noted consistent with Crohn's disease, although no definite inflammatory changes are noted to suggest acute inflammation. There is noted mild distal small bowel dilatation suggesting partial obstruction. Probable fatty infiltration of the liver. Nonobstructive left nephrolithiasis. Aortic Atherosclerosis (ICD10-I70.0). Electronically Signed   By: JMarijo Conception M.D.   On: 12/07/2018 19:01        Scheduled Meds: . amLODipine  5 mg Oral Daily  . heparin  5,000 Units Subcutaneous Q8H  . losartan  100 mg Oral Daily  . methylPREDNISolone (SOLU-MEDROL) injection  40 mg Intravenous Q12H  . metoprolol succinate  25 mg Oral Daily  . venlafaxine  150 mg Oral BID WC  . venlafaxine  75 mg Oral QHS   Continuous Infusions: . 0.9 % NaCl with KCl 20 mEq / L 75 mL/hr at 12/09/18 1319     LOS: 2 days    Time spent: 25 minutes    SDana Allan MD  [facility] Pager #: [phone]-7AM contact night coverage as above

## 2018-12-10 ENCOUNTER — Inpatient Hospital Stay (HOSPITAL_COMMUNITY): Payer: Medicare Other

## 2018-12-10 MED ORDER — BOOST / RESOURCE BREEZE PO LIQD CUSTOM
1.0000 | Freq: Three times a day (TID) | ORAL | Status: DC
  Administered 2018-12-10: 20:00:00 via ORAL
  Administered 2018-12-10 – 2018-12-12 (×3): 1 via ORAL

## 2018-12-10 NOTE — Care Management Important Message (Signed)
Important Message  Patient Details  Name: Alexis Ramos MRN: [id number] Date of Birth: 1951-08-16   Medicare Important Message Given:  Yes    Kerin Salen 12/10/2018, 12:59 [place] Message  Patient Details  Name: Alexis Ramos MRN: [id number] Date of Birth: 02-20-1951   Medicare Important Message Given:  Yes    Kerin Salen 12/10/2018, 12:59 PM

## 2018-12-10 NOTE — Progress Notes (Signed)
Subjective: The patient was seen and examined at bedside. Tolerating clear liquids and once her diet to be advanced. Reports passing flatus and having liquid/loose bowel movement almost every hour. Denies nausea, vomiting or abdominal pain.  Objective: Vital signs in last 24 hours: Temp:  [97.6 F (36.4 C)-98.9 F (37.2 C)] 97.6 F (36.4 C) (12/30 0537) Pulse Rate:  [69-84] 69 (12/30 0537) Resp:  [16-18] 18 (12/30 0537) BP: (118-138)/(62-81) 118/62 (12/30 0537) SpO2:  [97 %-100 %] 100 % (12/30 0537) Weight change:  Last BM Date: 12/09/18  PE:no pallor, no icterus GENERAL:moist oral mucosa, not in distress ABDOMEN:soft, non distended, sluggish but audible bowel sounds, non tender EXTREMITIES:no deformity  Lab Results: Results for orders placed or performed during the hospital encounter of 12/07/18 (from the past 48 hour(s))  Glucose, capillary     Status: Abnormal   Collection Time: 12/08/18  7:43 PM  Result Value Ref Range   Glucose-Capillary 168 (H) 70 - 99 mg/dL   Comment 1 Notify RN   Glucose, capillary     Status: None   Collection Time: 12/09/18  9:23 AM  Result Value Ref Range   Glucose-Capillary 80 70 - 99 mg/dL    Studies/Results: No results found.  Medications: I have reviewed the patient's current medications.  Assessment: Wall thickening involving the terminal ileum, acute inflammation not visualized with mild dilatation of distal small bowel,suggesting partial small bowel obstruction. Clinically improving on IV Solu-Medrol 40 mg every 12 hours  Plan: Abdominal x-ray today. Advance diet to full liquid. If continues to make clinical improvement, plan to discharge on tapering dose of steroid. Otherwise, may need surgical evaluation, if there is worsening of abdominal distention/nausea/vomiting. Patient will benefit from Biologics as an outpatient for inflammatory/fibrous stenotic neoterminal ileum from Crohn's disease.   Ronnette Juniper 12/10/2018, 10:02  AM   Pager [phone] If no answer or after 5 PM call [phone]

## 2018-12-10 NOTE — Progress Notes (Signed)
PROGRESS NOTE    Alexis Ramos  QMV:[id number] DOB: [birth year] DOA: 12/07/2018 PCP: [name], MD  Outpatient Specialists:   Brief Narrative: As per H&P "Alexis Ramos is a 67 y.o. female with medical history significant for Crohn's disease with prior bowel resection, history partial SBO, hypertension, CKD stage III, and depression who presents with 1 day of nausea without emesis and lower abdominal pain.  She felt her symptoms are similar to her prior Crohn's flareup and partial small bowel obstruction.  Previously she has been doing well for about 7 years.  She reports chronic diarrhea which is unchanged except for slight yellow discoloration.  She follows with Dr. Cristina Gong with [facility].  She is not on any medications for Crohn's disease at this time.  ED Course:  Initial vitals showed BP 157/95, pulse 106, RR 16, temp 97.9 Fahrenheit, SPO2 97% on room air.  Labs are notable for WBC 9.8, hemoglobin 15.3, platelets 229, creatinine 1.31 at her baseline, lipase 27.  CT abdomen pelvis with contrast was obtained which showed wall thickening of the terminal ileum consistent with Crohn's disease.  Mild distal small bowel dilatation suggesting partial obstruction was also noted.  The ED physician discussed the case with on-call GI who recommended Solu-Medrol 40 mg every 12 hours and admission.  She was given IV Solu-Medrol 40 mg once and 1 L normal saline and the hospitalist service was consulted for admission".  12/08/2018: Patient seen alongside patient's nurse.  No new complaints.  Patient has a lot of confidence in the GI team.  GI input is highly appreciated.  GI has advised continuing IV steroids for another 1 day, starting clear liquid diet and if patient continues to do well, to advance to soft diet.  We will let the GI team direct patient's care.  No new complaints from the patient.  Patient reports improvement.  12/09/2018: Patient seen.  Patient reports bloating of the  abdomen.  GI input is appreciated.  Patient is to continue IV steroids for now.  Low threshold to consult the surgical service, and to insert NG tube.  12/10/2018: Patient seen today.  Patient is better today.  GI input is appreciated.  Patient has been advanced to full liquid diet.  GI team is directing care.  Patient still on IV Solu-Medrol.  Low threshold to consult the surgical team.  Further management will depend on hospital course.   Assessment & Plan:   Principal Problem:   Crohn's disease involving terminal ileum ([place]) Active Problems:   Hypertension   Chronic kidney disease, stage III (moderate) ([place])   Depression  Crohn's disease involving terminal ileum with partial SBO: -Continue IV Solu-Medrol 40 mg every 12 hours -N.p.o. except for sips -No NG tube at this time -Maintenance IV fluids overnight -GI consulted and to see in a.m. 12/08/2018: GI team is directing care.  Continue IV steroids for 1 more day.  Clear liquid, and if tolerated, advance to soft diet.  Eventually transitioned to oral steroids.   12/09/2018: Kindly see above.  Continue IV steroid.  Only clear liquids for now.  Low threshold to consult the surgical service, to insert NG tube. 12/10/2018: Abdominal bloating has improved significantly.  Continue IV Solu-Medrol.  Low threshold to consult surgical team.  Hypertension: -Continue home amlodipine, losartan, Toprol-XL 12/08/2018: Continue to optimize.  CKD stage III: Renal function at baseline. -Continue to monitor  Depression: -Continue home venlafaxine   DVT prophylaxis: subq heparin Code Status: DNR, confirmed with patient Family Communication:  Discussed with patient and friend at bedside Disposition Plan: Pending clinical progress and improvement in nausea and ability to maintain adequate oral intake Consults called: GI consulted by EDP   Procedures:   None.  Antimicrobials:   None   Subjective: Abdominal bloating has  improved. No nausea or vomiting. Patient still breaks wind.    Diet advanced to full liquid diet.  Objective: Vitals:   12/09/18 1350 12/09/18 2105 12/10/18 0537 12/10/18 1405  BP: 138/79 127/75 118/62 (!) 158/81  Pulse: 84 69 69 80  Resp: 16 18 18 17   Temp: 98.9 F (37.2 C) 98.2 F (36.8 C) 97.6 F (36.4 C)   TempSrc:  Oral Oral   SpO2: 97% 100% 100% 99%  Weight:      Height:        Intake/Output Summary (Last 24 hours) at 12/10/2018 1856 Last data filed at 12/10/2018 1807 Gross per 24 hour  Intake 2445 ml  Output 200 ml  Net 2245 ml   Filed Weights   12/07/18 1623 12/07/18 2133  Weight: 53.1 kg 53.1 kg    Examination:  General exam: Appears calm and comfortable  Respiratory system: Clear to auscultation. Respiratory effort normal. Cardiovascular system: S1 & S2. Gastrointestinal system: Abdomen is soft and nontender. No organomegaly or masses felt. Normal bowel sounds heard. Central nervous system: Alert and oriented. No focal neurological deficits. Extremities: Symmetric 5 x 5 power.  Data Reviewed: I have personally reviewed following labs and imaging studies  CBC: Recent Labs  Lab 12/07/18 1624 12/08/18 0432  WBC 9.8 8.8  HGB 15.3* 12.6  HCT 48.8* 40.9  MCV 94.9 95.3  PLT 229 496   Basic Metabolic Panel: Recent Labs  Lab 12/07/18 1624 12/08/18 0432  NA 137 138  K 3.6 3.8  CL 99 107  CO2 23 20*  GLUCOSE 179* 157*  BUN 21 19  CREATININE 1.31* 1.10*  CALCIUM 9.3 8.3*   GFR: Estimated Creatinine Clearance: 36 mL/min (A) (by C-G formula based on SCr of 1.1 mg/dL (H)). Liver Function Tests: Recent Labs  Lab 12/07/18 1624  AST 18  ALT 22  ALKPHOS 62  BILITOT 1.2  PROT 8.2*  ALBUMIN 4.3   Recent Labs  Lab 12/07/18 1624  LIPASE 27   No results for input(s): AMMONIA in the last 168 hours. Coagulation Profile: No results for input(s): INR, PROTIME in the last 168 hours. Cardiac Enzymes: No results for input(s): CKTOTAL, CKMB,  CKMBINDEX, TROPONINI in the last 168 hours. BNP (last 3 results) No results for input(s): PROBNP in the last 8760 hours. HbA1C: No results for input(s): HGBA1C in the last 72 hours. CBG: Recent Labs  Lab 12/08/18 1943 12/09/18 0923  GLUCAP 168* 80   Lipid Profile: No results for input(s): CHOL, HDL, LDLCALC, TRIG, CHOLHDL, LDLDIRECT in the last 72 hours. Thyroid Function Tests: No results for input(s): TSH, T4TOTAL, FREET4, T3FREE, THYROIDAB in the last 72 hours. Anemia Panel: No results for input(s): VITAMINB12, FOLATE, FERRITIN, TIBC, IRON, RETICCTPCT in the last 72 hours. Urine analysis:    Component Value Date/Time   COLORURINE AMBER (A) 06/11/2016 1901   APPEARANCEUR CLEAR 06/11/2016 1901   LABSPEC 1.022 06/11/2016 1901   PHURINE 5.5 06/11/2016 1901   GLUCOSEU NEGATIVE 06/11/2016 1901   HGBUR NEGATIVE 06/11/2016 1901   BILIRUBINUR NEGATIVE 06/11/2016 1901   KETONESUR NEGATIVE 06/11/2016 1901   PROTEINUR 30 (A) 06/11/2016 1901   UROBILINOGEN 0.2 05/16/2012 1226   NITRITE NEGATIVE 06/11/2016 1901   LEUKOCYTESUR NEGATIVE 06/11/2016 1901  Sepsis Labs: @LABRCNTIP (procalcitonin:4,lacticidven:4)  )No results found for this or any previous visit (from the past 240 hour(s)).       Radiology Studies: Dg Abd 1 View  Result Date: 12/10/2018 CLINICAL DATA:  Abdominal distension, small bowel obstruction. EXAM: ABDOMEN - 1 VIEW COMPARISON:  Radiographs of February 03, 2017. CT scan of December 07, 2018. FINDINGS: Status post cholecystectomy. No colonic dilatation is noted. Surgical sutures are noted in the abdomen. No significant small bowel dilatation is noted. IMPRESSION: No definite evidence of bowel obstruction or ileus. Electronically Signed   By: Marijo Conception, M.D.   On: 12/10/2018 10:55        Scheduled Meds: . amLODipine  5 mg Oral Daily  . feeding supplement  1 Container Oral TID BM  . heparin  5,000 Units Subcutaneous Q8H  . losartan  100 mg Oral Daily   . methylPREDNISolone (SOLU-MEDROL) injection  40 mg Intravenous Q12H  . metoprolol succinate  25 mg Oral Daily  . venlafaxine  150 mg Oral BID WC  . venlafaxine  75 mg Oral QHS   Continuous Infusions: . 0.9 % NaCl with KCl 20 mEq / L 75 mL/hr at 12/10/18 1719     LOS: 3 days    Time spent: 25 minutes    Dana Allan, MD  [facility] Pager #: [phone] 7PM-7AM contact night coverage as above

## 2018-12-11 DIAGNOSIS — K5000 Crohn's disease of small intestine without complications: Secondary | ICD-10-CM

## 2018-12-11 LAB — RENAL FUNCTION PANEL
Albumin: 3.4 g/dL — ABNORMAL LOW (ref 3.5–5.0)
Anion gap: 8 (ref 5–15)
BUN: 18 mg/dL (ref 8–23)
CO2: 13 mmol/L — ABNORMAL LOW (ref 22–32)
Calcium: 8.5 mg/dL — ABNORMAL LOW (ref 8.9–10.3)
Chloride: 119 mmol/L — ABNORMAL HIGH (ref 98–111)
Creatinine, Ser: 1.18 mg/dL — ABNORMAL HIGH (ref 0.44–1.00)
GFR calc Af Amer: 54 mL/min — ABNORMAL LOW (ref >60)
GFR calc non Af Amer: 47 mL/min — ABNORMAL LOW (ref >60)
Glucose, Bld: 141 mg/dL — ABNORMAL HIGH (ref 70–99)
Phosphorus: 3 mg/dL (ref 2.5–4.6)
Potassium: 4 mmol/L (ref 3.5–5.1)
Sodium: 140 mmol/L (ref 135–145)

## 2018-12-11 LAB — MAGNESIUM: Magnesium: 2.1 mg/dL (ref 1.7–2.4)

## 2018-12-11 LAB — CBC WITH DIFFERENTIAL/PLATELET
Abs Immature Granulocytes: 0.19 10*3/uL — ABNORMAL HIGH (ref 0.00–0.07)
Basophils Absolute: 0.1 10*3/uL (ref 0.0–0.1)
Basophils Relative: 0 %
Eosinophils Absolute: 0 10*3/uL (ref 0.0–0.5)
Eosinophils Relative: 0 %
HCT: 42 % (ref 36.0–46.0)
Hemoglobin: 12.8 g/dL (ref 12.0–15.0)
Immature Granulocytes: 2 %
Lymphocytes Relative: 11 %
Lymphs Abs: 1.4 10*3/uL (ref 0.7–4.0)
MCH: 29.4 pg (ref 26.0–34.0)
MCHC: 30.5 g/dL (ref 30.0–36.0)
MCV: 96.3 fL (ref 80.0–100.0)
Monocytes Absolute: 1 10*3/uL (ref 0.1–1.0)
Monocytes Relative: 8 %
Neutro Abs: 10 10*3/uL — ABNORMAL HIGH (ref 1.7–7.7)
Neutrophils Relative %: 79 %
Platelets: 199 10*3/uL (ref 150–400)
RBC: 4.36 MIL/uL (ref 3.87–5.11)
RDW: 13.3 % (ref 11.5–15.5)
WBC: 12.6 10*3/uL — ABNORMAL HIGH (ref 4.0–10.5)
nRBC: 0 % (ref 0.0–0.2)

## 2018-12-11 MED ORDER — SODIUM CHLORIDE 0.9 % IV SOLN
INTRAVENOUS | Status: DC
  Administered 2018-12-11 – 2018-12-13 (×5): via INTRAVENOUS

## 2018-12-11 MED ORDER — HYDRALAZINE HCL 20 MG/ML IJ SOLN
5.0000 mg | Freq: Once | INTRAMUSCULAR | Status: AC
  Administered 2018-12-12: 5 mg via INTRAVENOUS
  Filled 2018-12-11: qty 1

## 2018-12-11 MED ORDER — SODIUM CHLORIDE 0.9 % IV BOLUS
500.0000 mL | Freq: Once | INTRAVENOUS | Status: AC
  Administered 2018-12-11: 500 mL via INTRAVENOUS

## 2018-12-11 MED ORDER — SODIUM CHLORIDE 0.9 % IV BOLUS: 500.0000 mL | Freq: Once | INTRAVENOUS | Status: DC

## 2018-12-11 NOTE — Progress Notes (Signed)
Subjective: The patient was seen and examined at bedside. She is now tolerating soft diet since breakfast. Reports having 3 loose bowel movements today. Denies nausea, vomiting or abdominal pain.  Objective: Vital signs in last 24 hours: Temp:  [97.7 F (36.5 C)-97.8 F (36.6 C)] 97.7 F (36.5 C) (12/31 0534) Pulse Rate:  [67-80] 67 (12/31 0534) Resp:  [14-17] 14 (12/31 0534) BP: (128-158)/(74-81) 128/74 (12/31 0534) SpO2:  [98 %-100 %] 98 % (12/31 0534) Weight change:  Last BM Date: 12/11/18  PE: No pallor, no icterus GENERAL: Appears comfortable ABDOMEN: Nondistended, soft, nontender, normoactive bowel sounds EXTREMITIES: No deformity  Lab Results: Results for orders placed or performed during the hospital encounter of 12/07/18 (from the past 48 hour(s))  CBC with Differential/Platelet     Status: Abnormal   Collection Time: 12/11/18  4:25 AM  Result Value Ref Range   WBC 12.6 (H) 4.0 - 10.5 K/uL   RBC 4.36 3.87 - 5.11 MIL/uL   Hemoglobin 12.8 12.0 - 15.0 g/dL   HCT 42.0 36.0 - 46.0 %   MCV 96.3 80.0 - 100.0 fL   MCH 29.4 26.0 - 34.0 pg   MCHC 30.5 30.0 - 36.0 g/dL   RDW 13.3 11.5 - 15.5 %   Platelets 199 150 - 400 K/uL   nRBC 0.0 0.0 - 0.2 %   Neutrophils Relative % 79 %   Neutro Abs 10.0 (H) 1.7 - 7.7 K/uL   Lymphocytes Relative 11 %   Lymphs Abs 1.4 0.7 - 4.0 K/uL   Monocytes Relative 8 %   Monocytes Absolute 1.0 0.1 - 1.0 K/uL   Eosinophils Relative 0 %   Eosinophils Absolute 0.0 0.0 - 0.5 K/uL   Basophils Relative 0 %   Basophils Absolute 0.1 0.0 - 0.1 K/uL   Immature Granulocytes 2 %   Abs Immature Granulocytes 0.19 (H) 0.00 - 0.07 K/uL    Comment: Performed at [facility], [place] [address], [place], [place] [address]  Renal function panel     Status: Abnormal   Collection Time: 12/11/18  4:25 AM  Result Value Ref Range   Sodium 140 135 - 145 mmol/L   Potassium 4.0 3.5 - 5.1 mmol/L   Chloride 119 (H) 98 - 111 mmol/L   CO2 13 (L) 22 -  32 mmol/L   Glucose, Bld 141 (H) 70 - 99 mg/dL   BUN 18 8 - 23 mg/dL   Creatinine, Ser 1.18 (H) 0.44 - 1.00 mg/dL   Calcium 8.5 (L) 8.9 - 10.3 mg/dL   Phosphorus 3.0 2.5 - 4.6 mg/dL   Albumin 3.4 (L) 3.5 - 5.0 g/dL   GFR calc non Af Amer 47 (L) >60 mL/min   GFR calc Af Amer 54 (L) >60 mL/min   Anion gap 8 5 - 15    Comment: Performed at [facility], [place] [address] [place], [place] [address]  Magnesium     Status: None   Collection Time: 12/11/18  4:25 AM  Result Value Ref Range   Magnesium 2.1 1.7 - 2.4 mg/dL    Comment: Performed at [facility], [place] [address], [place], [place] [address]    Studies/Results: Dg Abd 1 View  Result Date: 12/10/2018 CLINICAL DATA:  Abdominal distension, small bowel obstruction. EXAM: ABDOMEN - 1 VIEW COMPARISON:  Radiographs of February 03, 2017. CT scan of December 07, 2018. FINDINGS: Status post cholecystectomy. No colonic dilatation is noted. Surgical sutures are noted in the abdomen. No  significant small bowel dilatation is noted. IMPRESSION: No definite evidence of bowel obstruction or ileus. Electronically Signed   By: Marijo Conception, M.D.   On: 12/10/2018 10:55    Medications: I have reviewed the patient's current medications.  Assessment: Partial small bowel obstruction likely from underlying Crohn's Abdominal x-ray from yesterday showed no evidence of bowel obstruction or ileus Tolerating soft diet  Plan: If patient is able to tolerate soft diet for dinner, recommend discharge in a.m. Patient needs tapering dose of steroid, prednisone 40 mg daily for 1 week, then 30 mg daily for 1 week, and 20 mg daily for 1 week, 10 mg daily for 1 week, then discontinue. Outpatient therapy for Crohn's disease needs to be discussed during office visit-biologics/immunosuppressants. GI will sign off, patient advised to follow-up with equal GI in 2 weeks after discharge.   Ronnette Juniper 12/11/2018, 12:53 PM   Pager  [phone] If no answer or after 5 PM call [phone]

## 2018-12-11 NOTE — Progress Notes (Signed)
Discussed with pt what her status of DNR meant before applying DNR band and patient was not aware of her DNR status, pts will is not available currently, but pt does not want to continue her DNR status, informed patient that she needs to have her information updated to full code from DNR and provide copy of documentation. Armband was not applied

## 2018-12-11 NOTE — Progress Notes (Signed)
PROGRESS NOTE    Alexis Ramos  RFF:[id number] DOB: [birth year] DOA: 12/07/2018 PCP: [name], MD    Brief Narrative: 67 year old with past medical history significant for Crohn's disease with prior bowel resection, history of partial SBO, hypertension, chronic kidney disease a stage III who presents with 1 day history of nausea and abdominal pain.  She also reported worsening diarrhea.  CT abdomen and pelvis showed thickening of the terminal ileum consistent with Crohn's disease.  I was consulted and patient was a started on IV Solu-Medrol 40 mg twice daily. Patient doing better today, her diet was advanced today to soft diet. She noticed to have worsening metabolic acidosis today.   Assessment & Plan:   Principal Problem:   Crohn's disease involving terminal ileum ([place]) Active Problems:   Hypertension   Chronic kidney disease, stage III (moderate) ([place])   Depression    Crohn's disease involving terminal ileum with partial SBO: -Continue IV Solu-Medrol 40 mg every 12 hours -GI has been helping with patient care.  -patient diet was advanced today to soft diet.   Hypertension: -Continue home amlodipine,  Toprol-XL Hold losartan  Metabolic acidosis; Bicarb today at 16. Increase IV fluids, IV bolus.   CKD stage III: Renal function at baseline. -Continue to monitor  Depression: -Continue home venlafaxine        Nutrition Problem: Inadequate oral intake Etiology: acute illness(partial SBO, abdominal distention)    Signs/Symptoms: (chart review)    Interventions: Refer to RD note for recommendations  Estimated body mass index is 23.63 kg/m as calculated from the following:   Height as of this encounter: 4' 11"  (1.499 m).   Weight as of this encounter: 53.1 kg.   DVT prophylaxis: SCDs Code Status: Patient wishes to be a full code Family Communication: Care discussed with patient Disposition Plan: Home hopefully in 24 hours his bicarb  improved   Consultants:   GI   Procedures:   None   Antimicrobials:   None   Subjective: Is abdominal pain she has had 3 watery bowel movement  Objective: Vitals:   12/10/18 1405 12/10/18 2132 12/11/18 0534 12/11/18 1407  BP: (!) 158/81 (!) 145/76 128/74 132/76  Pulse: 80 72 67 71  Resp: 17 16 14 17   Temp:  97.8 F (36.6 C) 97.7 F (36.5 C) 98.2 F (36.8 C)  TempSrc:  Oral Oral Oral  SpO2: 99% 100% 98% 99%  Weight:      Height:        Intake/Output Summary (Last 24 hours) at 12/11/2018 1713 Last data filed at 12/11/2018 1408 Gross per 24 hour  Intake 3004.01 ml  Output 75 ml  Net 2929.01 ml   Filed Weights   12/07/18 1623 12/07/18 2133  Weight: 53.1 kg 53.1 kg    Examination:  General exam: Appears calm and comfortable  Respiratory system: Clear to auscultation. Respiratory effort normal. Cardiovascular system: S1 & S2 heard, RRR. No JVD, murmurs, rubs, gallops or clicks. No pedal edema. Gastrointestinal system: Abdomen is soft, old incision scar. nt  Central nervous system: Alert and oriented. No focal neurological deficits. Extremities: Symmetric 5 x 5 power. Skin: No rashes, lesions or ulcers Psychiatry: Judgement and insight appear normal. Mood & affect appropriate.     Data Reviewed: I have personally reviewed following labs and imaging studies  CBC: Recent Labs  Lab 12/07/18 1624 12/08/18 0432 12/11/18 0425  WBC 9.8 8.8 12.6*  NEUTROABS  --   --  10.0*  HGB 15.3* 12.6 12.8  HCT  48.8* 40.9 42.0  MCV 94.9 95.3 96.3  PLT 229 180 921   Basic Metabolic Panel: Recent Labs  Lab 12/07/18 1624 12/08/18 0432 12/11/18 0425  NA 137 138 140  K 3.6 3.8 4.0  CL 99 107 119*  CO2 23 20* 13*  GLUCOSE 179* 157* 141*  BUN 21 19 18   CREATININE 1.31* 1.10* 1.18*  CALCIUM 9.3 8.3* 8.5*  MG  --   --  2.1  PHOS  --   --  3.0   GFR: Estimated Creatinine Clearance: 33.5 mL/min (A) (by C-G formula based on SCr of 1.18 mg/dL (H)). Liver  Function Tests: Recent Labs  Lab 12/07/18 1624 12/11/18 0425  AST 18  --   ALT 22  --   ALKPHOS 62  --   BILITOT 1.2  --   PROT 8.2*  --   ALBUMIN 4.3 3.4*   Recent Labs  Lab 12/07/18 1624  LIPASE 27   No results for input(s): AMMONIA in the last 168 hours. Coagulation Profile: No results for input(s): INR, PROTIME in the last 168 hours. Cardiac Enzymes: No results for input(s): CKTOTAL, CKMB, CKMBINDEX, TROPONINI in the last 168 hours. BNP (last 3 results) No results for input(s): PROBNP in the last 8760 hours. HbA1C: No results for input(s): HGBA1C in the last 72 hours. CBG: Recent Labs  Lab 12/08/18 1943 12/09/18 0923  GLUCAP 168* 80   Lipid Profile: No results for input(s): CHOL, HDL, LDLCALC, TRIG, CHOLHDL, LDLDIRECT in the last 72 hours. Thyroid Function Tests: No results for input(s): TSH, T4TOTAL, FREET4, T3FREE, THYROIDAB in the last 72 hours. Anemia Panel: No results for input(s): VITAMINB12, FOLATE, FERRITIN, TIBC, IRON, RETICCTPCT in the last 72 hours. Sepsis Labs: No results for input(s): PROCALCITON, LATICACIDVEN in the last 168 hours.  No results found for this or any previous visit (from the past 240 hour(s)).       Radiology Studies: Dg Abd 1 View  Result Date: 12/10/2018 CLINICAL DATA:  Abdominal distension, small bowel obstruction. EXAM: ABDOMEN - 1 VIEW COMPARISON:  Radiographs of February 03, 2017. CT scan of December 07, 2018. FINDINGS: Status post cholecystectomy. No colonic dilatation is noted. Surgical sutures are noted in the abdomen. No significant small bowel dilatation is noted. IMPRESSION: No definite evidence of bowel obstruction or ileus. Electronically Signed   By: Marijo Conception, M.D.   On: 12/10/2018 10:55        Scheduled Meds: . amLODipine  5 mg Oral Daily  . feeding supplement  1 Container Oral TID BM  . heparin  5,000 Units Subcutaneous Q8H  . methylPREDNISolone (SOLU-MEDROL) injection  40 mg Intravenous Q12H  .  metoprolol succinate  25 mg Oral Daily  . venlafaxine  150 mg Oral BID WC  . venlafaxine  75 mg Oral QHS   Continuous Infusions: . sodium chloride 100 mL/hr at 12/11/18 1530     LOS: 4 days    Time spent: 35 minutes.     Elmarie Shiley, MD [facility] Pager [phone]  If 7PM-7AM, please contact night-coverage [email or web address] Password [email or web address] 12/11/2018, 5:13 PM

## 2018-12-12 LAB — BASIC METABOLIC PANEL
Anion gap: 11 (ref 5–15)
BUN: 15 mg/dL (ref 8–23)
CALCIUM: 8.5 mg/dL — AB (ref 8.9–10.3)
CO2: 16 mmol/L — ABNORMAL LOW (ref 22–32)
Chloride: 116 mmol/L — ABNORMAL HIGH (ref 98–111)
Creatinine, Ser: 1.07 mg/dL — ABNORMAL HIGH (ref 0.44–1.00)
GFR calc Af Amer: >60 mL/min (ref >60)
GFR calc non Af Amer: 53 mL/min — ABNORMAL LOW (ref >60)
Glucose, Bld: 117 mg/dL — ABNORMAL HIGH (ref 70–99)
Potassium: 3 mmol/L — ABNORMAL LOW (ref 3.5–5.1)
Sodium: 143 mmol/L (ref 135–145)

## 2018-12-12 MED ORDER — POTASSIUM CHLORIDE CRYS ER 20 MEQ PO TBCR
40.0000 meq | EXTENDED_RELEASE_TABLET | ORAL | Status: AC
  Administered 2018-12-12 (×2): 40 meq via ORAL
  Filled 2018-12-12 (×2): qty 2

## 2018-12-12 MED ORDER — SODIUM BICARBONATE 650 MG PO TABS
1300.0000 mg | ORAL_TABLET | Freq: Two times a day (BID) | ORAL | Status: DC
  Administered 2018-12-12 – 2018-12-13 (×3): 1300 mg via ORAL
  Filled 2018-12-12 (×3): qty 2

## 2018-12-12 MED ORDER — SODIUM CHLORIDE 0.9 % IV BOLUS
1000.0000 mL | Freq: Once | INTRAVENOUS | Status: AC
  Administered 2018-12-12: 1000 mL via INTRAVENOUS

## 2018-12-12 NOTE — Progress Notes (Signed)
PROGRESS NOTE    Alexis Ramos  BJS:[id number] DOB: 05/10/1951 DOA: 12/07/2018 PCP: [name], MD    Brief Narrative: 68 year old with past medical history significant for Crohn's disease with prior bowel resection, history of partial SBO, hypertension, chronic kidney disease a stage III who presents with 1 day history of nausea and abdominal pain.  She also reported worsening diarrhea.  CT abdomen and pelvis showed thickening of the terminal ileum consistent with Crohn's disease.  I was consulted and patient was a started on IV Solu-Medrol 40 mg twice daily. Patient doing better today, her diet was advanced today to soft diet. She noticed to have worsening metabolic acidosis today.   Assessment & Plan:   Principal Problem:   Crohn's disease involving terminal ileum ([place]) Active Problems:   Hypertension   Chronic kidney disease, stage III (moderate) ([place])   Depression    Crohn's disease involving terminal ileum with partial SBO: -Continue IV Solu-Medrol 40 mg every 12 hours -GI has been helping with patient care.  -patient diet was advanced today to soft diet.  Tolerating diet, diarrhea has improved today has had 1 bowel movement today.  She had 8 watery bowel movement yesterday.  Hypertension: -Continue home amlodipine,  Toprol-XL Hold losartan  Metabolic acidosis; Continue to have metabolic acidosis bicarb has increased to 16. We will continue with IV fluids for another 24 hours.  Repeat labs in the morning.  CKD stage III: Renal function at baseline. -Continue to monitor  Depression: -Continue home venlafaxine      Nutrition Problem: Inadequate oral intake Etiology: acute illness(partial SBO, abdominal distention)    Signs/Symptoms: (chart review)    Interventions: Refer to RD note for recommendations  Estimated body mass index is 23.63 kg/m as calculated from the following:   Height as of this encounter: 4' 11"  (1.499 m).   Weight as  of this encounter: 53.1 kg.   DVT prophylaxis: SCDs Code Status: Patient wishes to be a full code Family Communication: Care discussed with patient Disposition Plan: Home hopefully in 24 hours his bicarb improved   Consultants:   GI   Procedures:   None   Antimicrobials:   None   Subjective: She reports diarrhea has improved.  Tolerating diet.  Denies worsening abdominal pain.  Objective: Vitals:   12/11/18 2226 12/12/18 0138 12/12/18 0502 12/12/18 1330  BP: (!) 173/78 (!) 160/81 (!) 144/71 (!) 155/80  Pulse: 66 70 70 73  Resp: 16 16 16 17   Temp: 97.6 F (36.4 C) 97.8 F (36.6 C) 97.8 F (36.6 C) 97.7 F (36.5 C)  TempSrc: Oral Oral Oral Oral  SpO2: 99% 100% 98% 100%  Weight:      Height:        Intake/Output Summary (Last 24 hours) at 12/12/2018 1701 Last data filed at 12/12/2018 1411 Gross per 24 hour  Intake 4184.26 ml  Output 1525 ml  Net 2659.26 ml   Filed Weights   12/07/18 1623 12/07/18 2133  Weight: 53.1 kg 53.1 kg    Examination:  General exam: No acute distress Respiratory system: clear  to auscultation Cardiovascular system: S1-S2 regular rhythm and rate Gastrointestinal system: BS present, soft, deform abdomen from prior sx  Central nervous system: Alert and oriented Extremities: Metric power  Data Reviewed: I have personally reviewed following labs and imaging studies  CBC: Recent Labs  Lab 12/07/18 1624 12/08/18 0432 12/11/18 0425  WBC 9.8 8.8 12.6*  NEUTROABS  --   --  10.0*  HGB 15.3* 12.6  12.8  HCT 48.8* 40.9 42.0  MCV 94.9 95.3 96.3  PLT 229 180 213   Basic Metabolic Panel: Recent Labs  Lab 12/07/18 1624 12/08/18 0432 12/11/18 0425 12/12/18 0523  NA 137 138 140 143  K 3.6 3.8 4.0 3.0*  CL 99 107 119* 116*  CO2 23 20* 13* 16*  GLUCOSE 179* 157* 141* 117*  BUN 21 19 18 15   CREATININE 1.31* 1.10* 1.18* 1.07*  CALCIUM 9.3 8.3* 8.5* 8.5*  MG  --   --  2.1  --   PHOS  --   --  3.0  --    GFR: Estimated  Creatinine Clearance: 37 mL/min (A) (by C-G formula based on SCr of 1.07 mg/dL (H)). Liver Function Tests: Recent Labs  Lab 12/07/18 1624 12/11/18 0425  AST 18  --   ALT 22  --   ALKPHOS 62  --   BILITOT 1.2  --   PROT 8.2*  --   ALBUMIN 4.3 3.4*   Recent Labs  Lab 12/07/18 1624  LIPASE 27   No results for input(s): AMMONIA in the last 168 hours. Coagulation Profile: No results for input(s): INR, PROTIME in the last 168 hours. Cardiac Enzymes: No results for input(s): CKTOTAL, CKMB, CKMBINDEX, TROPONINI in the last 168 hours. BNP (last 3 results) No results for input(s): PROBNP in the last 8760 hours. HbA1C: No results for input(s): HGBA1C in the last 72 hours. CBG: Recent Labs  Lab 12/08/18 1943 12/09/18 0923  GLUCAP 168* 80   Lipid Profile: No results for input(s): CHOL, HDL, LDLCALC, TRIG, CHOLHDL, LDLDIRECT in the last 72 hours. Thyroid Function Tests: No results for input(s): TSH, T4TOTAL, FREET4, T3FREE, THYROIDAB in the last 72 hours. Anemia Panel: No results for input(s): VITAMINB12, FOLATE, FERRITIN, TIBC, IRON, RETICCTPCT in the last 72 hours. Sepsis Labs: No results for input(s): PROCALCITON, LATICACIDVEN in the last 168 hours.  No results found for this or any previous visit (from the past 240 hour(s)).       Radiology Studies: No results found.      Scheduled Meds: . amLODipine  5 mg Oral Daily  . feeding supplement  1 Container Oral TID BM  . heparin  5,000 Units Subcutaneous Q8H  . methylPREDNISolone (SOLU-MEDROL) injection  40 mg Intravenous Q12H  . metoprolol succinate  25 mg Oral Daily  . sodium bicarbonate  1,300 mg Oral BID  . venlafaxine  150 mg Oral BID WC  . venlafaxine  75 mg Oral QHS   Continuous Infusions: . sodium chloride 100 mL/hr at 12/12/18 1411  . sodium chloride       LOS: 5 days    Time spent: 35 minutes.     Elmarie Shiley, MD [facility] Pager [phone]  If 7PM-7AM, please contact  night-coverage [email or web address] Password [email or web address] 12/12/2018, 5:01 PM

## 2018-12-13 LAB — BASIC METABOLIC PANEL
Anion gap: 11 (ref 5–15)
BUN: 13 mg/dL (ref 8–23)
CO2: 20 mmol/L — ABNORMAL LOW (ref 22–32)
Calcium: 8.4 mg/dL — ABNORMAL LOW (ref 8.9–10.3)
Chloride: 111 mmol/L (ref 98–111)
Creatinine, Ser: 0.95 mg/dL (ref 0.44–1.00)
GFR calc Af Amer: >60 mL/min (ref >60)
GFR calc non Af Amer: >60 mL/min (ref >60)
Glucose, Bld: 140 mg/dL — ABNORMAL HIGH (ref 70–99)
Potassium: 3.5 mmol/L (ref 3.5–5.1)
Sodium: 142 mmol/L (ref 135–145)

## 2018-12-13 MED ORDER — PREDNISONE 10 MG PO TABS: ORAL_TABLET | ORAL | 0 refills | Status: DC

## 2018-12-13 NOTE — Discharge Summary (Signed)
Physician Discharge Summary  Alexis Ramos:[id number] DOB: August 12, 1951 DOA: 12/07/2018  PCP: [name], MD  Admit date: 12/07/2018 Discharge date: 12/13/2018  Admitted From: Home  Disposition:  Home   Recommendations for Outpatient Follow-up:  1. Follow up with PCP in 1-2 weeks 2. Please obtain BMP/CBC in one week 3. Follow with GI for further care of crohn diseases.     Discharge Condition: Stable.  CODE STATUS: full code.  Diet recommendation: soft diet.   Brief/Interim Summary: 68 year old with past medical history significant for Crohn's disease with prior bowel resection, history of partial SBO, hypertension, chronic kidney disease a stage III who presents with 1 day history of nausea and abdominal pain.  She also reported worsening diarrhea.  CT abdomen and pelvis showed thickening of the terminal ileum consistent with Crohn's disease.  I was consulted and patient was a started on IV Solu-Medrol 40 mg twice daily.  Develops metabolic acidosis likely related to diarrhea, after IV fluids acidosis has resolved.   Crohn's disease involving terminal ileum with partial SBO: -Continue IV Solu-Medrol 40 mg every 12 hours -GI has been helping with patient care.  -patient diet was advanced  to soft diet.  -diarrhea improved, tolerating diet.  Stable for discharge today   Hypertension: -Continue home amlodipine,  Toprol-XL Resume/ losartan at discharge  Metabolic acidosis; Continue to have metabolic acidosis bicarb has increased to 16. Improved , bicarb at 20.  Stable for discharge.   CKD stage III: Renal function at baseline. -Continue to monitor  Depression: -Continue home venlafaxine   Discharge Diagnoses:  Principal Problem:   Crohn's disease involving terminal ileum ([place]) Active Problems:   Hypertension   Chronic kidney disease, stage III (moderate) ([place])   Depression    Discharge Instructions  Discharge Instructions    Diet - low sodium  heart healthy   Complete by:  As directed    Increase activity slowly   Complete by:  As directed      Allergies as of 12/13/2018      Reactions   Penicillins Anaphylaxis   Pt grandmother died from it and her brother had anaphylaxis so she was told never to take.  Has patient had a PCN reaction causing immediate rash, facial/tongue/throat swelling, SOB or lightheadedness with hypotension: No Has patient had a PCN reaction causing severe rash involving mucus membranes or skin necrosis: No Has patient had a PCN reaction that required hospitalization No Has patient had a PCN reaction occurring within the last 10 years: No If all of the above answers are "NO", then may pr   Advil [ibuprofen]    Bleeding.    Asa [aspirin]    Hemorrhage.      Daypro [oxaprozin]    Bleeding.   Nsaids Other (See Comments)   H/o Crohn's disease      Medication List    TAKE these medications   amLODipine 5 MG tablet Commonly known as:  NORVASC Take 5 mg by mouth daily.   losartan 100 MG tablet Commonly known as:  COZAAR Take 100 mg by mouth daily.   MEGARED OMEGA-3 KRILL OIL PO Take 1 tablet by mouth daily.   metoprolol succinate 25 MG 24 hr tablet Commonly known as:  TOPROL-XL Take 25 mg by mouth daily.   predniSONE 10 MG tablet Commonly known as:  DELTASONE Take 4 tablets for one week then 3 tablets for one week then 2 tablets for one week then 1 tablet for one week then stop.  venlafaxine 75 MG tablet Commonly known as:  EFFEXOR Take 75-150 mg by mouth See admin instructions. Takes 134m in the morning and at lunch time and 768mat bedtime   VITAMIN B-12 IJ Inject as directed every 21 ( twenty-one) days.   Vitamin D-3 125 MCG (5000 UT) Tabs Take 5,000 Units by mouth daily.   Vitamin E 400 units Tabs Take 800 Units by mouth daily.       Allergies  Allergen Reactions  . Penicillins Anaphylaxis    Pt grandmother died from it and her brother had anaphylaxis so she was told never  to take.  Has patient had a PCN reaction causing immediate rash, facial/tongue/throat swelling, SOB or lightheadedness with hypotension: No Has patient had a PCN reaction causing severe rash involving mucus membranes or skin necrosis: No Has patient had a PCN reaction that required hospitalization No Has patient had a PCN reaction occurring within the last 10 years: No If all of the above answers are "NO", then may pr  . Advil [Ibuprofen]     Bleeding.   . Diona FantiAspirin]     Hemorrhage.     . Lanae CrumblyOxaprozin]     Bleeding.  . Nsaids Other (See Comments)    H/o Crohn's disease    Consultations: GI  Procedures/Studies: Dg Abd 1 View  Result Date: 12/10/2018 CLINICAL DATA:  Abdominal distension, small bowel obstruction. EXAM: ABDOMEN - 1 VIEW COMPARISON:  Radiographs of February 03, 2017. CT scan of December 07, 2018. FINDINGS: Status post cholecystectomy. No colonic dilatation is noted. Surgical sutures are noted in the abdomen. No significant small bowel dilatation is noted. IMPRESSION: No definite evidence of bowel obstruction or ileus. Electronically Signed   By: JaMarijo ConceptionM.D.   On: 12/10/2018 10:55   Ct Abdomen Pelvis W Contrast  Result Date: 12/07/2018 CLINICAL DATA:  Diffuse abdominal pain.  History of Crohn's disease. EXAM: CT ABDOMEN AND PELVIS WITH CONTRAST TECHNIQUE: Multidetector CT imaging of the abdomen and pelvis was performed using the standard protocol following bolus administration of intravenous contrast. CONTRAST:  8061mSOVUE-300 IOPAMIDOL (ISOVUE-300) INJECTION 61% COMPARISON:  CT scan June 11, 2016. FINDINGS: Lower chest: No acute abnormality. Hepatobiliary: Status post cholecystectomy. No biliary dilatation is noted. Probable fatty infiltration of the liver. Pancreas: Unremarkable. No pancreatic ductal dilatation or surrounding inflammatory changes. Spleen: Normal in size without focal abnormality. Adrenals/Urinary Tract: Adrenal glands appear normal.  Nonobstructive left nephrolithiasis is noted. Bilateral renal cysts are noted. No hydronephrosis or renal obstruction is noted. Urinary bladder is unremarkable. Stomach/Bowel: The stomach appears normal. Status post appendectomy. Colon is nondilated. There is seen wall thickening involving the terminal ileum consistent with history of Crohn's disease, although acute inflammation is not visualized. There is noted mild dilatation of distal small bowel loops. Vascular/Lymphatic: Aortic atherosclerosis. No enlarged abdominal or pelvic lymph nodes. Reproductive: Uterus and bilateral adnexa are unremarkable. Other: No abdominal wall hernia or abnormality. No abdominopelvic ascites. Musculoskeletal: No acute or significant osseous findings. IMPRESSION: Wall thickening of terminal ileum is noted consistent with Crohn's disease, although no definite inflammatory changes are noted to suggest acute inflammation. There is noted mild distal small bowel dilatation suggesting partial obstruction. Probable fatty infiltration of the liver. Nonobstructive left nephrolithiasis. Aortic Atherosclerosis (ICD10-I70.0). Electronically Signed   By: JamMarijo Conception.D.   On: 12/07/2018 19:01      Subjective: Feeling better , denies abdominal pain.   Discharge Exam: Vitals:   12/12/18 2234 12/13/18 0539  BP: (!) 162/81 (!)Marland Kitchen  154/82  Pulse: 70 62  Resp:  16  Temp:  97.6 F (36.4 C)  SpO2:  97%   Vitals:   12/12/18 1330 12/12/18 2232 12/12/18 2234 12/13/18 0539  BP: (!) 155/80 (!) 171/90 (!) 162/81 (!) 154/82  Pulse: 73 71 70 62  Resp: 17 16  16   Temp: 97.7 F (36.5 C) 98.2 F (36.8 C)  97.6 F (36.4 C)  TempSrc: Oral Oral  Oral  SpO2: 100% 100%  97%  Weight:      Height:        General: Pt is alert, awake, not in acute distress Cardiovascular: RRR, S1/S2 +, no rubs, no gallops Respiratory: CTA bilaterally, no wheezing, no rhonchi Abdominal: Soft, NT, ND, bowel sounds + Extremities: no edema, no  cyanosis    The results of significant diagnostics from this hospitalization (including imaging, microbiology, ancillary and laboratory) are listed below for reference.     Microbiology: No results found for this or any previous visit (from the past 240 hour(s)).   Labs: BNP (last 3 results) No results for input(s): BNP in the last 8760 hours. Basic Metabolic Panel: Recent Labs  Lab 12/07/18 1624 12/08/18 0432 12/11/18 0425 12/12/18 0523 12/13/18 0534  NA 137 138 140 143 142  K 3.6 3.8 4.0 3.0* 3.5  CL 99 107 119* 116* 111  CO2 23 20* 13* 16* 20*  GLUCOSE 179* 157* 141* 117* 140*  BUN 21 19 18 15 13   CREATININE 1.31* 1.10* 1.18* 1.07* 0.95  CALCIUM 9.3 8.3* 8.5* 8.5* 8.4*  MG  --   --  2.1  --   --   PHOS  --   --  3.0  --   --    Liver Function Tests: Recent Labs  Lab 12/07/18 1624 12/11/18 0425  AST 18  --   ALT 22  --   ALKPHOS 62  --   BILITOT 1.2  --   PROT 8.2*  --   ALBUMIN 4.3 3.4*   Recent Labs  Lab 12/07/18 1624  LIPASE 27   No results for input(s): AMMONIA in the last 168 hours. CBC: Recent Labs  Lab 12/07/18 1624 12/08/18 0432 12/11/18 0425  WBC 9.8 8.8 12.6*  NEUTROABS  --   --  10.0*  HGB 15.3* 12.6 12.8  HCT 48.8* 40.9 42.0  MCV 94.9 95.3 96.3  PLT 229 180 199   Cardiac Enzymes: No results for input(s): CKTOTAL, CKMB, CKMBINDEX, TROPONINI in the last 168 hours. BNP: Invalid input(s): POCBNP CBG: Recent Labs  Lab 12/08/18 1943 12/09/18 0923  GLUCAP 168* 80   D-Dimer No results for input(s): DDIMER in the last 72 hours. Hgb A1c No results for input(s): HGBA1C in the last 72 hours. Lipid Profile No results for input(s): CHOL, HDL, LDLCALC, TRIG, CHOLHDL, LDLDIRECT in the last 72 hours. Thyroid function studies No results for input(s): TSH, T4TOTAL, T3FREE, THYROIDAB in the last 72 hours.  Invalid input(s): FREET3 Anemia work up No results for input(s): VITAMINB12, FOLATE, FERRITIN, TIBC, IRON, RETICCTPCT in the last 72  hours. Urinalysis    Component Value Date/Time   COLORURINE AMBER (A) 06/11/2016 1901   APPEARANCEUR CLEAR 06/11/2016 1901   LABSPEC 1.022 06/11/2016 1901   PHURINE 5.5 06/11/2016 1901   GLUCOSEU NEGATIVE 06/11/2016 1901   HGBUR NEGATIVE 06/11/2016 1901   BILIRUBINUR NEGATIVE 06/11/2016 1901   KETONESUR NEGATIVE 06/11/2016 1901   PROTEINUR 30 (A) 06/11/2016 1901   UROBILINOGEN 0.2 05/16/2012 1226   NITRITE NEGATIVE 06/11/2016 1901  LEUKOCYTESUR NEGATIVE 06/11/2016 1901   Sepsis Labs Invalid input(s): PROCALCITONIN,  WBC,  LACTICIDVEN Microbiology No results found for this or any previous visit (from the past 240 hour(s)).   Time coordinating discharge: 35 minutes.,   SIGNED:   Elmarie Shiley, MD  [facility] 12/13/2018, 11:26 AM Pager   If 7PM-7AM, please contact night-coverage [email or web address] Password [email or web address]

## 2018-12-13 NOTE — Care Management Important Message (Signed)
Important Message  Patient Details  Name: Alexis Ramos MRN: [id number] Date of Birth: 1951/03/02   Medicare Important Message Given:  Yes    Kerin Salen 12/13/2018, 10:01 AMImportant Message  Patient Details  Name: Alexis Ramos MRN: [id number] Date of Birth: [birth year]   Medicare Important Message Given:  Yes    Kerin Salen 12/13/2018, 10:01 AM

## 2018-12-13 NOTE — Progress Notes (Signed)
Patient d/c to home. Discharge instructions explained. Patient had no question. Prescription given to patient. NT to push patient to main entrance

## 2019-01-08 ENCOUNTER — Ambulatory Visit
Admission: RE | Admit: 2019-01-08 | Discharge: 2019-01-08 | Disposition: A | Discharge status: Home / Self Care | Payer: Medicare Other | Source: Ambulatory Visit | Attending: Gastroenterology | Admitting: Gastroenterology

## 2019-01-08 ENCOUNTER — Other Ambulatory Visit: Payer: Self-pay | Admitting: Gastroenterology

## 2019-01-08 DIAGNOSIS — K50019 Crohn's disease of small intestine with unspecified complications: Secondary | ICD-10-CM

## 2019-01-08 DIAGNOSIS — R7612 Nonspecific reaction to cell mediated immunity measurement of gamma interferon antigen response without active tuberculosis: Secondary | ICD-10-CM

## 2019-05-01 ENCOUNTER — Other Ambulatory Visit: Payer: Self-pay | Admitting: Internal Medicine

## 2019-05-01 DIAGNOSIS — R4701 Aphasia: Secondary | ICD-10-CM

## 2019-05-01 DIAGNOSIS — R519 Headache, unspecified: Secondary | ICD-10-CM

## 2019-05-01 DIAGNOSIS — R29898 Other symptoms and signs involving the musculoskeletal system: Secondary | ICD-10-CM

## 2019-05-07 ENCOUNTER — Other Ambulatory Visit: Payer: Self-pay

## 2019-05-07 ENCOUNTER — Ambulatory Visit
Admission: RE | Admit: 2019-05-07 | Discharge: 2019-05-07 | Disposition: A | Discharge status: Home / Self Care | Payer: Medicare Other | Source: Ambulatory Visit | Attending: Internal Medicine | Admitting: Internal Medicine

## 2019-05-07 DIAGNOSIS — R4701 Aphasia: Secondary | ICD-10-CM

## 2019-05-07 DIAGNOSIS — R519 Headache, unspecified: Secondary | ICD-10-CM

## 2019-05-07 DIAGNOSIS — R29898 Other symptoms and signs involving the musculoskeletal system: Secondary | ICD-10-CM

## 2019-05-16 ENCOUNTER — Telehealth: Payer: Self-pay | Admitting: Internal Medicine

## 2019-05-16 NOTE — Telephone Encounter (Signed)
°  Due to current COVID 19 pandemic, our office is severely reducing in office visits until further notice, in order to minimize the risk to our patients and healthcare providers.    Called patient and scheduled a virtual visit with Dr. Leta Baptist for 6/22. Patient verbalized understanding of the [email or web address] process and I have sent an e-mail [email or web address] with link and instructions as well as my name and our office number. Patient understands that they will receive a call from RN to update chart.   Pt understands that although there may be some limitations with this type of visit, we will take all precautions to reduce any security or privacy concerns.  Pt understands that this will be treated like an in office visit and we will file with pt's insurance, and there may be a patient responsible charge related to this service.

## 2019-05-29 ENCOUNTER — Telehealth: Payer: Self-pay | Admitting: Diagnostic Neuroimaging

## 2019-05-29 ENCOUNTER — Encounter: Payer: Self-pay | Admitting: *Deleted

## 2019-05-29 NOTE — Telephone Encounter (Signed)
Due to current COVID 19 pandemic, our office is severely reducing in office visits until further notice, in order to minimize the risk to our patients and healthcare providers.  Calledpatient and scheduled a virtual visit with Dr. Leta Baptist for 6/22. Patient verbalized understanding of the [email or web address] process and I have sentan e-mail [email or web address] with link and instructions as well as my name and our office number. Patient understands that they will receive a call from RN to update chart.  Pt understands that although there may be some limitations with this type of visit, we will take all precautions to reduce any security or privacy concerns. Pt understands that this will be treated like an in office visit and we will file with pt's insurance, and there may be a patient responsible charge related to this service.

## 2019-05-29 NOTE — Addendum Note (Signed)
Addended by: Minna Antis on: 05/29/2019 02:43 PM   Modules accepted: Level of Service, SmartSet

## 2019-05-29 NOTE — Addendum Note (Signed)
Addended by: Minna Antis on: 05/29/2019 02:14 PM   Modules accepted: Orders

## 2019-05-29 NOTE — Telephone Encounter (Signed)
Spoke with patient and updated EMR. 

## 2019-05-29 NOTE — Telephone Encounter (Signed)
This encounter was created in error - please disregard.

## 2019-06-03 ENCOUNTER — Other Ambulatory Visit: Payer: Self-pay

## 2019-06-03 ENCOUNTER — Ambulatory Visit: Payer: Medicare Other | Admitting: Diagnostic Neuroimaging

## 2019-06-03 NOTE — Telephone Encounter (Signed)
Called patient because she was unable to connect for video visit today. She stated her phone would not allow it to open on Chrome, could not open [email or web address] on [place]. She has no other means to do video visit. She agreed to reschedule for July and come into office. She  verbalized understanding, appreciation.

## 2019-06-10 NOTE — Telephone Encounter (Signed)
Spoke with patient and advised her our office is still going to be closed on Fridays in July due to Covid 19, need to reschedule. We rescheduled for sooner. She understands to arrive 30 minutes early to check in, verbalized understanding, appreciation.

## 2019-06-19 ENCOUNTER — Encounter: Payer: Self-pay | Admitting: Diagnostic Neuroimaging

## 2019-06-19 ENCOUNTER — Ambulatory Visit: Payer: Medicare Other | Admitting: Diagnostic Neuroimaging

## 2019-06-19 ENCOUNTER — Other Ambulatory Visit: Payer: Self-pay

## 2019-06-19 VITALS — BP 135/79 | HR 91 | Temp 97.8°F | Ht 59.0 in | Wt 121.6 lb

## 2019-06-19 DIAGNOSIS — R29898 Other symptoms and signs involving the musculoskeletal system: Secondary | ICD-10-CM | POA: Diagnosis not present

## 2019-06-19 DIAGNOSIS — R4789 Other speech disturbances: Secondary | ICD-10-CM

## 2019-06-19 NOTE — Progress Notes (Signed)
[facility]  PATIENT: Alexis Ramos DOB: 1951-10-16  REFERRING CLINICIAN: Threasa Beards HISTORY FROM: patient  REASON FOR VISIT: new consult    HISTORICAL  CHIEF COMPLAINT:  Chief Complaint  Patient presents with  . Forgetfulness    rm 7 New Pt  MMSE 27 "word finding difficulty, falling, lack of coordination, tripping up steps"    HISTORY OF PRESENT ILLNESS:   68 year old female here for evaluation of word finding difficulties and left leg weakness.  March to April 2020 patient noticed increasing word finding difficulties.  This would happen randomly and in various situations.  Patient went to PCP and had MRI of the brain which was unremarkable for any acute findings.  Around the same time patient noticed some left leg weakness and stumbling.  This is slightly improved.  In the past 1-1/2 years patient has been under increased stress as she has become estranged from a very close friend who she considered family here in [place], after patient got into an argument with her friend's daughter.  This is caused a great stress to the patient.  In December 2019 patient was hospitalized for bowel obstruction.  This was treated with steroids and patient gradually recovered.  After her hospitalization she developed other complications including thrush.  Patient was not that active between January and March 2020.    REVIEW OF SYSTEMS: Full 14 system review of systems performed and negative with exception of: As per HPI.  ALLERGIES: Allergies  Allergen Reactions  . Penicillins Anaphylaxis    Pt grandmother died from it and her brother had anaphylaxis so she was told never to take.  Has patient had a PCN reaction causing immediate rash, facial/tongue/throat swelling, SOB or lightheadedness with hypotension: No Has patient had a PCN reaction causing severe rash involving mucus membranes or skin necrosis: No Has patient had a PCN reaction that required hospitalization  No Has patient had a PCN reaction occurring within the last 10 years: No If all of the above answers are "NO", then may pr  . Advil [Ibuprofen]     Bleeding.   Diona Fanti [Aspirin]     Hemorrhage.     Lanae Crumbly [Oxaprozin]     Bleeding.  . Nsaids Other (See Comments)    H/o Crohn's disease    HOME MEDICATIONS: Outpatient Medications Prior to Visit  Medication Sig Dispense Refill  . amLODipine (NORVASC) 5 MG tablet Take 5 mg by mouth daily.    . Cyanocobalamin (VITAMIN B-12 IJ) Inject as directed every 21 ( twenty-one) days.     Marland Kitchen losartan (COZAAR) 100 MG tablet 100 mg daily.    . metoprolol succinate (TOPROL-XL) 25 MG 24 hr tablet Take 25 mg by mouth daily.    Marland Kitchen omeprazole (PRILOSEC) 20 MG capsule 20 mg daily.    Marland Kitchen venlafaxine (EFFEXOR) 75 MG tablet Take 75-150 mg by mouth See admin instructions. Takes 150mg  in the morning and at lunch time and 75mg  at bedtime    . Cholecalciferol (VITAMIN D-3) 5000 units TABS Take 5,000 Units by mouth daily.    Marland Kitchen MEGARED OMEGA-3 KRILL OIL PO Take 1 tablet by mouth daily.    . Vitamin E 400 units TABS Take 800 Units by mouth daily.     No facility-administered medications prior to visit.     PAST MEDICAL HISTORY: Past Medical History:  Diagnosis Date  . Arthritis   . Cancer ([place])    melanoma on back   . Crohn's disease ([place])   .  Depression   . DES exposure in utero   . Diabetes ([place])    type 2  . Forgetfulness   . Hyperlipidemia   . Hypertension   . Kidney stones   . Menopausal symptoms   . SBO (small bowel obstruction) ([place]) 06/2011, 11/2018   tx w/steroids  . Snoring     PAST SURGICAL HISTORY: Past Surgical History:  Procedure Laterality Date  . Anal abscess surg    . anal sphincterectomy    . APPENDECTOMY  1970  . [place]   small, x 3  . BREAST BIOPSY Left   . BREAST SURGERY     Biopsy-Benign  . CERVICAL FUSION  05/2009   C4-C7  . CHOLECYSTECTOMY N/A 08/10/2016   Procedure: LAPAROSCOPIC  CHOLECYSTECTOMY WITH INTRAOPERATIVE CHOLANGIOGRAM -STANDARD 4 PORT;  Surgeon: [name], MD;  Location: [facility];  Service: General;  Laterality: N/A;  . [place] AND CURETTAGE OF UTERUS  1984  . HAND SURGERY    . [place]   x2  . KNEE SURGERY Left 1998  . LAPAROSCOPIC LYSIS OF ADHESIONS N/A 08/10/2016   Procedure: LAPAROSCOPIC LYSIS OF ADHESIONS WITH CORE LIVER BIOPSY;  Surgeon: [name], MD;  Location: [facility];  Service: General;  Laterality: N/A;  . LIPOSUCTION  2004   neck  . NASAL SINUS SURGERY  1995  . SKIN CANCER EXCISION  03/2016   melanoma, back  . Thumb surg    . TONGUE BASE REDUCTION SOMNOPLASTY  2000   Removed uvula  . TONSILLECTOMY AND ADENOIDECTOMY  1959  . WISDOM TOOTH EXTRACTION  1974    FAMILY HISTORY: Family History  Problem Relation Age of Onset  . Diabetes Mother   . Hypertension Mother   . Osteoporosis Mother   . Hypertension Father   . Heart disease Father   . Prostate cancer Father   . Diabetes Brother   . Hypertension Brother   . Cancer Other        Niece-Glioblastoma  . Breast cancer Neg Hx     SOCIAL HISTORY: Social History   Socioeconomic History  . Marital status: Single    Spouse name: Not on file  . Number of children: 0  . Years of education: Not on file  . 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 Needs  . Financial resource strain: Not on file  . Food insecurity    Worry: Not on file    Inability: Not on file  . Transportation needs    Medical: Not on file    Non-medical: Not on file  Tobacco Use  . Smoking status: Never Smoker  . Smokeless tobacco: Never Used  Substance and Sexual Activity  . Alcohol use: No  . Drug use: No  . Sexual activity: Never    Birth control/protection: Post-menopausal  Lifestyle  . Physical activity    Days per week: Not on file    Minutes  per session: Not on file  . Stress: Not on file  Relationships  . Social Herbalist on phone: Not on file    Gets together: Not on file    Attends religious service: Not on file    Active member of club or organization: Not on file    Attends meetings of clubs or organizations: Not on file  Relationship status: Not on file  . Intimate partner violence    Fear of current or ex partner: Not on file    Emotionally abused: Not on file    Physically abused: Not on file    Forced sexual activity: Not on file  Other Topics Concern  . Not on file  Social History Narrative   Lives alone   Caffeine- 1 daily, 16 oz     PHYSICAL EXAM  GENERAL EXAM/CONSTITUTIONAL: Vitals:  Vitals:   06/19/19 0945  BP: 135/79  Pulse: 91  Temp: 97.8 F (36.6 C)  Weight: 121 lb 9.6 oz (55.2 kg)  Height: 4\' 11"  (1.499 m)     Body mass index is 24.56 kg/m. Wt Readings from Last 3 Encounters:  06/19/19 121 lb 9.6 oz (55.2 kg)  12/07/18 117 lb (53.1 kg)  02/21/17 123 lb (55.8 kg)     Patient is in no distress; well developed, nourished and groomed; neck is supple  CARDIOVASCULAR:  Examination of carotid arteries is normal; no carotid bruits  Regular rate and rhythm, no murmurs  Examination of peripheral vascular system by observation and palpation is normal  EYES:  Ophthalmoscopic exam of optic discs and posterior segments is normal; no papilledema or hemorrhages  No exam data present  MUSCULOSKELETAL:  Gait, strength, tone, movements noted in Neurologic exam below  NEUROLOGIC: MENTAL STATUS:  MMSE - Mini Mental State Exam 06/19/2019  Orientation to time 5  Orientation to Place 5  Registration 3  Attention/ Calculation 3  Recall 2  Language- name 2 objects 2  Language- repeat 1  Language- follow 3 step command 3  Language- read & follow direction 1  Write a sentence 1  Copy design 1  Total score 27    awake, alert, oriented to person, place and time  recent  and remote memory intact  normal attention and concentration  language fluent, comprehension intact, naming intact  fund of knowledge appropriate  CRANIAL NERVE:   2nd - no papilledema on fundoscopic exam  2nd, 3rd, 4th, 6th - pupils equal and reactive to light, visual fields full to confrontation, extraocular muscles intact, no nystagmus  5th - facial sensation symmetric  7th - facial strength symmetric  8th - hearing intact  9th - palate elevates symmetrically, uvula midline  11th - shoulder shrug symmetric  12th - tongue protrusion midline  MOTOR:   normal bulk and tone, full strength in the BUE, BLE  SENSORY:   normal and symmetric to light touch, temperature, vibration  COORDINATION:   finger-nose-finger, fine finger movements normal  REFLEXES:   deep tendon reflexes present and symmetric  GAIT/STATION:   narrow based gait; able to walk on toes, heels     DIAGNOSTIC DATA (LABS, IMAGING, TESTING) - I reviewed patient records, labs, notes, testing and imaging myself where available.  Lab Results  Component Value Date   WBC 12.6 (H) 12/11/2018   HGB 12.8 12/11/2018   HCT 42.0 12/11/2018   MCV 96.3 12/11/2018   PLT 199 12/11/2018      Component Value Date/Time   NA 142 12/13/2018 0534   K 3.5 12/13/2018 0534   CL 111 12/13/2018 0534   CO2 20 (L) 12/13/2018 0534   GLUCOSE 140 (H) 12/13/2018 0534   BUN 13 12/13/2018 0534   CREATININE 0.95 12/13/2018 0534   CALCIUM 8.4 (L) 12/13/2018 0534   CALCIUM 8.7 06/28/2011 0441   PROT 8.2 (H) 12/07/2018 1624   ALBUMIN 3.4 (L) 12/11/2018 0425  AST 18 12/07/2018 1624   ALT 22 12/07/2018 1624   ALKPHOS 62 12/07/2018 1624   BILITOT 1.2 12/07/2018 1624   GFRNONAA >60 12/13/2018 0534   GFRAA >60 12/13/2018 0534   No results found for: CHOL, HDL, LDLCALC, LDLDIRECT, TRIG, CHOLHDL No results found for: HGBA1C No results found for: VITAMINB12 No results found for: TSH   05/07/19 MRI brain [I reviewed  images myself and agree with interpretation, except I would grade chronic small vessel ischemic disease as mild - moderate. -VRP]  1. No acute intracranial abnormality. 2. Moderate to severe chronic small vessel ischemic disease, progressed from 2018.    ASSESSMENT AND PLAN  68 y.o. year old female here with:  Dx:  1. Word finding difficulty   2. Left leg weakness       PLAN:  WORD FINDING DIFF / MEMORY LAPSE - could be related to stress factors (estranged from close friend x 1.5 years; hospitalized in Dec 2019) - optimized brain healthy habits; nutrition, exercise, memory stimulating activities  LEFT LEG WEAKNESS  - improved   Mild - moderate cerebral chronic small vessel ischemic disease  - continue BP control  Return for pending if symptoms worsen or fail to improve.    Penni Bombard, MD 04/14/2991, 42:68 AM Certified in Neurology, Neurophysiology and Neuroimaging  [facility] [address] [place], [place] [address] [phone]

## 2019-06-21 ENCOUNTER — Ambulatory Visit: Payer: Self-pay | Admitting: Diagnostic Neuroimaging

## 2019-07-25 ENCOUNTER — Other Ambulatory Visit (HOSPITAL_COMMUNITY): Payer: Self-pay | Admitting: Endocrinology

## 2019-07-25 ENCOUNTER — Ambulatory Visit (HOSPITAL_COMMUNITY)
Admission: RE | Admit: 2019-07-25 | Discharge: 2019-07-25 | Disposition: A | Discharge status: Home / Self Care | Payer: Medicare Other | Source: Ambulatory Visit | Attending: Family | Admitting: Family

## 2019-07-25 ENCOUNTER — Telehealth (HOSPITAL_COMMUNITY): Payer: Self-pay | Admitting: Rehabilitation

## 2019-07-25 ENCOUNTER — Other Ambulatory Visit: Payer: Self-pay

## 2019-07-25 DIAGNOSIS — M7989 Other specified soft tissue disorders: Secondary | ICD-10-CM

## 2019-07-25 NOTE — Telephone Encounter (Signed)

## 2019-08-01 ENCOUNTER — Other Ambulatory Visit: Payer: Self-pay | Admitting: Internal Medicine

## 2019-08-01 DIAGNOSIS — Z1231 Encounter for screening mammogram for malignant neoplasm of breast: Secondary | ICD-10-CM

## 2019-09-16 ENCOUNTER — Ambulatory Visit
Admission: RE | Admit: 2019-09-16 | Discharge: 2019-09-16 | Disposition: A | Discharge status: Home / Self Care | Payer: Medicare Other | Source: Ambulatory Visit | Attending: Internal Medicine | Admitting: Internal Medicine

## 2019-09-16 ENCOUNTER — Other Ambulatory Visit: Payer: Self-pay

## 2019-09-16 DIAGNOSIS — Z1231 Encounter for screening mammogram for malignant neoplasm of breast: Secondary | ICD-10-CM

## 2020-04-11 DIAGNOSIS — G459 Transient cerebral ischemic attack, unspecified: Secondary | ICD-10-CM

## 2020-04-11 HISTORY — DX: Transient cerebral ischemic attack, unspecified: G45.9

## 2020-05-22 DIAGNOSIS — R238 Other skin changes: Secondary | ICD-10-CM | POA: Diagnosis not present

## 2020-05-22 DIAGNOSIS — C4441 Basal cell carcinoma of skin of scalp and neck: Secondary | ICD-10-CM | POA: Diagnosis not present

## 2020-05-22 DIAGNOSIS — B353 Tinea pedis: Secondary | ICD-10-CM | POA: Diagnosis not present

## 2020-05-26 DIAGNOSIS — E860 Dehydration: Secondary | ICD-10-CM | POA: Diagnosis not present

## 2020-05-26 DIAGNOSIS — I129 Hypertensive chronic kidney disease with stage 1 through stage 4 chronic kidney disease, or unspecified chronic kidney disease: Secondary | ICD-10-CM | POA: Diagnosis not present

## 2020-05-26 DIAGNOSIS — E1129 Type 2 diabetes mellitus with other diabetic kidney complication: Secondary | ICD-10-CM | POA: Diagnosis not present

## 2020-05-26 DIAGNOSIS — E876 Hypokalemia: Secondary | ICD-10-CM | POA: Diagnosis not present

## 2020-05-26 DIAGNOSIS — N1831 Chronic kidney disease, stage 3a: Secondary | ICD-10-CM | POA: Diagnosis not present

## 2020-05-26 DIAGNOSIS — R531 Weakness: Secondary | ICD-10-CM | POA: Diagnosis not present

## 2020-05-26 DIAGNOSIS — Z8673 Personal history of transient ischemic attack (TIA), and cerebral infarction without residual deficits: Secondary | ICD-10-CM | POA: Diagnosis not present

## 2020-05-28 ENCOUNTER — Other Ambulatory Visit: Payer: Self-pay

## 2020-05-28 ENCOUNTER — Ambulatory Visit (HOSPITAL_COMMUNITY)
Admission: RE | Admit: 2020-05-28 | Discharge: 2020-05-28 | Disposition: A | Discharge status: Home / Self Care | Payer: Medicare PPO | Source: Ambulatory Visit | Attending: Vascular Surgery | Admitting: Vascular Surgery

## 2020-05-28 ENCOUNTER — Other Ambulatory Visit (HOSPITAL_COMMUNITY): Payer: Self-pay | Admitting: Internal Medicine

## 2020-05-28 DIAGNOSIS — R531 Weakness: Secondary | ICD-10-CM | POA: Insufficient documentation

## 2020-05-28 DIAGNOSIS — Z8673 Personal history of transient ischemic attack (TIA), and cerebral infarction without residual deficits: Secondary | ICD-10-CM | POA: Diagnosis not present

## 2020-07-20 ENCOUNTER — Other Ambulatory Visit: Payer: Self-pay | Admitting: Gastroenterology

## 2020-07-20 DIAGNOSIS — K5010 Crohn's disease of large intestine without complications: Secondary | ICD-10-CM

## 2020-07-20 DIAGNOSIS — R140 Abdominal distension (gaseous): Secondary | ICD-10-CM

## 2020-07-20 DIAGNOSIS — R197 Diarrhea, unspecified: Secondary | ICD-10-CM

## 2020-07-24 ENCOUNTER — Ambulatory Visit
Admission: RE | Admit: 2020-07-24 | Discharge: 2020-07-24 | Disposition: A | Discharge status: Home / Self Care | Payer: Medicare PPO | Source: Ambulatory Visit | Attending: Gastroenterology | Admitting: Gastroenterology

## 2020-07-24 ENCOUNTER — Other Ambulatory Visit: Payer: Self-pay

## 2020-07-24 DIAGNOSIS — R140 Abdominal distension (gaseous): Secondary | ICD-10-CM

## 2020-07-24 DIAGNOSIS — K6389 Other specified diseases of intestine: Secondary | ICD-10-CM | POA: Diagnosis not present

## 2020-07-24 DIAGNOSIS — N838 Other noninflammatory disorders of ovary, fallopian tube and broad ligament: Secondary | ICD-10-CM | POA: Diagnosis not present

## 2020-07-24 DIAGNOSIS — N200 Calculus of kidney: Secondary | ICD-10-CM | POA: Diagnosis not present

## 2020-07-24 DIAGNOSIS — K5010 Crohn's disease of large intestine without complications: Secondary | ICD-10-CM

## 2020-07-24 DIAGNOSIS — R197 Diarrhea, unspecified: Secondary | ICD-10-CM

## 2020-07-24 DIAGNOSIS — N83201 Unspecified ovarian cyst, right side: Secondary | ICD-10-CM | POA: Diagnosis not present

## 2020-07-27 DIAGNOSIS — E559 Vitamin D deficiency, unspecified: Secondary | ICD-10-CM | POA: Diagnosis not present

## 2020-07-27 DIAGNOSIS — E785 Hyperlipidemia, unspecified: Secondary | ICD-10-CM | POA: Diagnosis not present

## 2020-07-27 DIAGNOSIS — E538 Deficiency of other specified B group vitamins: Secondary | ICD-10-CM | POA: Diagnosis not present

## 2020-07-27 DIAGNOSIS — E1129 Type 2 diabetes mellitus with other diabetic kidney complication: Secondary | ICD-10-CM | POA: Diagnosis not present

## 2020-07-28 DIAGNOSIS — R1030 Lower abdominal pain, unspecified: Secondary | ICD-10-CM | POA: Diagnosis not present

## 2020-07-28 DIAGNOSIS — K5000 Crohn's disease of small intestine without complications: Secondary | ICD-10-CM | POA: Diagnosis not present

## 2020-08-03 DIAGNOSIS — K760 Fatty (change of) liver, not elsewhere classified: Secondary | ICD-10-CM | POA: Diagnosis not present

## 2020-08-03 DIAGNOSIS — E1129 Type 2 diabetes mellitus with other diabetic kidney complication: Secondary | ICD-10-CM | POA: Diagnosis not present

## 2020-08-03 DIAGNOSIS — F329 Major depressive disorder, single episode, unspecified: Secondary | ICD-10-CM | POA: Diagnosis not present

## 2020-08-03 DIAGNOSIS — I129 Hypertensive chronic kidney disease with stage 1 through stage 4 chronic kidney disease, or unspecified chronic kidney disease: Secondary | ICD-10-CM | POA: Diagnosis not present

## 2020-08-03 DIAGNOSIS — E559 Vitamin D deficiency, unspecified: Secondary | ICD-10-CM | POA: Diagnosis not present

## 2020-08-03 DIAGNOSIS — E538 Deficiency of other specified B group vitamins: Secondary | ICD-10-CM | POA: Diagnosis not present

## 2020-08-03 DIAGNOSIS — Z0000 Encounter for general adult medical examination without abnormal findings: Secondary | ICD-10-CM | POA: Diagnosis not present

## 2020-08-03 DIAGNOSIS — H9190 Unspecified hearing loss, unspecified ear: Secondary | ICD-10-CM | POA: Diagnosis not present

## 2020-08-03 DIAGNOSIS — K5090 Crohn's disease, unspecified, without complications: Secondary | ICD-10-CM | POA: Diagnosis not present

## 2020-08-07 DIAGNOSIS — R1909 Other intra-abdominal and pelvic swelling, mass and lump: Secondary | ICD-10-CM | POA: Diagnosis not present

## 2020-08-07 DIAGNOSIS — N83201 Unspecified ovarian cyst, right side: Secondary | ICD-10-CM | POA: Diagnosis not present

## 2020-08-10 DIAGNOSIS — M79602 Pain in left arm: Secondary | ICD-10-CM | POA: Diagnosis not present

## 2020-08-10 DIAGNOSIS — S46012A Strain of muscle(s) and tendon(s) of the rotator cuff of left shoulder, initial encounter: Secondary | ICD-10-CM | POA: Diagnosis not present

## 2020-08-25 ENCOUNTER — Encounter: Payer: Self-pay | Admitting: Surgery

## 2020-08-25 DIAGNOSIS — N9489 Other specified conditions associated with female genital organs and menstrual cycle: Secondary | ICD-10-CM | POA: Diagnosis not present

## 2020-08-25 DIAGNOSIS — K5090 Crohn's disease, unspecified, without complications: Secondary | ICD-10-CM | POA: Diagnosis not present

## 2020-08-25 DIAGNOSIS — N958 Other specified menopausal and perimenopausal disorders: Secondary | ICD-10-CM | POA: Diagnosis not present

## 2020-09-04 DIAGNOSIS — E872 Acidosis: Secondary | ICD-10-CM | POA: Diagnosis not present

## 2020-09-04 DIAGNOSIS — N183 Chronic kidney disease, stage 3 unspecified: Secondary | ICD-10-CM | POA: Diagnosis not present

## 2020-09-04 DIAGNOSIS — K7581 Nonalcoholic steatohepatitis (NASH): Secondary | ICD-10-CM | POA: Diagnosis not present

## 2020-09-04 DIAGNOSIS — K5090 Crohn's disease, unspecified, without complications: Secondary | ICD-10-CM | POA: Diagnosis not present

## 2020-09-04 DIAGNOSIS — I129 Hypertensive chronic kidney disease with stage 1 through stage 4 chronic kidney disease, or unspecified chronic kidney disease: Secondary | ICD-10-CM | POA: Diagnosis not present

## 2020-09-22 DIAGNOSIS — K5000 Crohn's disease of small intestine without complications: Secondary | ICD-10-CM | POA: Diagnosis not present

## 2020-09-22 DIAGNOSIS — R1030 Lower abdominal pain, unspecified: Secondary | ICD-10-CM | POA: Diagnosis not present

## 2020-10-05 DIAGNOSIS — C44519 Basal cell carcinoma of skin of other part of trunk: Secondary | ICD-10-CM | POA: Diagnosis not present

## 2020-10-05 DIAGNOSIS — Z85820 Personal history of malignant melanoma of skin: Secondary | ICD-10-CM | POA: Diagnosis not present

## 2020-10-05 DIAGNOSIS — Z08 Encounter for follow-up examination after completed treatment for malignant neoplasm: Secondary | ICD-10-CM | POA: Diagnosis not present

## 2020-10-05 DIAGNOSIS — D692 Other nonthrombocytopenic purpura: Secondary | ICD-10-CM | POA: Diagnosis not present

## 2020-10-05 DIAGNOSIS — D239 Other benign neoplasm of skin, unspecified: Secondary | ICD-10-CM | POA: Diagnosis not present

## 2020-10-05 DIAGNOSIS — L570 Actinic keratosis: Secondary | ICD-10-CM | POA: Diagnosis not present

## 2020-10-05 DIAGNOSIS — Z85828 Personal history of other malignant neoplasm of skin: Secondary | ICD-10-CM | POA: Diagnosis not present

## 2020-10-05 DIAGNOSIS — C44529 Squamous cell carcinoma of skin of other part of trunk: Secondary | ICD-10-CM | POA: Diagnosis not present

## 2020-10-05 DIAGNOSIS — D0359 Melanoma in situ of other part of trunk: Secondary | ICD-10-CM | POA: Diagnosis not present

## 2020-10-13 ENCOUNTER — Other Ambulatory Visit: Payer: Self-pay

## 2020-10-13 ENCOUNTER — Encounter (HOSPITAL_COMMUNITY): Payer: Self-pay | Admitting: Emergency Medicine

## 2020-10-13 ENCOUNTER — Emergency Department (HOSPITAL_COMMUNITY)
Admission: EM | Admit: 2020-10-13 | Discharge: 2020-10-13 | Disposition: A | Discharge status: Home / Self Care | Payer: Medicare PPO | Attending: Emergency Medicine | Admitting: Emergency Medicine

## 2020-10-13 ENCOUNTER — Emergency Department (HOSPITAL_COMMUNITY): Payer: Medicare PPO

## 2020-10-13 DIAGNOSIS — Z79899 Other long term (current) drug therapy: Secondary | ICD-10-CM | POA: Diagnosis not present

## 2020-10-13 DIAGNOSIS — E1122 Type 2 diabetes mellitus with diabetic chronic kidney disease: Secondary | ICD-10-CM | POA: Insufficient documentation

## 2020-10-13 DIAGNOSIS — W19XXXA Unspecified fall, initial encounter: Secondary | ICD-10-CM

## 2020-10-13 DIAGNOSIS — S0003XA Contusion of scalp, initial encounter: Secondary | ICD-10-CM | POA: Diagnosis not present

## 2020-10-13 DIAGNOSIS — W010XXA Fall on same level from slipping, tripping and stumbling without subsequent striking against object, initial encounter: Secondary | ICD-10-CM | POA: Diagnosis not present

## 2020-10-13 DIAGNOSIS — S0083XA Contusion of other part of head, initial encounter: Secondary | ICD-10-CM | POA: Insufficient documentation

## 2020-10-13 DIAGNOSIS — I6782 Cerebral ischemia: Secondary | ICD-10-CM | POA: Diagnosis not present

## 2020-10-13 DIAGNOSIS — N183 Chronic kidney disease, stage 3 unspecified: Secondary | ICD-10-CM | POA: Diagnosis not present

## 2020-10-13 DIAGNOSIS — G319 Degenerative disease of nervous system, unspecified: Secondary | ICD-10-CM | POA: Diagnosis not present

## 2020-10-13 DIAGNOSIS — J321 Chronic frontal sinusitis: Secondary | ICD-10-CM | POA: Diagnosis not present

## 2020-10-13 DIAGNOSIS — I129 Hypertensive chronic kidney disease with stage 1 through stage 4 chronic kidney disease, or unspecified chronic kidney disease: Secondary | ICD-10-CM | POA: Insufficient documentation

## 2020-10-13 DIAGNOSIS — Z85820 Personal history of malignant melanoma of skin: Secondary | ICD-10-CM | POA: Diagnosis not present

## 2020-10-13 DIAGNOSIS — S199XXA Unspecified injury of neck, initial encounter: Secondary | ICD-10-CM | POA: Diagnosis not present

## 2020-10-13 DIAGNOSIS — S0990XA Unspecified injury of head, initial encounter: Secondary | ICD-10-CM | POA: Diagnosis not present

## 2020-10-13 DIAGNOSIS — J3489 Other specified disorders of nose and nasal sinuses: Secondary | ICD-10-CM | POA: Diagnosis not present

## 2020-10-13 DIAGNOSIS — M4312 Spondylolisthesis, cervical region: Secondary | ICD-10-CM | POA: Diagnosis not present

## 2020-10-13 NOTE — ED Provider Notes (Signed)
[facility] DEPT Provider Note   CSN: [id number] Arrival date & time: 10/13/20  1649     History Chief Complaint  Patient presents with  . Fall    Alexis Ramos is a 69 y.o. female.  HPI Patient is a 69 year old female with a medical history as noted below.  She states earlier today she was picking up something outside and her foot got caught causing her to trip and fall to the ground.  She states she had her posterior head on the concrete.  She reports pain over the occiput as well as neck stiffness.  Reports a history of prior cervical spine surgery.  She called her PCP who recommended that she come to the emergency department for a CT scan.  She confirms her medical history listed below.  She is not anticoagulated.  Denies syncope, chest pain, shortness of breath, lightheadedness, numbness, weakness.    Past Medical History:  Diagnosis Date  . Arthritis   . Cancer ([place])    melanoma on back   . Crohn's disease ([place])   . Depression   . DES exposure in utero   . Diabetes ([place])    type 2  . Forgetfulness   . Hyperlipidemia   . Hypertension   . Kidney stones   . Menopausal symptoms   . SBO (small bowel obstruction) ([place]) 06/2011, 11/2018   tx w/steroids  . Snoring     Patient Active Problem List   Diagnosis Date Noted  . Crohn's disease involving terminal ileum ([place]) 12/07/2018  . Depression 12/07/2018  . Nonalcoholic steatohepatitis 40/98/1191  . Chronic cholecystitis without calculus s/p lap cholecystectomy 08/10/2016 08/10/2016  . Chronic kidney disease, stage III (moderate) ([place]) 03/27/2015  . Dyslipidemia 03/25/2015  . History of resection of small bowel 03/25/2015  . DES exposure in utero   . Hypertension   . Crohn's disease ([place])   . Menopausal symptoms     Past Surgical History:  Procedure Laterality Date  . Anal abscess surg    . anal sphincterectomy    . APPENDECTOMY  1970  . [place]    small, x 3  . BREAST BIOPSY Left   . BREAST SURGERY     Biopsy-Benign  . CERVICAL FUSION  05/2009   C4-C7  . CHOLECYSTECTOMY N/A 08/10/2016   Procedure: LAPAROSCOPIC CHOLECYSTECTOMY WITH INTRAOPERATIVE CHOLANGIOGRAM -STANDARD 4 PORT;  Surgeon: [name], MD;  Location: [facility] ORS;  Service: General;  Laterality: N/A;  . [place] AND CURETTAGE OF UTERUS  1984  . HAND SURGERY    . ILEOCECETOMY  1971   Crohns disease flare  . [place]   x2  . KNEE SURGERY Left 1998  . LAPAROSCOPIC LYSIS OF ADHESIONS N/A 08/10/2016   Procedure: LAPAROSCOPIC LYSIS OF ADHESIONS WITH CORE LIVER BIOPSY;  Surgeon: [name], MD;  Location: [facility] ORS;  Service: General;  Laterality: N/A;  . LIPOSUCTION  2004   neck  . NASAL SINUS SURGERY  1995  . SKIN CANCER EXCISION  03/2016   melanoma, back  . SMALL INTESTINE SURGERY  1985   Crohn stricture  . SMALL INTESTINE SURGERY  1996   Crohn stricture  . Thumb surg    . TONGUE BASE REDUCTION SOMNOPLASTY  2000   Removed uvula  . TONSILLECTOMY AND ADENOIDECTOMY  1959  . [place] EXTRACTION  1974     OB History    Gravida  0   Para  Term      Preterm      AB      Living        SAB      TAB      Ectopic      Multiple      Live Births              Family History  Problem Relation Age of Onset  . Diabetes Mother   . Hypertension Mother   . Osteoporosis Mother   . Hypertension Father   . Heart disease Father   . Prostate cancer Father   . Diabetes Brother   . Hypertension Brother   . Cancer Other        Niece-Glioblastoma  . Breast cancer Neg Hx     Social History   Tobacco Use  . Smoking status: Never Smoker  . Smokeless tobacco: Never Used  Substance Use Topics  . Alcohol use: No  . Drug use: No    Home Medications Prior to Admission medications   Medication Sig Start Date End Date Taking? Authorizing Provider  amLODipine (NORVASC) 5 MG tablet Take 5 mg by mouth daily. 02/28/16   [provider]  Cholecalciferol (VITAMIN D-3) 5000 units TABS Take 5,000 Units by mouth daily.    [provider]  Cyanocobalamin (VITAMIN B-12 IJ) Inject as directed every 21 ( twenty-one) days.     [provider]  losartan (COZAAR) 100 MG tablet 100 mg daily. 05/14/19   [provider]  MEGARED OMEGA-3 KRILL OIL PO Take 1 tablet by mouth daily.    [provider]  metoprolol succinate (TOPROL-XL) 25 MG 24 hr tablet Take 25 mg by mouth daily. 05/01/16   [provider]  omeprazole (PRILOSEC) 20 MG capsule 20 mg daily. 05/11/19   [provider]  venlafaxine (EFFEXOR) 75 MG tablet Take 75-150 mg by mouth See admin instructions. Takes 1106m in the morning and at lunch time and 77mat bedtime 05/16/16   [provider]  Vitamin E 400 units TABS Take 800 Units by mouth daily.    [provider]    Allergies    Nsaids, Penicillins, Advil [ibuprofen], Asa [aspirin], and Daypro [oxaprozin]  Review of Systems   Review of Systems  All other systems reviewed and are negative. Ten systems reviewed and are negative for acute change, except as noted in the HPI.   Physical Exam Updated Vital Signs BP (!) 162/74 (BP Location: Right Arm)   Pulse 78   Temp 98 F (36.7 C) (Oral)   Resp 18   Ht 4' 11"  (1.499 m)   Wt 54.9 kg   SpO2 99%   BMI 24.44 kg/m   Physical Exam Vitals and nursing note reviewed.  Constitutional:      General: She is not in acute distress.    Appearance: Normal appearance. She is not ill-appearing, toxic-appearing or diaphoretic.  HENT:     Head: Normocephalic.     Comments: Small hematoma with no wounds or bleeding noted to the right parietal scalp.  Mild tenderness overlying the site.  No crepitus.    Right Ear: Tympanic membrane, ear canal and external ear normal. There is no impacted cerumen.     Left Ear: Tympanic membrane, ear canal and external ear normal. There is no impacted cerumen.     Ears:     Comments:  Normal-appearing bilateral EACs and TMs.  No hemotympanum.    Nose: Nose normal.  Mouth/Throat:     Mouth: Mucous membranes are moist.     Pharynx: Oropharynx is clear. No oropharyngeal exudate or posterior oropharyngeal erythema.  Eyes:     General: No scleral icterus.       Right eye: No discharge.        Left eye: No discharge.     Extraocular Movements: Extraocular movements intact.     Conjunctiva/sclera: Conjunctivae normal.  Cardiovascular:     Rate and Rhythm: Normal rate and regular rhythm.     Pulses: Normal pulses.     Heart sounds: Normal heart sounds. No murmur heard.  No friction rub. No gallop.      Comments: Regular rate and rhythm without murmurs, rubs, gallops. Pulmonary:     Effort: Pulmonary effort is normal. No respiratory distress.     Breath sounds: Normal breath sounds. No stridor. No wheezing, rhonchi or rales.     Comments: Lungs clear to auscultation bilaterally. Abdominal:     General: Abdomen is flat.     Tenderness: There is no abdominal tenderness.  Musculoskeletal:        General: Normal range of motion.     Cervical back: Normal range of motion and neck supple. No tenderness.  Skin:    General: Skin is warm and dry.  Neurological:     General: No focal deficit present.     Mental Status: She is alert and oriented to person, place, and time.     Comments: Patient is oriented to person, place, and time. Patient phonates in clear, complete, and coherent sentences. Negative arm drift. Strength is 5/5 in all four extremities. Distal sensation intact in all four extremities.  Psychiatric:        Mood and Affect: Mood normal.        Behavior: Behavior normal.    ED Results / Procedures / Treatments   Labs (all labs ordered are listed, but only abnormal results are displayed) Labs Reviewed - No data to display  EKG None  Radiology CT Head Wo Contrast  Result Date: 10/13/2020 CLINICAL DATA:  Head trauma, minor. Additional history provided:  Mechanical fall, hitting posterior head on concrete. EXAM: CT HEAD WITHOUT CONTRAST CT CERVICAL SPINE WITHOUT CONTRAST TECHNIQUE: Multidetector CT imaging of the head and cervical spine was performed following the standard protocol without intravenous contrast. Multiplanar CT image reconstructions of the cervical spine were also generated. COMPARISON:  Brain MRI 05/07/2019. CT of the cervical spine 02/21/2017 FINDINGS: CT HEAD FINDINGS Brain: Mild generalized cerebral atrophy. Moderate ill-defined hypoattenuation within the cerebral white matter is nonspecific, but compatible with chronic small vessel ischemic disease. There is no acute intracranial hemorrhage. No demarcated cortical infarct. No extra-axial fluid collection. No evidence of intracranial mass. No midline shift. Vascular: No hyperdense vessel.  Atherosclerotic calcifications. Skull: Normal. Negative for fracture or focal lesion. Sinuses/Orbits: Visualized orbits show no acute finding. Moderate mucosal thickening within the inferior right frontal sinus and anterior right ethmoid air cells. Other: Right posterior parietal scalp hematoma. CT CERVICAL SPINE FINDINGS Alignment: Straightening of the expected cervical lordosis. 2 mm C3-C4 grade 1 anterolisthesis. Skull base and vertebrae: The basion-dental and atlanto-dental intervals are maintained.No evidence of acute fracture to the cervical spine. Soft tissues and spinal canal: No prevertebral fluid or swelling. No visible canal hematoma. Disc levels: Sequela of prior C4-C7 ACDF. No evidence of hardware compromise. Adjacent level spondylosis with moderate C3-C4 disc space narrowing and moderate C7-T1 disc space narrowing. Shallow C3-C4 disc bulge. Multilevel facet arthrosis. Fusion  across the C2-C3 facet joints bilaterally. Ligamentous hypertrophy/pannus posterior to the dens. Upper chest: No consolidation within the imaged lung apices. No visible pneumothorax. IMPRESSION: CT head: 1. No evidence of acute  intracranial abnormality. 2. Right posterior parietal scalp hematoma. 3. Mild cerebral atrophy with moderate chronic small vessel ischemic disease. 4. Moderate right frontal and right ethmoid sinus mucosal thickening. CT cervical spine: 1. No evidence of acute fracture to the cervical spine. 2. C3-C4 grade 1 anterolisthesis. 3. Sequela of prior C4-C7 ACDF.  No evidence of hardware compromise. 4. Cervical spondylosis as described, including adjacent level disease at C3-C4 and C7-T1. Electronically Signed   By: Kellie Simmering DO   On: 10/13/2020 18:15   CT Cervical Spine Wo Contrast  Result Date: 10/13/2020 CLINICAL DATA:  Head trauma, minor. Additional history provided: Mechanical fall, hitting posterior head on concrete. EXAM: CT HEAD WITHOUT CONTRAST CT CERVICAL SPINE WITHOUT CONTRAST TECHNIQUE: Multidetector CT imaging of the head and cervical spine was performed following the standard protocol without intravenous contrast. Multiplanar CT image reconstructions of the cervical spine were also generated. COMPARISON:  Brain MRI 05/07/2019. CT of the cervical spine 02/21/2017 FINDINGS: CT HEAD FINDINGS Brain: Mild generalized cerebral atrophy. Moderate ill-defined hypoattenuation within the cerebral white matter is nonspecific, but compatible with chronic small vessel ischemic disease. There is no acute intracranial hemorrhage. No demarcated cortical infarct. No extra-axial fluid collection. No evidence of intracranial mass. No midline shift. Vascular: No hyperdense vessel.  Atherosclerotic calcifications. Skull: Normal. Negative for fracture or focal lesion. Sinuses/Orbits: Visualized orbits show no acute finding. Moderate mucosal thickening within the inferior right frontal sinus and anterior right ethmoid air cells. Other: Right posterior parietal scalp hematoma. CT CERVICAL SPINE FINDINGS Alignment: Straightening of the expected cervical lordosis. 2 mm C3-C4 grade 1 anterolisthesis. Skull base and vertebrae: The  basion-dental and atlanto-dental intervals are maintained.No evidence of acute fracture to the cervical spine. Soft tissues and spinal canal: No prevertebral fluid or swelling. No visible canal hematoma. Disc levels: Sequela of prior C4-C7 ACDF. No evidence of hardware compromise. Adjacent level spondylosis with moderate C3-C4 disc space narrowing and moderate C7-T1 disc space narrowing. Shallow C3-C4 disc bulge. Multilevel facet arthrosis. Fusion across the C2-C3 facet joints bilaterally. Ligamentous hypertrophy/pannus posterior to the dens. Upper chest: No consolidation within the imaged lung apices. No visible pneumothorax. IMPRESSION: CT head: 1. No evidence of acute intracranial abnormality. 2. Right posterior parietal scalp hematoma. 3. Mild cerebral atrophy with moderate chronic small vessel ischemic disease. 4. Moderate right frontal and right ethmoid sinus mucosal thickening. CT cervical spine: 1. No evidence of acute fracture to the cervical spine. 2. C3-C4 grade 1 anterolisthesis. 3. Sequela of prior C4-C7 ACDF.  No evidence of hardware compromise. 4. Cervical spondylosis as described, including adjacent level disease at C3-C4 and C7-T1. Electronically Signed   By: Kellie Simmering DO   On: 10/13/2020 18:15   Procedures Procedures   Medications Ordered in ED Medications - No data to display  ED Course  I have reviewed the triage vital signs and the nursing notes.  Pertinent labs & imaging results that were available during my care of the patient were reviewed by me and considered in my medical decision making (see chart for details).    MDM Rules/Calculators/A&P                          Patient is a 69 year old female who presents to the emergency department due to a fall that  occurred prior to arrival.  Physical exam today is reassuring.  She has a small hematoma to the right parietal scalp but otherwise no acute abnormalities.  Neurological exam is benign.  Her PCP requested that she come  to the emergency department for a CT scan of the head and neck.  CT scans obtained with findings as noted below.  This was reviewed and interpreted myself.  CT head:    1. No evidence of acute intracranial abnormality.  2. Right posterior parietal scalp hematoma.  3. Mild cerebral atrophy with moderate chronic small vessel ischemic  disease.  4. Moderate right frontal and right ethmoid sinus mucosal  thickening.    CT cervical spine:    1. No evidence of acute fracture to the cervical spine.  2. C3-C4 grade 1 anterolisthesis.  3. Sequela of prior C4-C7 ACDF. No evidence of hardware compromise.  4. Cervical spondylosis as described, including adjacent level  disease at C3-C4 and C7-T1.   Feel the patient is safe for discharge at this time and she is amenable.  Understands to return to the ED with any new or worsening symptoms.  Her questions were answered and she was amicable at the time of discharge.  Her vital signs are stable.  She is afebrile, nontachycardic, normotensive, not hypoxic.  Note: Portions of this report may have been transcribed using voice recognition software. Every effort was made to ensure accuracy; however, inadvertent computerized transcription errors may be present.   Final Clinical Impression(s) / ED Diagnoses Final diagnoses:  Fall, initial encounter  Injury of head, initial encounter   Rx / DC Orders ED Discharge Orders    None       Rayna Sexton, PA-C 10/13/20 1833    Quintella Reichert, MD 10/14/20 [phone]

## 2020-10-13 NOTE — Discharge Instructions (Signed)
If you develop new or worsening symptoms, please return to the emergency department.  Please follow-up with your primary care provider regarding this visit.  It was a pleasure to meet you.

## 2020-10-13 NOTE — ED Triage Notes (Signed)
Patient had a mechanical fall, hit posterior head on concrete, called her PCP and they recommended her come to the ED for a CT scan.

## 2020-11-09 DIAGNOSIS — N83201 Unspecified ovarian cyst, right side: Secondary | ICD-10-CM | POA: Diagnosis not present

## 2020-12-01 DIAGNOSIS — M5416 Radiculopathy, lumbar region: Secondary | ICD-10-CM | POA: Diagnosis not present

## 2020-12-01 DIAGNOSIS — I700 Atherosclerosis of aorta: Secondary | ICD-10-CM | POA: Diagnosis not present

## 2020-12-01 DIAGNOSIS — K5090 Crohn's disease, unspecified, without complications: Secondary | ICD-10-CM | POA: Diagnosis not present

## 2020-12-01 DIAGNOSIS — I129 Hypertensive chronic kidney disease with stage 1 through stage 4 chronic kidney disease, or unspecified chronic kidney disease: Secondary | ICD-10-CM | POA: Diagnosis not present

## 2020-12-01 DIAGNOSIS — E1129 Type 2 diabetes mellitus with other diabetic kidney complication: Secondary | ICD-10-CM | POA: Diagnosis not present

## 2020-12-01 DIAGNOSIS — N1831 Chronic kidney disease, stage 3a: Secondary | ICD-10-CM | POA: Diagnosis not present

## 2021-01-05 ENCOUNTER — Other Ambulatory Visit: Payer: Self-pay | Admitting: Internal Medicine

## 2021-01-05 DIAGNOSIS — Z1231 Encounter for screening mammogram for malignant neoplasm of breast: Secondary | ICD-10-CM

## 2021-01-29 DIAGNOSIS — Z1231 Encounter for screening mammogram for malignant neoplasm of breast: Secondary | ICD-10-CM | POA: Diagnosis not present

## 2021-01-29 DIAGNOSIS — Z87898 Personal history of other specified conditions: Secondary | ICD-10-CM | POA: Diagnosis not present

## 2021-01-29 DIAGNOSIS — Z01419 Encounter for gynecological examination (general) (routine) without abnormal findings: Secondary | ICD-10-CM | POA: Diagnosis not present

## 2021-01-29 DIAGNOSIS — Z124 Encounter for screening for malignant neoplasm of cervix: Secondary | ICD-10-CM | POA: Diagnosis not present

## 2021-01-29 DIAGNOSIS — N83209 Unspecified ovarian cyst, unspecified side: Secondary | ICD-10-CM | POA: Diagnosis not present

## 2021-02-08 ENCOUNTER — Other Ambulatory Visit: Payer: Self-pay

## 2021-02-08 ENCOUNTER — Emergency Department (HOSPITAL_COMMUNITY): Payer: Medicare PPO

## 2021-02-08 ENCOUNTER — Emergency Department (HOSPITAL_COMMUNITY)
Admission: EM | Admit: 2021-02-08 | Discharge: 2021-02-08 | Disposition: A | Discharge status: Home / Self Care | Payer: Medicare PPO | Attending: Emergency Medicine | Admitting: Emergency Medicine

## 2021-02-08 ENCOUNTER — Encounter (HOSPITAL_COMMUNITY): Payer: Self-pay

## 2021-02-08 DIAGNOSIS — W01198A Fall on same level from slipping, tripping and stumbling with subsequent striking against other object, initial encounter: Secondary | ICD-10-CM | POA: Insufficient documentation

## 2021-02-08 DIAGNOSIS — S0181XA Laceration without foreign body of other part of head, initial encounter: Secondary | ICD-10-CM | POA: Insufficient documentation

## 2021-02-08 DIAGNOSIS — S0990XA Unspecified injury of head, initial encounter: Secondary | ICD-10-CM | POA: Diagnosis present

## 2021-02-08 DIAGNOSIS — S161XXA Strain of muscle, fascia and tendon at neck level, initial encounter: Secondary | ICD-10-CM | POA: Insufficient documentation

## 2021-02-08 DIAGNOSIS — R519 Headache, unspecified: Secondary | ICD-10-CM | POA: Diagnosis not present

## 2021-02-08 DIAGNOSIS — M542 Cervicalgia: Secondary | ICD-10-CM | POA: Diagnosis not present

## 2021-02-08 DIAGNOSIS — E1122 Type 2 diabetes mellitus with diabetic chronic kidney disease: Secondary | ICD-10-CM | POA: Diagnosis not present

## 2021-02-08 DIAGNOSIS — Z85820 Personal history of malignant melanoma of skin: Secondary | ICD-10-CM | POA: Insufficient documentation

## 2021-02-08 DIAGNOSIS — N183 Chronic kidney disease, stage 3 unspecified: Secondary | ICD-10-CM | POA: Insufficient documentation

## 2021-02-08 DIAGNOSIS — I129 Hypertensive chronic kidney disease with stage 1 through stage 4 chronic kidney disease, or unspecified chronic kidney disease: Secondary | ICD-10-CM | POA: Diagnosis not present

## 2021-02-08 MED ORDER — LIDOCAINE-EPINEPHRINE-TETRACAINE (LET) TOPICAL GEL
3.0000 mL | Freq: Once | TOPICAL | Status: AC
  Administered 2021-02-08: 3 mL via TOPICAL
  Filled 2021-02-08: qty 3

## 2021-02-08 NOTE — ED Notes (Signed)
An After Visit Summary was printed and given to the patient. Discharge instructions given and no further questions at this time.  

## 2021-02-08 NOTE — ED Provider Notes (Signed)
[facility] DEPT Provider Note   CSN: [id number] Arrival date & time: 02/08/21  1820     History Chief Complaint  Patient presents with  . Laceration    Alexis DISE is a 70 y.o. female.  70 year old female with history of melanoma, crohn's disease, DM, presents for laceration to the right forehead after a fall tonight. Patient states she tripped over her dog and fell into a table, hitting her head on the table. No LOC, not anticoagulated. Reports pain to her head as well as neck pain. Denies injury to her extremities, no other injuries or concerns.         Past Medical History:  Diagnosis Date  . Arthritis   . Cancer ([place])    melanoma on back   . Crohn's disease ([place])   . Depression   . DES exposure in utero   . Diabetes ([place])    type 2  . Forgetfulness   . Hyperlipidemia   . Hypertension   . Kidney stones   . Menopausal symptoms   . SBO (small bowel obstruction) ([place]) 06/2011, 11/2018   tx w/steroids  . Snoring     Patient Active Problem List   Diagnosis Date Noted  . Crohn's disease involving terminal ileum ([place]) 12/07/2018  . Depression 12/07/2018  . Nonalcoholic steatohepatitis 47/08/6282  . Chronic cholecystitis without calculus s/p lap cholecystectomy 08/10/2016 08/10/2016  . Chronic kidney disease, stage III (moderate) ([place]) 03/27/2015  . Dyslipidemia 03/25/2015  . History of resection of small bowel 03/25/2015  . DES exposure in utero   . Hypertension   . Crohn's disease ([place])   . Menopausal symptoms     Past Surgical History:  Procedure Laterality Date  . Anal abscess surg    . anal sphincterectomy    . APPENDECTOMY  1970  . [place]   small, x 3  . BREAST BIOPSY Left   . BREAST SURGERY     Biopsy-Benign  . CERVICAL FUSION  05/2009   C4-C7  . CHOLECYSTECTOMY N/A 08/10/2016   Procedure: LAPAROSCOPIC CHOLECYSTECTOMY WITH INTRAOPERATIVE CHOLANGIOGRAM -STANDARD 4 PORT;  Surgeon:  [name], MD;  Location: [facility] ORS;  Service: General;  Laterality: N/A;  . [place] AND CURETTAGE OF UTERUS  1984  . HAND SURGERY    . ILEOCECETOMY  1971   Crohns disease flare  . [place]   x2  . KNEE SURGERY Left 1998  . LAPAROSCOPIC LYSIS OF ADHESIONS N/A 08/10/2016   Procedure: LAPAROSCOPIC LYSIS OF ADHESIONS WITH CORE LIVER BIOPSY;  Surgeon: [name], MD;  Location: [facility] ORS;  Service: General;  Laterality: N/A;  . LIPOSUCTION  2004   neck  . NASAL SINUS SURGERY  1995  . SKIN CANCER EXCISION  03/2016   melanoma, back  . SMALL INTESTINE SURGERY  1985   Crohn stricture  . SMALL INTESTINE SURGERY  1996   Crohn stricture  . Thumb surg    . TONGUE BASE REDUCTION SOMNOPLASTY  2000   Removed uvula  . TONSILLECTOMY AND ADENOIDECTOMY  1959  . WISDOM TOOTH EXTRACTION  1974     OB History    Gravida  0   Para      Term      Preterm      AB      Living        SAB      IAB      Ectopic  Multiple      Live Births              Family History  Problem Relation Age of Onset  . Diabetes Mother   . Hypertension Mother   . Osteoporosis Mother   . Hypertension Father   . Heart disease Father   . Prostate cancer Father   . Diabetes Brother   . Hypertension Brother   . Cancer Other        Niece-Glioblastoma  . Breast cancer Neg Hx     Social History   Tobacco Use  . Smoking status: Never Smoker  . Smokeless tobacco: Never Used  Substance Use Topics  . Alcohol use: No  . Drug use: No    Home Medications Prior to Admission medications   Medication Sig Start Date End Date Taking? Authorizing Provider  amLODipine (NORVASC) 5 MG tablet Take 5 mg by mouth daily. 02/28/16   [provider]  Cholecalciferol (VITAMIN D-3) 5000 units TABS Take 5,000 Units by mouth daily.    [provider]  Cyanocobalamin (VITAMIN B-12 IJ) Inject as directed every 21 ( twenty-one) days.     [provider]  losartan (COZAAR) 100 MG  tablet 100 mg daily. 05/14/19   [provider]  MEGARED OMEGA-3 KRILL OIL PO Take 1 tablet by mouth daily.    [provider]  metoprolol succinate (TOPROL-XL) 25 MG 24 hr tablet Take 25 mg by mouth daily. 05/01/16   [provider]  omeprazole (PRILOSEC) 20 MG capsule 20 mg daily. 05/11/19   [provider]  venlafaxine (EFFEXOR) 75 MG tablet Take 75-150 mg by mouth See admin instructions. Takes 150mg  in the morning and at lunch time and 75mg  at bedtime 05/16/16   [provider]  Vitamin E 400 units TABS Take 800 Units by mouth daily.    [provider]    Allergies    Nsaids, Penicillins, Advil [ibuprofen], Asa [aspirin], and Daypro [oxaprozin]  Review of Systems   Review of Systems  Constitutional: Negative for fever.  Eyes: Negative for visual disturbance.  Musculoskeletal: Positive for neck pain. Negative for arthralgias, back pain, gait problem, joint swelling and myalgias.  Skin: Positive for wound.  Allergic/Immunologic: Negative for immunocompromised state.  Neurological: Positive for headaches. Negative for dizziness, speech difficulty and weakness.  Hematological: Does not bruise/bleed easily.  Psychiatric/Behavioral: Negative for confusion.  All other systems reviewed and are negative.   Physical Exam Updated Vital Signs BP (!) 149/89   Pulse 88   Temp 98.3 F (36.8 C) (Oral)   Resp 18   Ht 4\' 11"  (1.499 m)   Wt 55.3 kg   SpO2 99%   BMI 24.64 kg/m   Physical Exam Vitals and nursing note reviewed.  Constitutional:      General: She is not in acute distress.    Appearance: She is well-developed and well-nourished. She is not diaphoretic.     Comments: Approximately 2.5 cm to right forehead, no active bleeding  HENT:     Head: Normocephalic.     Nose: Nose normal.     Mouth/Throat:     Mouth: Mucous membranes are moist.  Eyes:     Extraocular Movements: Extraocular movements intact.     Pupils: Pupils are  equal, round, and reactive to light.  Cardiovascular:     Pulses: Normal pulses.  Pulmonary:     Effort: Pulmonary effort is normal.  Musculoskeletal:        General: No  deformity or signs of injury.     Cervical back: Tenderness and bony tenderness present. No swelling or deformity.     Thoracic back: No tenderness or bony tenderness.     Lumbar back: No tenderness or bony tenderness.       Back:  Skin:    General: Skin is warm and dry.     Findings: No erythema or rash.  Neurological:     Mental Status: She is alert and oriented to person, place, and time.     Cranial Nerves: No cranial nerve deficit.     Sensory: No sensory deficit.     Motor: No weakness.     Gait: Gait normal.  Psychiatric:        Mood and Affect: Mood and affect normal.        Behavior: Behavior normal.     ED Results / Procedures / Treatments   Labs (all labs ordered are listed, but only abnormal results are displayed) Labs Reviewed - No data to display  EKG None  Radiology CT Head Wo Contrast  Result Date: 02/08/2021 CLINICAL DATA:  Head and neck pain. Patient fell, tripping on her dog tonight. EXAM: CT HEAD WITHOUT CONTRAST CT CERVICAL SPINE WITHOUT CONTRAST TECHNIQUE: Multidetector CT imaging of the head and cervical spine was performed following the standard protocol without intravenous contrast. Multiplanar CT image reconstructions of the cervical spine were also generated. COMPARISON:  10/13/2020 FINDINGS: CT HEAD FINDINGS Brain: Periventricular white matter changes are consistent with small vessel disease. There is no intra or extra-axial fluid collection or mass lesion. The basilar cisterns and ventricles have a normal appearance. There is no CT evidence for acute infarction or hemorrhage. Vascular: No hyperdense vessel or unexpected calcification. Skull: Normal. Negative for fracture or focal lesion. Sinuses/Orbits: No acute finding. Other: None. CT CERVICAL SPINE FINDINGS Alignment: Normal.   Status post anterior fusion of C4-C7. Skull base and vertebrae: No acute fracture. No primary bone lesion or focal pathologic process. Soft tissues and spinal canal: No prevertebral fluid or swelling. No visible canal hematoma.1 Disc levels:  Maintained Upper chest: Negative. Other: None IMPRESSION: 1. No evidence for acute intracranial abnormality. 2. Atrophy and small vessel disease. 3. Status post anterior fusion of C4-C7. 4. No evidence for acute cervical spine abnormality. Electronically Signed   By: Nolon Nations M.D.   On: 02/08/2021 20:04   CT Cervical Spine Wo Contrast  Result Date: 02/08/2021 CLINICAL DATA:  Head and neck pain. Patient fell, tripping on her dog tonight. EXAM: CT HEAD WITHOUT CONTRAST CT CERVICAL SPINE WITHOUT CONTRAST TECHNIQUE: Multidetector CT imaging of the head and cervical spine was performed following the standard protocol without intravenous contrast. Multiplanar CT image reconstructions of the cervical spine were also generated. COMPARISON:  10/13/2020 FINDINGS: CT HEAD FINDINGS Brain: Periventricular white matter changes are consistent with small vessel disease. There is no intra or extra-axial fluid collection or mass lesion. The basilar cisterns and ventricles have a normal appearance. There is no CT evidence for acute infarction or hemorrhage. Vascular: No hyperdense vessel or unexpected calcification. Skull: Normal. Negative for fracture or focal lesion. Sinuses/Orbits: No acute finding. Other: None. CT CERVICAL SPINE FINDINGS Alignment: Normal.  Status post anterior fusion of C4-C7. Skull base and vertebrae: No acute fracture. No primary bone lesion or focal pathologic process. Soft tissues and spinal canal: No prevertebral fluid or swelling. No visible canal hematoma.1 Disc levels:  Maintained Upper chest: Negative. Other: None IMPRESSION: 1. No evidence for acute intracranial abnormality.  2. Atrophy and small vessel disease. 3. Status post anterior fusion of C4-C7.  4. No evidence for acute cervical spine abnormality. Electronically Signed   By: Nolon Nations M.D.   On: 02/08/2021 20:04    Procedures .Marland KitchenLaceration Repair  Date/Time: 02/08/2021 8:27 PM Performed by: Tacy Learn, PA-C Authorized by: Tacy Learn, PA-C   Consent:    Consent obtained:  Verbal   Consent given by:  Patient   Risks discussed:  Infection, need for additional repair, pain, poor cosmetic result and poor wound healing   Alternatives discussed:  No treatment and delayed treatment Universal protocol:    Procedure explained and questions answered to patient or proxy's satisfaction: yes     Relevant documents present and verified: yes     Test results available: yes     Imaging studies available: yes     Required blood products, implants, devices, and special equipment available: yes     Site/side marked: yes     Immediately prior to procedure, a time out was called: yes     Patient identity confirmed:  Verbally with patient Anesthesia:    Anesthesia method:  Topical application   Topical anesthetic:  LET Laceration details:    Location:  Face   Face location:  Forehead   Length (cm):  2.5   Depth (mm):  4 Pre-procedure details:    Preparation:  Patient was prepped and draped in usual sterile fashion and imaging obtained to evaluate for foreign bodies Exploration:    Hemostasis achieved with:  LET   Imaging outcome: foreign body not noted     Wound exploration: wound explored through full range of motion and entire depth of wound visualized     Wound extent: no foreign bodies/material noted and no underlying fracture noted     Contaminated: no   Treatment:    Area cleansed with:  Saline   Amount of cleaning:  Standard   Irrigation solution:  Sterile saline Skin repair:    Repair method:  Tissue adhesive Approximation:    Approximation:  Close Repair type:    Repair type:  Simple Post-procedure details:    Dressing:  Open (no dressing)   Procedure  completion:  Tolerated well, no immediate complications     Medications Ordered in ED Medications  lidocaine-EPINEPHrine-tetracaine (LET) topical gel (3 mLs Topical Given 02/08/21 1911)    ED Course  I have reviewed the triage vital signs and the nursing notes.  Pertinent labs & imaging results that were available during my care of the patient were reviewed by me and considered in my medical decision making (see chart for details).  Clinical Course as of 02/08/21 2036  Mon Feb 08, 5355  5527 70 year old female presents after hitting her head on a table tonight resulting in laceration to the right side of her head.  Reports pain in her head as well as neck.  On exam is found to have a 2.5 cm linear laceration to the right forehead which is not bleeding currently.  Also has posterior neck pain.  CT head and C-spine negative for acute injury.  Wound was cleaned and closed with Dermabond.  Patient is advised to recheck with her doctor as needed, return to the ED for severe concerning symptoms. [LM]    Clinical Course User Index [LM] Roque Lias   MDM Rules/Calculators/A&P  Final Clinical Impression(s) / ED Diagnoses Final diagnoses:  Facial laceration, initial encounter  Acute strain of neck muscle, initial encounter    Rx / DC Orders ED Discharge Orders    None       Roque Lias 02/08/21 2036    Dorie Rank, MD 02/09/21 [phone]

## 2021-02-08 NOTE — ED Triage Notes (Signed)
Pt tripped and fell over dog, hit rt side of head on table and has 1inch lac on rt side of head. Bleeding controlled, denies loc and denies anticoags. C/o minor neck pain

## 2021-02-10 ENCOUNTER — Other Ambulatory Visit: Payer: Self-pay | Admitting: Obstetrics and Gynecology

## 2021-02-10 DIAGNOSIS — R928 Other abnormal and inconclusive findings on diagnostic imaging of breast: Secondary | ICD-10-CM

## 2021-02-17 ENCOUNTER — Ambulatory Visit: Payer: Medicare PPO

## 2021-02-24 ENCOUNTER — Other Ambulatory Visit: Payer: Self-pay

## 2021-02-24 ENCOUNTER — Ambulatory Visit
Admission: RE | Admit: 2021-02-24 | Discharge: 2021-02-24 | Disposition: A | Discharge status: Home / Self Care | Payer: Medicare PPO | Source: Ambulatory Visit | Attending: Obstetrics and Gynecology | Admitting: Obstetrics and Gynecology

## 2021-02-24 ENCOUNTER — Other Ambulatory Visit: Payer: Self-pay | Admitting: Obstetrics and Gynecology

## 2021-02-24 DIAGNOSIS — R928 Other abnormal and inconclusive findings on diagnostic imaging of breast: Secondary | ICD-10-CM

## 2021-02-24 DIAGNOSIS — N6001 Solitary cyst of right breast: Secondary | ICD-10-CM | POA: Diagnosis not present

## 2021-02-24 DIAGNOSIS — R922 Inconclusive mammogram: Secondary | ICD-10-CM | POA: Diagnosis not present

## 2021-03-15 DIAGNOSIS — H353132 Nonexudative age-related macular degeneration, bilateral, intermediate dry stage: Secondary | ICD-10-CM | POA: Diagnosis not present

## 2021-03-15 DIAGNOSIS — H2513 Age-related nuclear cataract, bilateral: Secondary | ICD-10-CM | POA: Diagnosis not present

## 2021-03-15 DIAGNOSIS — H52203 Unspecified astigmatism, bilateral: Secondary | ICD-10-CM | POA: Diagnosis not present

## 2021-03-23 DIAGNOSIS — N83201 Unspecified ovarian cyst, right side: Secondary | ICD-10-CM | POA: Diagnosis not present

## 2021-04-05 DIAGNOSIS — N1831 Chronic kidney disease, stage 3a: Secondary | ICD-10-CM | POA: Diagnosis not present

## 2021-04-05 DIAGNOSIS — E559 Vitamin D deficiency, unspecified: Secondary | ICD-10-CM | POA: Diagnosis not present

## 2021-04-05 DIAGNOSIS — I10 Essential (primary) hypertension: Secondary | ICD-10-CM | POA: Diagnosis not present

## 2021-04-05 DIAGNOSIS — I700 Atherosclerosis of aorta: Secondary | ICD-10-CM | POA: Diagnosis not present

## 2021-04-05 DIAGNOSIS — K5090 Crohn's disease, unspecified, without complications: Secondary | ICD-10-CM | POA: Diagnosis not present

## 2021-04-05 DIAGNOSIS — E1129 Type 2 diabetes mellitus with other diabetic kidney complication: Secondary | ICD-10-CM | POA: Diagnosis not present

## 2021-04-05 DIAGNOSIS — K760 Fatty (change of) liver, not elsewhere classified: Secondary | ICD-10-CM | POA: Diagnosis not present

## 2021-04-05 DIAGNOSIS — N6001 Solitary cyst of right breast: Secondary | ICD-10-CM | POA: Diagnosis not present

## 2021-04-13 DIAGNOSIS — L281 Prurigo nodularis: Secondary | ICD-10-CM | POA: Diagnosis not present

## 2021-04-13 DIAGNOSIS — L568 Other specified acute skin changes due to ultraviolet radiation: Secondary | ICD-10-CM | POA: Diagnosis not present

## 2021-04-13 DIAGNOSIS — D485 Neoplasm of uncertain behavior of skin: Secondary | ICD-10-CM | POA: Diagnosis not present

## 2021-04-13 DIAGNOSIS — L820 Inflamed seborrheic keratosis: Secondary | ICD-10-CM | POA: Diagnosis not present

## 2021-04-13 DIAGNOSIS — L578 Other skin changes due to chronic exposure to nonionizing radiation: Secondary | ICD-10-CM | POA: Diagnosis not present

## 2021-04-13 DIAGNOSIS — Z85828 Personal history of other malignant neoplasm of skin: Secondary | ICD-10-CM | POA: Diagnosis not present

## 2021-04-13 DIAGNOSIS — Z86006 Personal history of melanoma in-situ: Secondary | ICD-10-CM | POA: Diagnosis not present

## 2021-04-13 DIAGNOSIS — Z08 Encounter for follow-up examination after completed treatment for malignant neoplasm: Secondary | ICD-10-CM | POA: Diagnosis not present

## 2021-04-13 DIAGNOSIS — C44629 Squamous cell carcinoma of skin of left upper limb, including shoulder: Secondary | ICD-10-CM | POA: Diagnosis not present

## 2021-04-13 DIAGNOSIS — Z09 Encounter for follow-up examination after completed treatment for conditions other than malignant neoplasm: Secondary | ICD-10-CM | POA: Diagnosis not present

## 2021-04-13 DIAGNOSIS — L298 Other pruritus: Secondary | ICD-10-CM | POA: Diagnosis not present

## 2021-04-30 ENCOUNTER — Ambulatory Visit (HOSPITAL_COMMUNITY)
Admission: RE | Admit: 2021-04-30 | Discharge: 2021-04-30 | Disposition: A | Discharge status: Home / Self Care | Payer: Medicare PPO | Source: Ambulatory Visit | Attending: Internal Medicine | Admitting: Internal Medicine

## 2021-04-30 ENCOUNTER — Other Ambulatory Visit (HOSPITAL_COMMUNITY): Payer: Self-pay | Admitting: Internal Medicine

## 2021-04-30 ENCOUNTER — Other Ambulatory Visit: Payer: Self-pay

## 2021-04-30 DIAGNOSIS — I700 Atherosclerosis of aorta: Secondary | ICD-10-CM | POA: Insufficient documentation

## 2021-04-30 DIAGNOSIS — K760 Fatty (change of) liver, not elsewhere classified: Secondary | ICD-10-CM | POA: Diagnosis not present

## 2021-04-30 DIAGNOSIS — I129 Hypertensive chronic kidney disease with stage 1 through stage 4 chronic kidney disease, or unspecified chronic kidney disease: Secondary | ICD-10-CM | POA: Diagnosis not present

## 2021-04-30 DIAGNOSIS — R197 Diarrhea, unspecified: Secondary | ICD-10-CM | POA: Diagnosis not present

## 2021-04-30 DIAGNOSIS — K3189 Other diseases of stomach and duodenum: Secondary | ICD-10-CM | POA: Diagnosis not present

## 2021-04-30 DIAGNOSIS — K6389 Other specified diseases of intestine: Secondary | ICD-10-CM | POA: Diagnosis not present

## 2021-04-30 DIAGNOSIS — N200 Calculus of kidney: Secondary | ICD-10-CM | POA: Diagnosis not present

## 2021-04-30 DIAGNOSIS — K529 Noninfective gastroenteritis and colitis, unspecified: Secondary | ICD-10-CM | POA: Diagnosis not present

## 2021-04-30 DIAGNOSIS — N1831 Chronic kidney disease, stage 3a: Secondary | ICD-10-CM | POA: Diagnosis not present

## 2021-04-30 DIAGNOSIS — K5090 Crohn's disease, unspecified, without complications: Secondary | ICD-10-CM | POA: Diagnosis not present

## 2021-04-30 DIAGNOSIS — D72829 Elevated white blood cell count, unspecified: Secondary | ICD-10-CM | POA: Diagnosis not present

## 2021-04-30 DIAGNOSIS — N179 Acute kidney failure, unspecified: Secondary | ICD-10-CM | POA: Diagnosis not present

## 2021-04-30 DIAGNOSIS — Z1152 Encounter for screening for COVID-19: Secondary | ICD-10-CM | POA: Diagnosis not present

## 2021-04-30 MED ORDER — IOHEXOL 9 MG/ML PO SOLN
ORAL | Status: AC
  Administered 2021-04-30: 1000 mL
  Filled 2021-04-30: qty 1000

## 2021-05-03 ENCOUNTER — Encounter (HOSPITAL_COMMUNITY): Payer: Self-pay

## 2021-05-03 ENCOUNTER — Inpatient Hospital Stay (HOSPITAL_COMMUNITY)
Admission: EM | Admit: 2021-05-03 | Discharge: 2021-05-05 | DRG: 392 | Disposition: A | Discharge status: Home / Self Care | Payer: Medicare PPO | Source: Ambulatory Visit | Attending: Internal Medicine | Admitting: Internal Medicine

## 2021-05-03 ENCOUNTER — Other Ambulatory Visit: Payer: Self-pay

## 2021-05-03 ENCOUNTER — Emergency Department (HOSPITAL_COMMUNITY): Payer: Medicare PPO

## 2021-05-03 DIAGNOSIS — N2889 Other specified disorders of kidney and ureter: Secondary | ICD-10-CM | POA: Diagnosis not present

## 2021-05-03 DIAGNOSIS — E785 Hyperlipidemia, unspecified: Secondary | ICD-10-CM | POA: Diagnosis present

## 2021-05-03 DIAGNOSIS — Z87892 Personal history of anaphylaxis: Secondary | ICD-10-CM

## 2021-05-03 DIAGNOSIS — Z87442 Personal history of urinary calculi: Secondary | ICD-10-CM | POA: Diagnosis not present

## 2021-05-03 DIAGNOSIS — K5000 Crohn's disease of small intestine without complications: Secondary | ICD-10-CM | POA: Diagnosis present

## 2021-05-03 DIAGNOSIS — N1831 Chronic kidney disease, stage 3a: Secondary | ICD-10-CM | POA: Diagnosis not present

## 2021-05-03 DIAGNOSIS — Z20822 Contact with and (suspected) exposure to covid-19: Secondary | ICD-10-CM | POA: Diagnosis present

## 2021-05-03 DIAGNOSIS — Z9049 Acquired absence of other specified parts of digestive tract: Secondary | ICD-10-CM

## 2021-05-03 DIAGNOSIS — Z981 Arthrodesis status: Secondary | ICD-10-CM | POA: Diagnosis not present

## 2021-05-03 DIAGNOSIS — R933 Abnormal findings on diagnostic imaging of other parts of digestive tract: Secondary | ICD-10-CM | POA: Diagnosis not present

## 2021-05-03 DIAGNOSIS — Z79899 Other long term (current) drug therapy: Secondary | ICD-10-CM

## 2021-05-03 DIAGNOSIS — K5090 Crohn's disease, unspecified, without complications: Secondary | ICD-10-CM | POA: Diagnosis not present

## 2021-05-03 DIAGNOSIS — A09 Infectious gastroenteritis and colitis, unspecified: Secondary | ICD-10-CM | POA: Diagnosis present

## 2021-05-03 DIAGNOSIS — E119 Type 2 diabetes mellitus without complications: Secondary | ICD-10-CM

## 2021-05-03 DIAGNOSIS — R197 Diarrhea, unspecified: Secondary | ICD-10-CM | POA: Diagnosis not present

## 2021-05-03 DIAGNOSIS — I10 Essential (primary) hypertension: Secondary | ICD-10-CM | POA: Diagnosis present

## 2021-05-03 DIAGNOSIS — E871 Hypo-osmolality and hyponatremia: Secondary | ICD-10-CM | POA: Diagnosis present

## 2021-05-03 DIAGNOSIS — I129 Hypertensive chronic kidney disease with stage 1 through stage 4 chronic kidney disease, or unspecified chronic kidney disease: Secondary | ICD-10-CM | POA: Diagnosis not present

## 2021-05-03 DIAGNOSIS — M47819 Spondylosis without myelopathy or radiculopathy, site unspecified: Secondary | ICD-10-CM | POA: Diagnosis not present

## 2021-05-03 DIAGNOSIS — K529 Noninfective gastroenteritis and colitis, unspecified: Secondary | ICD-10-CM

## 2021-05-03 DIAGNOSIS — K760 Fatty (change of) liver, not elsewhere classified: Secondary | ICD-10-CM | POA: Diagnosis present

## 2021-05-03 DIAGNOSIS — M1990 Unspecified osteoarthritis, unspecified site: Secondary | ICD-10-CM | POA: Diagnosis present

## 2021-05-03 DIAGNOSIS — N179 Acute kidney failure, unspecified: Secondary | ICD-10-CM | POA: Diagnosis present

## 2021-05-03 DIAGNOSIS — F32A Depression, unspecified: Secondary | ICD-10-CM | POA: Diagnosis present

## 2021-05-03 DIAGNOSIS — Z7952 Long term (current) use of systemic steroids: Secondary | ICD-10-CM

## 2021-05-03 DIAGNOSIS — Z886 Allergy status to analgesic agent status: Secondary | ICD-10-CM

## 2021-05-03 DIAGNOSIS — Z833 Family history of diabetes mellitus: Secondary | ICD-10-CM

## 2021-05-03 DIAGNOSIS — K7581 Nonalcoholic steatohepatitis (NASH): Secondary | ICD-10-CM | POA: Diagnosis present

## 2021-05-03 DIAGNOSIS — Z880 Allergy status to penicillin: Secondary | ICD-10-CM

## 2021-05-03 DIAGNOSIS — E876 Hypokalemia: Secondary | ICD-10-CM | POA: Diagnosis present

## 2021-05-03 DIAGNOSIS — Z85820 Personal history of malignant melanoma of skin: Secondary | ICD-10-CM | POA: Diagnosis not present

## 2021-05-03 DIAGNOSIS — Z8249 Family history of ischemic heart disease and other diseases of the circulatory system: Secondary | ICD-10-CM

## 2021-05-03 DIAGNOSIS — K6389 Other specified diseases of intestine: Secondary | ICD-10-CM | POA: Diagnosis not present

## 2021-05-03 DIAGNOSIS — Z888 Allergy status to other drugs, medicaments and biological substances status: Secondary | ICD-10-CM

## 2021-05-03 DIAGNOSIS — E860 Dehydration: Secondary | ICD-10-CM | POA: Diagnosis present

## 2021-05-03 DIAGNOSIS — D72829 Elevated white blood cell count, unspecified: Secondary | ICD-10-CM | POA: Diagnosis not present

## 2021-05-03 LAB — COMPREHENSIVE METABOLIC PANEL
ALT: 19 U/L (ref 0–44)
AST: 18 U/L (ref 15–41)
Albumin: 4.3 g/dL (ref 3.5–5.0)
Alkaline Phosphatase: 73 U/L (ref 38–126)
Anion gap: 14 (ref 5–15)
BUN: 42 mg/dL — ABNORMAL HIGH (ref 8–23)
CO2: 21 mmol/L — ABNORMAL LOW (ref 22–32)
Calcium: 9.2 mg/dL (ref 8.9–10.3)
Chloride: 98 mmol/L (ref 98–111)
Creatinine, Ser: 2.38 mg/dL — ABNORMAL HIGH (ref 0.44–1.00)
GFR, Estimated: 21 mL/min — ABNORMAL LOW (ref >60)
Glucose, Bld: 182 mg/dL — ABNORMAL HIGH (ref 70–99)
Potassium: 2.5 mmol/L — CL (ref 3.5–5.1)
Sodium: 133 mmol/L — ABNORMAL LOW (ref 135–145)
Total Bilirubin: 0.4 mg/dL (ref 0.3–1.2)
Total Protein: 8.4 g/dL — ABNORMAL HIGH (ref 6.5–8.1)

## 2021-05-03 LAB — C-REACTIVE PROTEIN: CRP: 0.6 mg/dL (ref <1.0)

## 2021-05-03 LAB — CBC
HCT: 45.8 % (ref 36.0–46.0)
Hemoglobin: 15.4 g/dL — ABNORMAL HIGH (ref 12.0–15.0)
MCH: 29.3 pg (ref 26.0–34.0)
MCHC: 33.6 g/dL (ref 30.0–36.0)
MCV: 87.2 fL (ref 80.0–100.0)
Platelets: 423 10*3/uL — ABNORMAL HIGH (ref 150–400)
RBC: 5.25 MIL/uL — ABNORMAL HIGH (ref 3.87–5.11)
RDW: 14 % (ref 11.5–15.5)
WBC: 18.5 10*3/uL — ABNORMAL HIGH (ref 4.0–10.5)
nRBC: 0 % (ref 0.0–0.2)

## 2021-05-03 LAB — URINALYSIS, ROUTINE W REFLEX MICROSCOPIC
Bilirubin Urine: NEGATIVE
Glucose, UA: NEGATIVE mg/dL
Hgb urine dipstick: NEGATIVE
Ketones, ur: NEGATIVE mg/dL
Nitrite: NEGATIVE
Protein, ur: 30 mg/dL — AB
Specific Gravity, Urine: 1.019 (ref 1.005–1.030)
pH: 6 (ref 5.0–8.0)

## 2021-05-03 LAB — RESP PANEL BY RT-PCR (FLU A&B, COVID) ARPGX2
Influenza A by PCR: NEGATIVE
Influenza B by PCR: NEGATIVE
SARS Coronavirus 2 by RT PCR: NEGATIVE

## 2021-05-03 LAB — GLUCOSE, CAPILLARY
Glucose-Capillary: 99 mg/dL (ref 70–99)
Glucose-Capillary: 97 mg/dL (ref 70–99)

## 2021-05-03 LAB — LIPASE, BLOOD: Lipase: 57 U/L — ABNORMAL HIGH (ref 11–51)

## 2021-05-03 LAB — MAGNESIUM: Magnesium: 1.8 mg/dL (ref 1.7–2.4)

## 2021-05-03 MED ORDER — SODIUM CHLORIDE 0.9% FLUSH
3.0000 mL | Freq: Two times a day (BID) | INTRAVENOUS | Status: DC
  Administered 2021-05-05: 3 mL via INTRAVENOUS

## 2021-05-03 MED ORDER — POTASSIUM CHLORIDE CRYS ER 20 MEQ PO TBCR
40.0000 meq | EXTENDED_RELEASE_TABLET | Freq: Once | ORAL | Status: DC
  Filled 2021-05-03: qty 2

## 2021-05-03 MED ORDER — POTASSIUM CHLORIDE 10 MEQ/100ML IV SOLN
10.0000 meq | INTRAVENOUS | Status: AC
  Administered 2021-05-03 (×4): 10 meq via INTRAVENOUS
  Filled 2021-05-03 (×4): qty 100

## 2021-05-03 MED ORDER — LACTATED RINGERS IV SOLN: INTRAVENOUS | Status: DC

## 2021-05-03 MED ORDER — METOPROLOL SUCCINATE ER 50 MG PO TB24
50.0000 mg | ORAL_TABLET | Freq: Two times a day (BID) | ORAL | Status: DC
  Administered 2021-05-03 – 2021-05-05 (×4): 50 mg via ORAL
  Filled 2021-05-03 (×4): qty 1

## 2021-05-03 MED ORDER — PANTOPRAZOLE SODIUM 40 MG PO TBEC: 40.0000 mg | DELAYED_RELEASE_TABLET | Freq: Every day | ORAL | Status: DC | PRN

## 2021-05-03 MED ORDER — HEPARIN SODIUM (PORCINE) 5000 UNIT/ML IJ SOLN
5000.0000 [IU] | Freq: Three times a day (TID) | INTRAMUSCULAR | Status: DC
  Administered 2021-05-03 – 2021-05-05 (×3): 5000 [IU] via SUBCUTANEOUS
  Filled 2021-05-03 (×4): qty 1

## 2021-05-03 MED ORDER — CIPROFLOXACIN IN D5W 400 MG/200ML IV SOLN: 400.0000 mg | INTRAVENOUS | Status: DC

## 2021-05-03 MED ORDER — ACETAMINOPHEN 325 MG PO TABS: 650.0000 mg | ORAL_TABLET | Freq: Four times a day (QID) | ORAL | Status: DC | PRN

## 2021-05-03 MED ORDER — CIPROFLOXACIN IN D5W 400 MG/200ML IV SOLN
400.0000 mg | INTRAVENOUS | Status: DC
  Administered 2021-05-03 – 2021-05-04 (×2): 400 mg via INTRAVENOUS
  Filled 2021-05-03 (×2): qty 200

## 2021-05-03 MED ORDER — ACETAMINOPHEN 650 MG RE SUPP: 650.0000 mg | Freq: Four times a day (QID) | RECTAL | Status: DC | PRN

## 2021-05-03 MED ORDER — BUDESONIDE 3 MG PO CPEP: 3.0000 mg | ORAL_CAPSULE | Freq: Every morning | ORAL | Status: DC

## 2021-05-03 MED ORDER — ONDANSETRON HCL 4 MG PO TABS: 4.0000 mg | ORAL_TABLET | Freq: Four times a day (QID) | ORAL | Status: DC | PRN

## 2021-05-03 MED ORDER — INSULIN ASPART 100 UNIT/ML IJ SOLN
0.0000 [IU] | Freq: Three times a day (TID) | INTRAMUSCULAR | Status: DC
  Administered 2021-05-04 – 2021-05-05 (×2): 1 [IU] via SUBCUTANEOUS

## 2021-05-03 MED ORDER — AMLODIPINE BESYLATE 5 MG PO TABS
5.0000 mg | ORAL_TABLET | Freq: Every day | ORAL | Status: DC
  Administered 2021-05-05: 5 mg via ORAL
  Filled 2021-05-03 (×2): qty 1

## 2021-05-03 MED ORDER — SODIUM CHLORIDE 0.9 % IV SOLN: Freq: Once | INTRAVENOUS | Status: AC

## 2021-05-03 MED ORDER — FENOFIBRATE 54 MG PO TABS: 54.0000 mg | ORAL_TABLET | Freq: Every day | ORAL | Status: DC

## 2021-05-03 MED ORDER — POLYETHYLENE GLYCOL 3350 17 G PO PACK: 17.0000 g | PACK | Freq: Every day | ORAL | Status: DC | PRN

## 2021-05-03 MED ORDER — METRONIDAZOLE 500 MG/100ML IV SOLN
500.0000 mg | Freq: Three times a day (TID) | INTRAVENOUS | Status: DC
  Administered 2021-05-03: 500 mg via INTRAVENOUS
  Filled 2021-05-03: qty 100

## 2021-05-03 MED ORDER — SODIUM CHLORIDE 0.9 % IV BOLUS: 1000.0000 mL | Freq: Once | INTRAVENOUS | Status: DC

## 2021-05-03 MED ORDER — ONDANSETRON HCL 4 MG/2ML IJ SOLN: 4.0000 mg | Freq: Four times a day (QID) | INTRAMUSCULAR | Status: DC | PRN

## 2021-05-03 MED ORDER — ONDANSETRON HCL 4 MG/2ML IJ SOLN
4.0000 mg | Freq: Once | INTRAMUSCULAR | Status: AC
  Administered 2021-05-03: 4 mg via INTRAVENOUS
  Filled 2021-05-03: qty 2

## 2021-05-03 MED ORDER — METRONIDAZOLE 500 MG/100ML IV SOLN
500.0000 mg | Freq: Three times a day (TID) | INTRAVENOUS | Status: DC
  Administered 2021-05-03 – 2021-05-05 (×6): 500 mg via INTRAVENOUS
  Filled 2021-05-03 (×5): qty 100

## 2021-05-03 NOTE — ED Notes (Signed)
RN notified of abnormal lab 

## 2021-05-03 NOTE — Consult Note (Signed)
Referring Provider: Dr Ronnald Nian Primary Care Physician:  [name], MD Primary Gastroenterologist:  Dr. Cristina Gong [facility])  Reason for Consultation:  Colitis on CT imaging  HPI: Alexis Ramos is a 70 y.o. female with past medical history of Crohn's ileitis presenting for consultation of colitis on CT imaging.  Patient has a 1 week history of increased stool frequency.  She states she has been having approximately 10 stools per day, which is increased from her baseline of 5 stools per day (she does have chronic diarrhea).  She denies any rectal bleeding or melena.  She further denies abdominal pain.  She has also had a low-grade fever (100.53F) but denies any chills.  She was started on Cipro/Flagyl by PCP on Friday, but symptoms have not improved.  Denies recent antibiotic use prior to Cipro/Flagyl. Denies sick contacts or travel outside of the country.  No blood thinner, aspirin, or NSAID use.  CT today (without contrast): Diarrheal state with probable mild colitis. Correlation with clinical exam and stool cultures recommended. No bowel obstruction.  09/2015 Colonoscopy: Ileocolonic anastomosis with erythema, friable mucosa, and ulceration; anal stricture/stenosis; 59m hyperplastic polyp in transverse colon  Past Medical History:  Diagnosis Date  . Arthritis   . Cancer ([place]    melanoma on back   . Crohn's disease ([place]   . Depression   . DES exposure in utero   . Diabetes ([place]    type 2  . Forgetfulness   . Hyperlipidemia   . Hypertension   . Kidney stones   . Menopausal symptoms   . [place]   tx w/steroids  . Snoring     Past Surgical History:  Procedure Laterality Date  . Anal abscess surg    . anal sphincterectomy    . APPENDECTOMY  1970  . [place]  small, x 3  . BREAST BIOPSY Left   . BREAST SURGERY     Biopsy-Benign  . CERVICAL FUSION  05/2009   C4-C7  . CHOLECYSTECTOMY N/A  08/10/2016   Procedure: LAPAROSCOPIC CHOLECYSTECTOMY WITH INTRAOPERATIVE CHOLANGIOGRAM -STANDARD 4 PORT;  Surgeon: SMichael Boston MD;  Location: [facility];  Service: General;  Laterality: N/A;  . [place] C[place]TERUS  1984  . HAND SURGERY    . ILEOCECETOMY  1971   Crohns disease flare  . [place]  x2  . KNEE SURGERY Left 1998  . LAPAROSCOPIC LYSIS OF ADHESIONS N/A 08/10/2016   Procedure: LAPAROSCOPIC LYSIS OF ADHESIONS WITH CORE LIVER BIOPSY;  Surgeon: SMichael Boston MD;  Location: [facility];  Service: General;  Laterality: N/A;  . LIPOSUCTION  2004   neck  . NASAL SINUS SURGERY  1995  . SKIN CANCER EXCISION  03/2016   melanoma, back  . SMALL INTESTINE SURGERY  1985   Crohn stricture  . SMALL INTESTINE SURGERY  1996   Crohn stricture  . Thumb surg    . TONGUE BASE REDUCTION SOMNOPLASTY  2000   Removed uvula  . TONSILLECTOMY AND ADENOIDECTOMY  1959  . WISDOM TOOTH EXTRACTION  1974    Prior to Admission medications   Medication Sig Start Date End Date Taking? Authorizing Provider  ciprofloxacin (CIPRO) 500 MG tablet Take 500 mg by mouth daily. 05/01/21  Yes [provider]  cyanocobalamin (,VITAMIN B-12,) 1000 MCG/ML injection Inject 1,000 mcg into the muscle every 21 ( twenty-one) days. 02/19/21  Yes [provider]  metoprolol succinate (  TOPROL-XL) 100 MG 24 hr tablet Take 50 mg by mouth 2 (two) times daily.   Yes [provider]  metroNIDAZOLE (FLAGYL) 250 MG tablet Take 250 mg by mouth 3 (three) times daily. Start date : 05/01/21 05/01/21  Yes [provider]  MITIGARE 0.6 MG CAPS Take 0.6 capsules by mouth daily as needed (gout). 04/08/21  Yes [provider]  omeprazole (PRILOSEC) 20 MG capsule Take 20 mg by mouth daily as needed (heartburn). 05/11/19  Yes [provider]  triamcinolone (NASACORT) 55 MCG/ACT AERO nasal inhaler Place 1 spray into the nose daily as needed (allergy).   Yes [provider]   venlafaxine (EFFEXOR) 75 MG tablet Take 150-225 mg by mouth See admin instructions. Takes 225 mg in the morning and 150 mg at bedtime 05/16/16  Yes [provider]  amLODipine (NORVASC) 5 MG tablet Take 5 mg by mouth daily. 02/28/16   [provider]  budesonide (ENTOCORT EC) 3 MG 24 hr capsule Take 3 mg by mouth every morning. 03/10/21   [provider]  Cholecalciferol (VITAMIN D-3) 5000 units TABS Take 5,000 Units by mouth daily.    [provider]  fenofibrate 54 MG tablet Take 54 mg by mouth daily. 02/10/21   [provider]  losartan (COZAAR) 100 MG tablet Take 100 mg by mouth daily. 05/14/19   [provider]  Multiple Vitamins-Minerals (PRESERVISION AREDS) CAPS Take 1 capsule by mouth daily.    [provider]  pioglitazone (ACTOS) 15 MG tablet Take 15 mg by mouth daily. 04/05/21   [provider]  Vitamin E 400 units TABS Take 800 Units by mouth daily.    [provider]    Scheduled Meds: Continuous Infusions: . potassium chloride 10 mEq (05/03/21 1557)  . sodium chloride     PRN Meds:.  Allergies as of 05/03/2021 - Review Complete 05/03/2021  Allergen Reaction Noted  . Nsaids Other (See Comments) 08/10/2016  . Penicillins Anaphylaxis 05/10/2012  . Advil [ibuprofen]  05/10/2012  . Asa [aspirin]  05/10/2012  . Daypro [oxaprozin]  05/10/2012    Family History  Problem Relation Age of Onset  . Diabetes Mother   . Hypertension Mother   . Osteoporosis Mother   . Hypertension Father   . Heart disease Father   . Prostate cancer Father   . Diabetes Brother   . Hypertension Brother   . Cancer Other        Niece-Glioblastoma  . Breast cancer Neg Hx     Social History   Socioeconomic History  . Marital status: Single    Spouse name: Not on file  . Number of children: 0  . Years of education: Not on file  . 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 Smoking status: Never Smoker  . Smokeless tobacco: Never Used  Vaping Use  . Vaping Use: Never used  Substance and Sexual Activity  . Alcohol use: No  . Drug use: No  . Sexual activity: Never    Birth control/protection: Post-menopausal  Other Topics Concern  . Not on file  Social History Narrative   Lives alone   Caffeine- 1 daily, 16 oz   Social Determinants of Health   Financial Resource Strain: Not on file  Food Insecurity: Not on file  Transportation Needs: Not on  file  Physical Activity: Not on file  Stress: Not on file  Social Connections: Not on file  Intimate Partner Violence: Not on file    Review of Systems: Review of Systems  Constitutional: Positive for fever. Negative for chills.  HENT: Negative for hearing loss and tinnitus.   Eyes: Negative for pain and redness.  Respiratory: Negative for cough and shortness of breath.   Cardiovascular: Negative for chest pain and palpitations.  Gastrointestinal: Positive for diarrhea. Negative for abdominal pain ( ), blood in stool, constipation, heartburn, melena, nausea and vomiting.  Genitourinary: Negative for flank pain and hematuria.  Musculoskeletal: Negative for falls and joint pain.  Skin: Negative for itching and rash.  Neurological: Negative for seizures and loss of consciousness.  Endo/Heme/Allergies: Negative for polydipsia. Does not bruise/bleed easily.  Psychiatric/Behavioral: Negative for substance abuse. The patient is not nervous/anxious.     Physical Exam: As per Dr. Cristina Gong  Vital signs: Vitals:   05/03/21 1615 05/03/21 1630  BP: (!) 149/76   Pulse: 73 75  Resp: 16 13  Temp:    SpO2: 99% 99%     GI:  Lab Results: Recent Labs    05/03/21 1132  WBC 18.5*  HGB 15.4*  HCT 45.8  PLT 423*   BMET Recent Labs    05/03/21 1132  NA 133*  K 2.5*  CL 98  CO2 21*  GLUCOSE 182*  BUN 42*   CREATININE 2.38*  CALCIUM 9.2   LFT Recent Labs    05/03/21 1132  PROT 8.4*  ALBUMIN 4.3  AST 18  ALT 19  ALKPHOS 73  BILITOT 0.4   PT/INR No results for input(s): LABPROT, INR in the last 72 hours.   Studies/Results: CT ABDOMEN PELVIS WO CONTRAST  Result Date: 05/03/2021 CLINICAL DATA:  70 year old female with leukocytosis. Concern for colitis. EXAM: CT ABDOMEN AND PELVIS WITHOUT CONTRAST TECHNIQUE: Multidetector CT imaging of the abdomen and pelvis was performed following the standard protocol without IV contrast. COMPARISON:  CT abdomen pelvis dated 04/30/2021. FINDINGS: Evaluation of this exam is limited in the absence of intravenous contrast. Lower chest: The visualized lung bases are clear. There is coronary vascular calcification. No intra-abdominal free air or free fluid. Hepatobiliary: Diffuse fatty liver. No intrahepatic biliary dilatation. Cholecystectomy. No retained calcified stone noted in the central CBD. Pancreas: Unremarkable. No pancreatic ductal dilatation or surrounding inflammatory changes. Spleen: Normal in size without focal abnormality. Adrenals/Urinary Tract: The adrenal glands are unremarkable. There is no hydronephrosis on either side. Vascular calcification versus punctate nonobstructing left renal upper pole calculus. Small focus of parenchymal calcification noted in the interpolar right kidney as well as in the inferior pole of the right kidney. Indeterminate small hypodense lesions from the left kidney measure up to 15 mm, suboptimally characterized on this noncontrast CT but likely cysts. Ultrasound may provide better characterization on a nonemergent/outpatient basis if clinically indicated. The visualized ureters appear unremarkable. The urinary bladder is partially distended and grossly unremarkable. Stomach/Bowel: There is loose stool throughout the colon compatible with diarrheal state. There is mild thickening of the colonic wall without significant  pericolonic stranding which may represent mild residual colitis. There is no bowel obstruction. The appendix is not visualized with certainty. No inflammatory changes identified in the right lower quadrant. Vascular/Lymphatic: Mild aortoiliac atherosclerotic disease. The IVC is unremarkable. No portal venous gas. There is no adenopathy. Reproductive: The uterus is anteverted. There is a 3.6 cm right adnexal cyst as seen previously. Recommend follow-up US in 6-12 months.  Note: This recommendation does not apply to premenarchal patients and to those with increased risk (genetic, family history, elevated tumor markers or other high-risk factors) of ovarian cancer. Reference: JACR 2020 Feb; 17(2):248-254 Other: Postsurgical changes of anterior abdominal wall. Musculoskeletal: Degenerative changes of the spine. No acute osseous pathology. IMPRESSION: 1. Diarrheal state with probable mild colitis. Correlation with clinical exam and stool cultures recommended. No bowel obstruction. 2. Fatty liver. 3. Aortic Atherosclerosis (ICD10-I70.0). Electronically Signed   By: Anner Crete M.D.   On: 05/03/2021 15:23    Impression: Colitis on CT imaging:  Possible infectious etiology, though Crohn's colitis also possible but less likely, as patient has history of Crohn's ileitis only. -WBCs 18.5 K/uL -Hgb 15.4, likely due to hemoconcentration in the setting of dehydration  AKI: BUN 42/ Cr 2.38  Hypokalemia: K+ 2.5  Plan: Discussed with Dr. Cristina Gong; proceed with un-prepped colonoscopy tomorrow for further evaluation.  OK to continue current outpatient Budesonide.  Await C. Diff and GI pathogen panel.  Clear liquid diet, NPO at midnight.  Continue IVF and supportive care.  [facility] will follow.   LOS: 0 days   Salley Slaughter  PA-C 05/03/2021, 4:32 PM  Contact #  [phone]

## 2021-05-03 NOTE — ED Provider Notes (Addendum)
[facility] DEPT Provider Note   CSN: [id number] Arrival date & time: 05/03/21  1112     History Chief Complaint  Patient presents with  . Abdominal Pain  . abnormal  labs    Alexis Ramos is a 70 y.o. female.  Patient here due to concern for dehydration.  Was diagnosed with colitis several days ago.  Has been on antibiotics.  Has not had much of an appetite.  The history is provided by the patient.  Abdominal Pain Pain location:  Generalized Pain quality: aching and bloating   Pain radiates to:  Does not radiate Pain severity:  Mild Onset quality:  Gradual Timing:  Intermittent Progression:  Waxing and waning Chronicity:  New Relieved by:  Nothing Worsened by:  Nothing Associated symptoms: diarrhea and nausea   Associated symptoms: no chest pain, no chills, no constipation, no cough, no dysuria, no fever, no hematuria, no shortness of breath, no sore throat and no vomiting        Past Medical History:  Diagnosis Date  . Arthritis   . Cancer ([place])    melanoma on back   . Crohn's disease ([place])   . Depression   . DES exposure in utero   . Diabetes ([place])    type 2  . Forgetfulness   . Hyperlipidemia   . Hypertension   . Kidney stones   . Menopausal symptoms   . SBO (small bowel obstruction) ([place]) 06/2011, 11/2018   tx w/steroids  . Snoring     Patient Active Problem List   Diagnosis Date Noted  . Crohn's disease involving terminal ileum ([place]) 12/07/2018  . Depression 12/07/2018  . Nonalcoholic steatohepatitis 55/37/4827  . Chronic cholecystitis without calculus s/p lap cholecystectomy 08/10/2016 08/10/2016  . Chronic kidney disease, stage III (moderate) ([place]) 03/27/2015  . Dyslipidemia 03/25/2015  . History of resection of small bowel 03/25/2015  . DES exposure in utero   . Hypertension   . Crohn's disease ([place])   . Menopausal symptoms     Past Surgical History:  Procedure Laterality Date  . Anal abscess surg    .  anal sphincterectomy    . APPENDECTOMY  1970  . [place]   small, x 3  . BREAST BIOPSY Left   . BREAST SURGERY     Biopsy-Benign  . CERVICAL FUSION  05/2009   C4-C7  . CHOLECYSTECTOMY N/A 08/10/2016   Procedure: LAPAROSCOPIC CHOLECYSTECTOMY WITH INTRAOPERATIVE CHOLANGIOGRAM -STANDARD 4 PORT;  Surgeon: [name], MD;  Location: [facility];  Service: General;  Laterality: N/A;  . [place] AND CURETTAGE OF UTERUS  1984  . HAND SURGERY    . ILEOCECETOMY  1971   Crohns disease flare  . [place]   x2  . KNEE SURGERY Left 1998  . LAPAROSCOPIC LYSIS OF ADHESIONS N/A 08/10/2016   Procedure: LAPAROSCOPIC LYSIS OF ADHESIONS WITH CORE LIVER BIOPSY;  Surgeon: [name], MD;  Location: [facility];  Service: General;  Laterality: N/A;  . LIPOSUCTION  2004   neck  . NASAL SINUS SURGERY  1995  . SKIN CANCER EXCISION  03/2016   melanoma, back  . SMALL INTESTINE SURGERY  1985   Crohn stricture  . SMALL INTESTINE SURGERY  1996   Crohn stricture  . Thumb surg    . TONGUE BASE REDUCTION SOMNOPLASTY  2000   Removed uvula  . TONSILLECTOMY AND ADENOIDECTOMY  1959  . Progress  OB History    Gravida  0   Para      Term      Preterm      AB      Living        SAB      IAB      Ectopic      Multiple      Live Births              Family History  Problem Relation Age of Onset  . Diabetes Mother   . Hypertension Mother   . Osteoporosis Mother   . Hypertension Father   . Heart disease Father   . Prostate cancer Father   . Diabetes Brother   . Hypertension Brother   . Cancer Other        Niece-Glioblastoma  . Breast cancer Neg Hx     Social History   Tobacco Use  . Smoking status: Never Smoker  . Smokeless tobacco: Never Used  Vaping Use  . Vaping Use: Never used  Substance Use Topics  . Alcohol use: No  . Drug use: No    Home Medications Prior to Admission medications   Medication Sig Start Date End  Date Taking? Authorizing Provider  ciprofloxacin (CIPRO) 500 MG tablet Take 500 mg by mouth daily. 05/01/21  Yes [provider]  cyanocobalamin (,VITAMIN B-12,) 1000 MCG/ML injection Inject 1,000 mcg into the muscle every 21 ( twenty-one) days. 02/19/21  Yes [provider]  metoprolol succinate (TOPROL-XL) 100 MG 24 hr tablet Take 50 mg by mouth 2 (two) times daily.   Yes [provider]  metroNIDAZOLE (FLAGYL) 250 MG tablet Take 250 mg by mouth 3 (three) times daily. Start date : 05/01/21 05/01/21  Yes [provider]  MITIGARE 0.6 MG CAPS Take 0.6 capsules by mouth daily as needed (gout). 04/08/21  Yes [provider]  omeprazole (PRILOSEC) 20 MG capsule Take 20 mg by mouth daily as needed (heartburn). 05/11/19  Yes [provider]  triamcinolone (NASACORT) 55 MCG/ACT AERO nasal inhaler Place 1 spray into the nose daily as needed (allergy).   Yes [provider]  venlafaxine (EFFEXOR) 75 MG tablet Take 150-225 mg by mouth See admin instructions. Takes 225 mg in the morning and 150 mg at bedtime 05/16/16  Yes [provider]  amLODipine (NORVASC) 5 MG tablet Take 5 mg by mouth daily. 02/28/16   [provider]  budesonide (ENTOCORT EC) 3 MG 24 hr capsule Take 3 mg by mouth every morning. 03/10/21   [provider]  Cholecalciferol (VITAMIN D-3) 5000 units TABS Take 5,000 Units by mouth daily.    [provider]  fenofibrate 54 MG tablet Take 54 mg by mouth daily. 02/10/21   [provider]  losartan (COZAAR) 100 MG tablet Take 100 mg by mouth daily. 05/14/19   [provider]  Multiple Vitamins-Minerals (PRESERVISION AREDS) CAPS Take 1 capsule by mouth daily.    [provider]  pioglitazone (ACTOS) 15 MG tablet Take 15 mg by mouth daily. 04/05/21   [provider]  Vitamin E 400 units TABS Take 800 Units by mouth daily.    [provider]    Allergies    Nsaids,  Penicillins, Advil [ibuprofen], Asa [aspirin], and Daypro [oxaprozin]  Review of Systems   Review of Systems  Constitutional: Negative for chills and fever.  HENT: Negative for ear pain and sore throat.   Eyes: Negative for pain and visual  disturbance.  Respiratory: Negative for cough and shortness of breath.   Cardiovascular: Negative for chest pain and palpitations.  Gastrointestinal: Positive for abdominal pain, diarrhea and nausea. Negative for constipation and vomiting.  Genitourinary: Negative for dysuria and hematuria.  Musculoskeletal: Negative for arthralgias and back pain.  Skin: Negative for color change and rash.  Neurological: Negative for seizures and syncope.  All other systems reviewed and are negative.   Physical Exam Updated Vital Signs BP (!) 142/80   Pulse 73   Temp 98.4 F (36.9 C) (Oral)   Resp (!) 21   Ht 4' 11"  (1.499 m)   Wt 49.9 kg   SpO2 97%   BMI 22.22 kg/m   Physical Exam Vitals and nursing note reviewed.  Constitutional:      General: She is not in acute distress.    Appearance: She is well-developed.  HENT:     Head: Normocephalic and atraumatic.     Mouth/Throat:     Comments: Dry mucous membranes  Eyes:     Conjunctiva/sclera: Conjunctivae normal.  Cardiovascular:     Rate and Rhythm: Normal rate and regular rhythm.     Heart sounds: No murmur heard.   Pulmonary:     Effort: Pulmonary effort is normal. No respiratory distress.     Breath sounds: Normal breath sounds.  Abdominal:     Palpations: Abdomen is soft.     Tenderness: There is generalized abdominal tenderness.  Musculoskeletal:     Cervical back: Neck supple.  Skin:    General: Skin is warm and dry.  Neurological:     General: No focal deficit present.     Mental Status: She is alert.  Psychiatric:        Mood and Affect: Mood normal.     ED Results / Procedures / Treatments   Labs (all labs ordered are listed, but only abnormal results are displayed) Labs  Reviewed  LIPASE, BLOOD - Abnormal; Notable for the following components:      Result Value   Lipase 57 (*)    All other components within normal limits  COMPREHENSIVE METABOLIC PANEL - Abnormal; Notable for the following components:   Sodium 133 (*)    Potassium 2.5 (*)    CO2 21 (*)    Glucose, Bld 182 (*)    BUN 42 (*)    Creatinine, Ser 2.38 (*)    Total Protein 8.4 (*)    GFR, Estimated 21 (*)    All other components within normal limits  CBC - Abnormal; Notable for the following components:   WBC 18.5 (*)    RBC 5.25 (*)    Hemoglobin 15.4 (*)    Platelets 423 (*)    All other components within normal limits  URINALYSIS, ROUTINE W REFLEX MICROSCOPIC - Abnormal; Notable for the following components:   Protein, ur 30 (*)    Leukocytes,Ua TRACE (*)    Bacteria, UA RARE (*)    All other components within normal limits  RESP PANEL BY RT-PCR (FLU A&B, COVID) ARPGX2  GASTROINTESTINAL PANEL BY PCR, STOOL (REPLACES STOOL CULTURE)  C DIFFICILE QUICK SCREEN W PCR REFLEX    EKG None  Radiology CT ABDOMEN PELVIS WO CONTRAST  Result Date: 05/03/2021 CLINICAL DATA:  70 year old female with leukocytosis. Concern for colitis. EXAM: CT ABDOMEN AND PELVIS WITHOUT CONTRAST TECHNIQUE: Multidetector CT imaging of the abdomen and pelvis was performed following the standard protocol without IV contrast. COMPARISON:  CT abdomen pelvis dated 04/30/2021. FINDINGS: Evaluation  of this exam is limited in the absence of intravenous contrast. Lower chest: The visualized lung bases are clear. There is coronary vascular calcification. No intra-abdominal free air or free fluid. Hepatobiliary: Diffuse fatty liver. No intrahepatic biliary dilatation. Cholecystectomy. No retained calcified stone noted in the central CBD. Pancreas: Unremarkable. No pancreatic ductal dilatation or surrounding inflammatory changes. Spleen: Normal in size without focal abnormality. Adrenals/Urinary Tract: The adrenal glands are  unremarkable. There is no hydronephrosis on either side. Vascular calcification versus punctate nonobstructing left renal upper pole calculus. Small focus of parenchymal calcification noted in the interpolar right kidney as well as in the inferior pole of the right kidney. Indeterminate small hypodense lesions from the left kidney measure up to 15 mm, suboptimally characterized on this noncontrast CT but likely cysts. Ultrasound may provide better characterization on a nonemergent/outpatient basis if clinically indicated. The visualized ureters appear unremarkable. The urinary bladder is partially distended and grossly unremarkable. Stomach/Bowel: There is loose stool throughout the colon compatible with diarrheal state. There is mild thickening of the colonic wall without significant pericolonic stranding which may represent mild residual colitis. There is no bowel obstruction. The appendix is not visualized with certainty. No inflammatory changes identified in the right lower quadrant. Vascular/Lymphatic: Mild aortoiliac atherosclerotic disease. The IVC is unremarkable. No portal venous gas. There is no adenopathy. Reproductive: The uterus is anteverted. There is a 3.6 cm right adnexal cyst as seen previously. Recommend follow-up US in 6-12 months. Note: This recommendation does not apply to premenarchal patients and to those with increased risk (genetic, family history, elevated tumor markers or other high-risk factors) of ovarian cancer. Reference: JACR 2020 Feb; 17(2):248-254 Other: Postsurgical changes of anterior abdominal wall. Musculoskeletal: Degenerative changes of the spine. No acute osseous pathology. IMPRESSION: 1. Diarrheal state with probable mild colitis. Correlation with clinical exam and stool cultures recommended. No bowel obstruction. 2. Fatty liver. 3. Aortic Atherosclerosis (ICD10-I70.0). Electronically Signed   By: Anner Crete M.D.   On: 05/03/2021 15:23    Procedures Procedures    Medications Ordered in ED Medications  sodium chloride 0.9 % bolus 1,000 mL (has no administration in time range)  potassium chloride 10 mEq in 100 mL IVPB (10 mEq Intravenous New Bag/Given 05/03/21 1557)  ondansetron (ZOFRAN) injection 4 mg (4 mg Intravenous Given 05/03/21 1434)  potassium chloride SA (KLOR-CON) CR tablet 40 mEq (40 mEq Oral Given 05/03/21 1424)  0.9 %  sodium chloride infusion ( Intravenous New Bag/Given 05/03/21 1432)    ED Course  I have reviewed the triage vital signs and the nursing notes.  Pertinent labs & imaging results that were available during my care of the patient were reviewed by me and considered in my medical decision making (see chart for details).    MDM Rules/Calculators/A&P                          Alexis Ramos is a 70 year old female with history of Crohn's, hypertension, kidney stones who presents to the ED with abdominal discomfort, dehydration concerns.  Was started on Flagyl and Cipro for colitis last Friday.  Has had poor p.o. intake since.  Denies any worsening of fever or abdominal pain.  Concern for possible dehydration versus worsening of colitis.  She has no real focal abdominal tenderness on exam.  She has dry mucous membranes.  Potassium is 2.5.  Creatinine elevated to 2.38.  COVID test is negative.  Urinalysis negative for infection.  CT scan consistent  with a mild colitis.  Overall patient is dehydrated.  Will admit for potassium repletion and IV fluids.  We will send C. difficile and stool pathogen panel.  Will admit to hospitalist for further care.  Patient follows with [facility] with Dr. Cristina Gong who is on-call today.  We will touch base with him about possibly starting steroids as this could be a Crohn's flare.  Dr. Cristina Gong will evaluate patient and make recommendations.  This chart was dictated using voice recognition software.  Despite best efforts to proofread,  errors can occur which can change the documentation meaning.     Final Clinical Impression(s) / ED Diagnoses Final diagnoses:  Colitis  AKI (acute kidney injury) ([place])  Hypokalemia    Rx / DC Orders ED Discharge Orders    None       Lennice Sites, DO 05/03/21 Spencer, Morrison, DO 05/17/21 1343

## 2021-05-03 NOTE — Progress Notes (Signed)
Pharmacy Antibiotic Note  Alexis Ramos is a 70 y.o. female with hx Crohn's dz and was recently started on cipro and Flagyl PTA for colitis who presented to the ED on 05/03/2021 after he was found to have elevated scr at his PCP office. Abd CT showed colitis. Pharmacy has been consulted to dose cipro for intra-abdominal infection.  Plan: - Ciprofloxacin 400 mg IV q24h for crcl<30 - flagyl 544m IV q8h per MD ___________________________________  Height: 4' 11"  (149.9 cm) Weight: 49.9 kg (110 lb) IBW/kg (Calculated) : 43.2  Temp (24hrs), Avg:98.4 F (36.9 C), Min:98.4 F (36.9 C), Max:98.4 F (36.9 C)  Recent Labs  Lab 05/03/21 1132  WBC 18.5*  CREATININE 2.38*    Estimated Creatinine Clearance: 14.6 mL/min (A) (by C-G formula based on SCr of 2.38 mg/dL (H)).    Allergies  Allergen Reactions  . Nsaids Other (See Comments)    H/o Crohn's disease  . Penicillins Anaphylaxis    Pt grandmother died from it and her brother had anaphylaxis so she was told never to take.  Has patient had a PCN reaction causing immediate rash, facial/tongue/throat swelling, SOB or lightheadedness with hypotension: No Has patient had a PCN reaction causing severe rash involving mucus membranes or skin necrosis: No Has patient had a PCN reaction that required hospitalization No Has patient had a PCN reaction occurring within the last 10 years: No If all of the above answers are "NO", then may pr  . Advil [Ibuprofen]     Bleeding.   .Diona Fanti[Aspirin]     Hemorrhage.     .Lanae Crumbly[Oxaprozin]     Bleeding.    Thank you for allowing pharmacy to be a part of this patient's care.  PLynelle Doctor5/23/2022 5:15 PM

## 2021-05-03 NOTE — H&P (Signed)
History and Physical        Hospital Admission Note Date: 05/03/2021  Patient name: Alexis Ramos Medical record number: [id number] Date of birth: [birth year] Age: 70 y.o. Gender: female  PCP: [name], MD   Chief Complaint    Chief Complaint  Patient presents with  . Abdominal Pain  . abnormal  labs      HPI:   This is a 70 year old female with past medical history of Crohn's disease s/p ileocecectomy, type 2 diabetes, hypertension, hyperlipidemia, SBO, NASH, cholecystitis s/p lap chole, s/p appendectomy who presented to the ED with 1 week of diarrhea and crampy abdominal pain.  States that last weekend she had bought shrimp and tuna which she does not typically eat and on Monday she began having diarrhea which she believes was nonbloody but is unsure.  Typically she has loose bowel movements because of her Crohn's 5 times daily but this increased to 10 times daily over the past week.  Due to her persistent symptoms she saw her PCP on Friday and had a CT abdomen which showed colitis and she was started on Cipro/Flagyl.  She says she thinks she felt worse over the weekend but cannot specify her worsened symptoms.  She went back to her PCP today for follow-up and due to concerns for dehydration she was sent to the ED.  Says that she did have a low-grade fever over the weekend.  Says this does not feel like her typical Crohn's flare.  Has had some nausea but no vomiting and states she has not been passing gas.  Otherwise no other complaints symptoms.   ED Course: Afebrile, hypertensive, on room air. Notable Labs: Sodium 133, K2.5 glucose 182, BUN 42, creatinine 2.38 (previously 0.95 in 2020), lipase 57, WBC 18.5, Hb 15.4, platelets 423, COVID-19 and flu negative. Notable Imaging: CT abdomen pelvis without contrast-probable mild colitis, fatty liver. Patient received Zofran, KCl, 1  L NS bolus   Vitals:   05/03/21 1630 05/03/21 1645  BP:  (!) 129/96  Pulse: 75 71  Resp: 13 19  Temp:    SpO2: 99% 97%     Review of Systems:  Review of Systems  All other systems reviewed and are negative.   Medical/Social/Family History   Past Medical History: Past Medical History:  Diagnosis Date  . Arthritis   . Cancer ([place])    melanoma on back   . Crohn's disease ([place])   . Depression   . DES exposure in utero   . Diabetes ([place])    type 2  . Forgetfulness   . Hyperlipidemia   . Hypertension   . Kidney stones   . Menopausal symptoms   . SBO (small bowel obstruction) ([place]) 06/2011, 11/2018   tx w/steroids  . Snoring     Past Surgical History:  Procedure Laterality Date  . Anal abscess surg    . anal sphincterectomy    . APPENDECTOMY  1970  . [place]   small, x 3  . BREAST BIOPSY Left   . BREAST SURGERY     Biopsy-Benign  . CERVICAL FUSION  05/2009   C4-C7  . CHOLECYSTECTOMY N/A 08/10/2016   Procedure: LAPAROSCOPIC  CHOLECYSTECTOMY WITH INTRAOPERATIVE CHOLANGIOGRAM -STANDARD 4 PORT;  Surgeon: [name], MD;  Location: [facility];  Service: General;  Laterality: N/A;  . [place] AND CURETTAGE OF UTERUS  1984  . HAND SURGERY    . ILEOCECETOMY  1971   Crohns disease flare  . [place]   x2  . KNEE SURGERY Left 1998  . LAPAROSCOPIC LYSIS OF ADHESIONS N/A 08/10/2016   Procedure: LAPAROSCOPIC LYSIS OF ADHESIONS WITH CORE LIVER BIOPSY;  Surgeon: [name], MD;  Location: [facility];  Service: General;  Laterality: N/A;  . LIPOSUCTION  2004   neck  . NASAL SINUS SURGERY  1995  . SKIN CANCER EXCISION  03/2016   melanoma, back  . SMALL INTESTINE SURGERY  1985   Crohn stricture  . SMALL INTESTINE SURGERY  1996   Crohn stricture  . Thumb surg    . TONGUE BASE REDUCTION SOMNOPLASTY  2000   Removed uvula  . TONSILLECTOMY AND ADENOIDECTOMY  1959  . WISDOM TOOTH EXTRACTION  1974    Medications: Prior to Admission  medications   Medication Sig Start Date End Date Taking? Authorizing Provider  ciprofloxacin (CIPRO) 500 MG tablet Take 500 mg by mouth daily. 05/01/21  Yes [provider]  cyanocobalamin (,VITAMIN B-12,) 1000 MCG/ML injection Inject 1,000 mcg into the muscle every 21 ( twenty-one) days. 02/19/21  Yes [provider]  metoprolol succinate (TOPROL-XL) 100 MG 24 hr tablet Take 50 mg by mouth 2 (two) times daily.   Yes [provider]  metroNIDAZOLE (FLAGYL) 250 MG tablet Take 250 mg by mouth 3 (three) times daily. Start date : 05/01/21 05/01/21  Yes [provider]  MITIGARE 0.6 MG CAPS Take 0.6 capsules by mouth daily as needed (gout). 04/08/21  Yes [provider]  omeprazole (PRILOSEC) 20 MG capsule Take 20 mg by mouth daily as needed (heartburn). 05/11/19  Yes [provider]  triamcinolone (NASACORT) 55 MCG/ACT AERO nasal inhaler Place 1 spray into the nose daily as needed (allergy).   Yes [provider]  venlafaxine (EFFEXOR) 75 MG tablet Take 150-225 mg by mouth See admin instructions. Takes 225 mg in the morning and 150 mg at bedtime 05/16/16  Yes [provider]  amLODipine (NORVASC) 5 MG tablet Take 5 mg by mouth daily. 02/28/16   [provider]  budesonide (ENTOCORT EC) 3 MG 24 hr capsule Take 3 mg by mouth every morning. 03/10/21   [provider]  Cholecalciferol (VITAMIN D-3) 5000 units TABS Take 5,000 Units by mouth daily.    [provider]  fenofibrate 54 MG tablet Take 54 mg by mouth daily. 02/10/21   [provider]  losartan (COZAAR) 100 MG tablet Take 100 mg by mouth daily. 05/14/19   [provider]  Multiple Vitamins-Minerals (PRESERVISION AREDS) CAPS Take 1 capsule by mouth daily.    [provider]  pioglitazone (ACTOS) 15 MG tablet Take 15 mg by mouth daily. 04/05/21   [provider]  Vitamin E 400 units TABS Take 800 Units by mouth daily.    [provider]    Allergies:   Allergies  Allergen Reactions  . Nsaids Other (See Comments)    H/o Crohn's disease  . Penicillins Anaphylaxis    Pt grandmother died from it and her brother had anaphylaxis so she was told never to take.  Has patient had a PCN reaction causing immediate rash, facial/tongue/throat swelling, SOB or lightheadedness with hypotension: No Has patient had a  PCN reaction causing severe rash involving mucus membranes or skin necrosis: No Has patient had a PCN reaction that required hospitalization No Has patient had a PCN reaction occurring within the last 10 years: No If all of the above answers are "NO", then may pr  . Advil [Ibuprofen]     Bleeding.   Diona Fanti [Aspirin]     Hemorrhage.     Lanae Crumbly [Oxaprozin]     Bleeding.    Social History:  reports that she has never smoked. She has never used smokeless tobacco. She reports that she does not drink alcohol and does not use drugs.  Family History: Family History  Problem Relation Age of Onset  . Diabetes Mother   . Hypertension Mother   . Osteoporosis Mother   . Hypertension Father   . Heart disease Father   . Prostate cancer Father   . Diabetes Brother   . Hypertension Brother   . Cancer Other        Niece-Glioblastoma  . Breast cancer Neg Hx      Objective   Physical Exam: Blood pressure (!) 129/96, pulse 71, temperature 98.4 F (36.9 C), temperature source Oral, resp. rate 19, height 4' 11"  (1.499 m), weight 49.9 kg, SpO2 97 %.  Physical Exam Vitals and nursing note reviewed.  Constitutional:      Appearance: Normal appearance.  HENT:     Head: Normocephalic and atraumatic.     Mouth/Throat:     Mouth: Mucous membranes are dry.  Eyes:     Conjunctiva/sclera: Conjunctivae normal.  Cardiovascular:     Rate and Rhythm: Normal rate and regular rhythm.  Pulmonary:     Effort: Pulmonary effort is normal.     Breath sounds: Normal breath sounds.  Abdominal:     General: Abdomen is  flat.     Palpations: Abdomen is soft.  Musculoskeletal:        General: No swelling or tenderness.  Skin:    Coloration: Skin is not jaundiced or pale.  Neurological:     Mental Status: She is alert. Mental status is at baseline.  Psychiatric:        Mood and Affect: Mood normal.        Behavior: Behavior normal.     LABS on Admission: I have personally reviewed all the labs and imaging below    Basic Metabolic Panel: Recent Labs  Lab 05/03/21 1132  NA 133*  K 2.5*  CL 98  CO2 21*  GLUCOSE 182*  BUN 42*  CREATININE 2.38*  CALCIUM 9.2   Liver Function Tests: Recent Labs  Lab 05/03/21 1132  AST 18  ALT 19  ALKPHOS 73  BILITOT 0.4  PROT 8.4*  ALBUMIN 4.3   Recent Labs  Lab 05/03/21 1132  LIPASE 57*   No results for input(s): AMMONIA in the last 168 hours. CBC: Recent Labs  Lab 05/03/21 1132  WBC 18.5*  HGB 15.4*  HCT 45.8  MCV 87.2  PLT 423*   Cardiac Enzymes: No results for input(s): CKTOTAL, CKMB, CKMBINDEX, TROPONINI in the last 168 hours. BNP: Invalid input(s): POCBNP CBG: No results for input(s): GLUCAP in the last 168 hours.  Radiological Exams on Admission:  CT ABDOMEN PELVIS WO CONTRAST  Result Date: 05/03/2021 CLINICAL DATA:  70 year old female with leukocytosis. Concern for colitis. EXAM: CT ABDOMEN AND PELVIS WITHOUT CONTRAST TECHNIQUE: Multidetector CT imaging of the abdomen and pelvis was performed following the standard protocol without IV contrast. COMPARISON:  CT abdomen  pelvis dated 04/30/2021. FINDINGS: Evaluation of this exam is limited in the absence of intravenous contrast. Lower chest: The visualized lung bases are clear. There is coronary vascular calcification. No intra-abdominal free air or free fluid. Hepatobiliary: Diffuse fatty liver. No intrahepatic biliary dilatation. Cholecystectomy. No retained calcified stone noted in the central CBD. Pancreas: Unremarkable. No pancreatic ductal dilatation or surrounding inflammatory  changes. Spleen: Normal in size without focal abnormality. Adrenals/Urinary Tract: The adrenal glands are unremarkable. There is no hydronephrosis on either side. Vascular calcification versus punctate nonobstructing left renal upper pole calculus. Small focus of parenchymal calcification noted in the interpolar right kidney as well as in the inferior pole of the right kidney. Indeterminate small hypodense lesions from the left kidney measure up to 15 mm, suboptimally characterized on this noncontrast CT but likely cysts. Ultrasound may provide better characterization on a nonemergent/outpatient basis if clinically indicated. The visualized ureters appear unremarkable. The urinary bladder is partially distended and grossly unremarkable. Stomach/Bowel: There is loose stool throughout the colon compatible with diarrheal state. There is mild thickening of the colonic wall without significant pericolonic stranding which may represent mild residual colitis. There is no bowel obstruction. The appendix is not visualized with certainty. No inflammatory changes identified in the right lower quadrant. Vascular/Lymphatic: Mild aortoiliac atherosclerotic disease. The IVC is unremarkable. No portal venous gas. There is no adenopathy. Reproductive: The uterus is anteverted. There is a 3.6 cm right adnexal cyst as seen previously. Recommend follow-up US in 6-12 months. Note: This recommendation does not apply to premenarchal patients and to those with increased risk (genetic, family history, elevated tumor markers or other high-risk factors) of ovarian cancer. Reference: JACR 2020 Feb; 17(2):248-254 Other: Postsurgical changes of anterior abdominal wall. Musculoskeletal: Degenerative changes of the spine. No acute osseous pathology. IMPRESSION: 1. Diarrheal state with probable mild colitis. Correlation with clinical exam and stool cultures recommended. No bowel obstruction. 2. Fatty liver. 3. Aortic Atherosclerosis (ICD10-I70.0).  Electronically Signed   By: Anner Crete M.D.   On: 05/03/2021 15:23      EKG: Pending   A & P   Principal Problem:   Colitis presumed infectious Active Problems:   Hypertension   Crohn's disease involving terminal ileum (HCC)   AKI (acute kidney injury) ([place])   Hypokalemia   1. Suspected infectious colitis vs less likely Crohn's flare a. Check C. difficile and stool studies b. Will continue Cipro and Flagyl for now due to penicillin listed as an allergy, pending above studies c. Change NS to LR d. Clear liquid diet e. Discussed with GI -hold budesonide and restart at same dose at discharge  2. AKI likely secondary to GI losses a. Change NS to LR b. Hold nephrotoxic agents  3. Hypokalemia secondary to GI losses a. Replete and check magnesium  4. Hypertension a. Continue amlodipine and Toprol-XL  5. Type 2 diabetes a. Holding Actos b. Check HbA1c c. sensitive sliding scale   DVT prophylaxis: Heparin   Code Status: Full Code  Diet: Clear liquid diet Family Communication: Admission, patients condition and plan of care including tests being ordered have been discussed with the patient who indicates understanding and agrees with the plan and Code Status.  Disposition Plan: The appropriate patient status for this patient is OBSERVATION. Observation status is judged to be reasonable and necessary in order to provide the required intensity of service to ensure the patient's safety. The patient's presenting symptoms, physical exam findings, and initial radiographic and laboratory data in the context of their  medical condition is felt to place them at decreased risk for further clinical deterioration. Furthermore, it is anticipated that the patient will be medically stable for discharge from the hospital within 2 midnights of admission. The following factors support the patient status of observation.   " The patient's presenting symptoms include intractable diarrhea. " The  physical exam findings include dry mucous membranes. " The initial radiographic and laboratory data are AKI, hypokalemia.    Consultants  . GI  Procedures  . None  Time Spent on Admission: 55 minutes    Harold Hedge, DO [facility]  05/03/2021, 4:59 PM

## 2021-05-03 NOTE — Progress Notes (Signed)
Patient arrives to room 1512 at this time via stretcher from ED.  Patient independent from stretcher to bed with steady gait noted.  Patient educated on callbell and bed control use with stated understanding

## 2021-05-03 NOTE — ED Triage Notes (Signed)
Patient went to her PCP today. patient was sent to the ED for a WBC 18.97 and elevated Creatinine and BUN.  patient has been treated for colitis with antibiotics, but was told they did not seem to be working.  Patient also reports a history of Crohn's

## 2021-05-04 DIAGNOSIS — E119 Type 2 diabetes mellitus without complications: Secondary | ICD-10-CM | POA: Diagnosis present

## 2021-05-04 DIAGNOSIS — Z886 Allergy status to analgesic agent status: Secondary | ICD-10-CM | POA: Diagnosis not present

## 2021-05-04 DIAGNOSIS — K529 Noninfective gastroenteritis and colitis, unspecified: Secondary | ICD-10-CM | POA: Diagnosis present

## 2021-05-04 DIAGNOSIS — Z833 Family history of diabetes mellitus: Secondary | ICD-10-CM | POA: Diagnosis not present

## 2021-05-04 DIAGNOSIS — I10 Essential (primary) hypertension: Secondary | ICD-10-CM | POA: Diagnosis present

## 2021-05-04 DIAGNOSIS — E876 Hypokalemia: Secondary | ICD-10-CM | POA: Diagnosis present

## 2021-05-04 DIAGNOSIS — Z9049 Acquired absence of other specified parts of digestive tract: Secondary | ICD-10-CM | POA: Diagnosis not present

## 2021-05-04 DIAGNOSIS — Z85820 Personal history of malignant melanoma of skin: Secondary | ICD-10-CM | POA: Diagnosis not present

## 2021-05-04 DIAGNOSIS — M1990 Unspecified osteoarthritis, unspecified site: Secondary | ICD-10-CM | POA: Diagnosis present

## 2021-05-04 DIAGNOSIS — Z87892 Personal history of anaphylaxis: Secondary | ICD-10-CM | POA: Diagnosis not present

## 2021-05-04 DIAGNOSIS — E860 Dehydration: Secondary | ICD-10-CM | POA: Diagnosis present

## 2021-05-04 DIAGNOSIS — K7581 Nonalcoholic steatohepatitis (NASH): Secondary | ICD-10-CM | POA: Diagnosis present

## 2021-05-04 DIAGNOSIS — E785 Hyperlipidemia, unspecified: Secondary | ICD-10-CM | POA: Diagnosis present

## 2021-05-04 DIAGNOSIS — A09 Infectious gastroenteritis and colitis, unspecified: Secondary | ICD-10-CM | POA: Diagnosis present

## 2021-05-04 DIAGNOSIS — E871 Hypo-osmolality and hyponatremia: Secondary | ICD-10-CM | POA: Diagnosis present

## 2021-05-04 DIAGNOSIS — Z20822 Contact with and (suspected) exposure to covid-19: Secondary | ICD-10-CM | POA: Diagnosis present

## 2021-05-04 DIAGNOSIS — Z880 Allergy status to penicillin: Secondary | ICD-10-CM | POA: Diagnosis not present

## 2021-05-04 DIAGNOSIS — F32A Depression, unspecified: Secondary | ICD-10-CM | POA: Diagnosis present

## 2021-05-04 DIAGNOSIS — Z7952 Long term (current) use of systemic steroids: Secondary | ICD-10-CM | POA: Diagnosis not present

## 2021-05-04 DIAGNOSIS — Z87442 Personal history of urinary calculi: Secondary | ICD-10-CM | POA: Diagnosis not present

## 2021-05-04 DIAGNOSIS — N179 Acute kidney failure, unspecified: Secondary | ICD-10-CM | POA: Diagnosis present

## 2021-05-04 DIAGNOSIS — K760 Fatty (change of) liver, not elsewhere classified: Secondary | ICD-10-CM | POA: Diagnosis present

## 2021-05-04 DIAGNOSIS — K5000 Crohn's disease of small intestine without complications: Secondary | ICD-10-CM | POA: Diagnosis present

## 2021-05-04 DIAGNOSIS — Z981 Arthrodesis status: Secondary | ICD-10-CM | POA: Diagnosis not present

## 2021-05-04 DIAGNOSIS — Z79899 Other long term (current) drug therapy: Secondary | ICD-10-CM | POA: Diagnosis not present

## 2021-05-04 LAB — CBC
HCT: 37.6 % (ref 36.0–46.0)
Hemoglobin: 12.7 g/dL (ref 12.0–15.0)
MCH: 29.9 pg (ref 26.0–34.0)
MCHC: 33.8 g/dL (ref 30.0–36.0)
MCV: 88.5 fL (ref 80.0–100.0)
Platelets: 310 10*3/uL (ref 150–400)
RBC: 4.25 MIL/uL (ref 3.87–5.11)
RDW: 13.8 % (ref 11.5–15.5)
WBC: 15.1 10*3/uL — ABNORMAL HIGH (ref 4.0–10.5)
nRBC: 0 % (ref 0.0–0.2)

## 2021-05-04 LAB — GASTROINTESTINAL PANEL BY PCR, STOOL (REPLACES STOOL CULTURE)

## 2021-05-04 LAB — C DIFFICILE QUICK SCREEN W PCR REFLEX
C Diff antigen: NEGATIVE
C Diff interpretation: NOT DETECTED
C Diff toxin: NEGATIVE

## 2021-05-04 LAB — GLUCOSE, CAPILLARY
Glucose-Capillary: 100 mg/dL — ABNORMAL HIGH (ref 70–99)
Glucose-Capillary: 108 mg/dL — ABNORMAL HIGH (ref 70–99)
Glucose-Capillary: 146 mg/dL — ABNORMAL HIGH (ref 70–99)
Glucose-Capillary: 170 mg/dL — ABNORMAL HIGH (ref 70–99)

## 2021-05-04 LAB — BASIC METABOLIC PANEL
Anion gap: 7 (ref 5–15)
BUN: 33 mg/dL — ABNORMAL HIGH (ref 8–23)
CO2: 24 mmol/L (ref 22–32)
Calcium: 8.5 mg/dL — ABNORMAL LOW (ref 8.9–10.3)
Chloride: 107 mmol/L (ref 98–111)
Creatinine, Ser: 1.98 mg/dL — ABNORMAL HIGH (ref 0.44–1.00)
GFR, Estimated: 26 mL/min — ABNORMAL LOW (ref >60)
Glucose, Bld: 120 mg/dL — ABNORMAL HIGH (ref 70–99)
Potassium: 2.9 mmol/L — ABNORMAL LOW (ref 3.5–5.1)
Sodium: 138 mmol/L (ref 135–145)

## 2021-05-04 LAB — MAGNESIUM: Magnesium: 1.7 mg/dL (ref 1.7–2.4)

## 2021-05-04 LAB — HEMOGLOBIN A1C
Hgb A1c MFr Bld: 6.8 % — ABNORMAL HIGH (ref 4.8–5.6)
Mean Plasma Glucose: 148 mg/dL

## 2021-05-04 MED ORDER — VENLAFAXINE HCL 75 MG PO TABS: 150.0000 mg | ORAL_TABLET | ORAL | Status: DC

## 2021-05-04 MED ORDER — VENLAFAXINE HCL 75 MG PO TABS
225.0000 mg | ORAL_TABLET | Freq: Every day | ORAL | Status: DC
  Administered 2021-05-04 – 2021-05-05 (×2): 225 mg via ORAL
  Filled 2021-05-04 (×2): qty 3

## 2021-05-04 MED ORDER — VENLAFAXINE HCL 75 MG PO TABS
150.0000 mg | ORAL_TABLET | Freq: Every day | ORAL | Status: DC
  Administered 2021-05-04: 150 mg via ORAL
  Filled 2021-05-04: qty 2

## 2021-05-04 MED ORDER — POTASSIUM CHLORIDE IN NACL 40-0.9 MEQ/L-% IV SOLN
INTRAVENOUS | Status: DC
  Filled 2021-05-04 (×2): qty 1000

## 2021-05-04 MED ORDER — POTASSIUM CHLORIDE 10 MEQ/100ML IV SOLN
10.0000 meq | INTRAVENOUS | Status: AC
  Administered 2021-05-04 (×6): 10 meq via INTRAVENOUS
  Filled 2021-05-04 (×6): qty 100

## 2021-05-04 NOTE — Progress Notes (Signed)
Patient ID: Alexis Ramos, female   DOB: May 27, 1951, 70 y.o.   MRN: [id number]  PROGRESS NOTE    Alexis Ramos  YOV:[id number] DOB: [birth year] DOA: 05/03/2021 PCP: [name], MD   Brief Narrative:  70 year old female with history of Crohn's disease s/p ileocecectomy, type 2 diabetes, hypertension, hyperlipidemia, SBO, NASH, cholecystitis s/p lap chole, s/p appendectomy who presented on 05/03/2021 with worsening diarrhea and crampy abdominal for a week after eating shrimp and tuna a week ago prior to presentation.  She had seen her PCP as an outpatient and a CT of the abdomen had shown colitis and was started on Cipro and Flagyl but she had worsening of symptoms.  On presentation, potassium was 2.5, creatinine was 2.38 (previously 0.95 in 2020) with WBCs of 18.5; COVID-19 and flu tests were negative.  CT abdomen and pelvis without contrast: Probable mild colitis, fatty liver.  She was started on IV fluids, IV ciprofloxacin/Flagyl.  GI was consulted.  Assessment & Plan:   Suspected infectious colitis History of Crohn's disease with questionable flare -Presented with worsening abdominal pain and diarrhea for a week and CT of the abdomen and pelvis showed probable mild colitis. -Stool for C. difficile negative.  GI PCR pending.  Continue Cipro and Flagyl for now. -Continue IV fluids. -GI following and planning for unprepped colonoscopy today.  AKI -Possibly from dehydration.  Presented with creatinine of 2.38 (previously 0.95 in 2020).  Improving to 1.98 today.  Continue IV fluids.  Leukocytosis -Improving  Hypokalemia -Replace.  Repeat a.m. labs  Hyponatremia Resolved  Hypertension -Monitor blood pressure.  Continue amlodipine and metoprolol  Depression -Continue home regimen of venlafaxine  Diabetes mellitus type 2 -Continue CBGs with SSI.  Actos on hold.   DVT prophylaxis: Heparin Code Status: Full Family Communication: None at bedside Disposition Plan: Status is:  Observation  The patient will require care spanning > 2 midnights and should be moved to inpatient because: Inpatient level of care appropriate due to severity of illness.  GI is planning for colonoscopy today.  Will need continued IV fluids.  Dispo: The patient is from: Home              Anticipated d/c is to: Home              Patient currently is not medically stable to d/c.   Difficult to place patient No  Consultants: GI  Procedures: None  Antimicrobials: Cipro and Flagyl from 05/03/2021 onwards   Subjective: Patient seen and examined at bedside.  Feels that her diarrhea is slightly improving.  Denies worsening abdominal pain.  Does not have an appetite currently.  No overnight fever or vomiting reported.  Denies chest pains.  Objective: Vitals:   05/03/21 1645 05/03/21 1845 05/03/21 2208 05/04/21 0442  BP: (!) 129/96 (!) 144/78 (!) 149/51 122/60  Pulse: 71 71 65 64  Resp: 19 18 19 17   Temp:  98.1 F (36.7 C) 98.9 F (37.2 C) 98.4 F (36.9 C)  TempSrc:      SpO2: 97% 94% 98% 98%  Weight:      Height:        Intake/Output Summary (Last 24 hours) at 05/04/2021 1055 Last data filed at 05/04/2021 0549 Gross per 24 hour  Intake 2825.05 ml  Output 0 ml  Net 2825.05 ml   Filed Weights   05/03/21 1129  Weight: 49.9 kg    Examination:  General exam: Appears calm and comfortable.  Currently on room air Respiratory system: Bilateral decreased  breath sounds at bases Cardiovascular system: S1 & S2 heard, Rate controlled Gastrointestinal system: Abdomen is slightly distended, soft and nontender. Normal bowel sounds heard. Extremities: No cyanosis, clubbing, edema  Central nervous system: Alert and oriented. No focal neurological deficits. Moving extremities Skin: No rashes, lesions or ulcers Psychiatry: Judgement and insight appear normal. Mood & affect appropriate.     Data Reviewed: I have personally reviewed following labs and imaging studies  CBC: Recent Labs   Lab 05/03/21 1132 05/04/21 0500  WBC 18.5* 15.1*  HGB 15.4* 12.7  HCT 45.8 37.6  MCV 87.2 88.5  PLT 423* 631   Basic Metabolic Panel: Recent Labs  Lab 05/03/21 1132 05/04/21 0500  NA 133* 138  K 2.5* 2.9*  CL 98 107  CO2 21* 24  GLUCOSE 182* 120*  BUN 42* 33*  CREATININE 2.38* 1.98*  CALCIUM 9.2 8.5*  MG 1.8 1.7   GFR: Estimated Creatinine Clearance: 17.5 mL/min (A) (by C-G formula based on SCr of 1.98 mg/dL (H)). Liver Function Tests: Recent Labs  Lab 05/03/21 1132  AST 18  ALT 19  ALKPHOS 73  BILITOT 0.4  PROT 8.4*  ALBUMIN 4.3   Recent Labs  Lab 05/03/21 1132  LIPASE 57*   No results for input(s): AMMONIA in the last 168 hours. Coagulation Profile: No results for input(s): INR, PROTIME in the last 168 hours. Cardiac Enzymes: No results for input(s): CKTOTAL, CKMB, CKMBINDEX, TROPONINI in the last 168 hours. BNP (last 3 results) No results for input(s): PROBNP in the last 8760 hours. HbA1C: No results for input(s): HGBA1C in the last 72 hours. CBG: Recent Labs  Lab 05/03/21 1847 05/03/21 2030 05/04/21 0758  GLUCAP 99 97 100*   Lipid Profile: No results for input(s): CHOL, HDL, LDLCALC, TRIG, CHOLHDL, LDLDIRECT in the last 72 hours. Thyroid Function Tests: No results for input(s): TSH, T4TOTAL, FREET4, T3FREE, THYROIDAB in the last 72 hours. Anemia Panel: No results for input(s): VITAMINB12, FOLATE, FERRITIN, TIBC, IRON, RETICCTPCT in the last 72 hours. Sepsis Labs: No results for input(s): PROCALCITON, LATICACIDVEN in the last 168 hours.  Recent Results (from the past 240 hour(s))  Resp Panel by RT-PCR (Flu A&B, Covid) Nasopharyngeal Swab     Status: None   Collection Time: 05/03/21  2:40 PM   Specimen: Nasopharyngeal Swab; Nasopharyngeal(NP) swabs in vial transport medium  Result Value Ref Range Status   SARS Coronavirus 2 by RT PCR NEGATIVE NEGATIVE Final    Comment: (NOTE) SARS-CoV-2 target nucleic acids are NOT DETECTED.  The  SARS-CoV-2 RNA is generally detectable in upper respiratory specimens during the acute phase of infection. The lowest concentration of SARS-CoV-2 viral copies this assay can detect is 138 copies/mL. A negative result does not preclude SARS-Cov-2 infection and should not be used as the sole basis for treatment or other patient management decisions. A negative result may occur with  improper specimen collection/handling, submission of specimen other than nasopharyngeal swab, presence of viral mutation(s) within the areas targeted by this assay, and inadequate number of viral copies(<138 copies/mL). A negative result must be combined with clinical observations, patient history, and epidemiological information. The expected result is Negative.  Fact Sheet for Patients:  EntrepreneurPulse.com.au  Fact Sheet for Healthcare Providers:  IncredibleEmployment.be  This test is no t yet approved or cleared by the [place] FDA and  has been authorized for detection and/or diagnosis of SARS-CoV-2 by FDA under an Emergency Use Authorization (EUA). This EUA will remain  in effect (meaning this test can  be used) for the duration of the COVID-19 declaration under Section 564(b)(1) of the Act, 21 U.S.C.section 360bbb-3(b)(1), unless the authorization is terminated  or revoked sooner.       Influenza A by PCR NEGATIVE NEGATIVE Final   Influenza B by PCR NEGATIVE NEGATIVE Final    Comment: (NOTE) The Xpert Xpress SARS-CoV-2/FLU/RSV plus assay is intended as an aid in the diagnosis of influenza from Nasopharyngeal swab specimens and should not be used as a sole basis for treatment. Nasal washings and aspirates are unacceptable for Xpert Xpress SARS-CoV-2/FLU/RSV testing.  Fact Sheet for Patients: EntrepreneurPulse.com.au  Fact Sheet for Healthcare Providers: IncredibleEmployment.be  This test is not yet approved or  cleared by the [place] FDA and has been authorized for detection and/or diagnosis of SARS-CoV-2 by FDA under an Emergency Use Authorization (EUA). This EUA will remain in effect (meaning this test can be used) for the duration of the COVID-19 declaration under Section 564(b)(1) of the Act, 21 U.S.C. section 360bbb-3(b)(1), unless the authorization is terminated or revoked.  Performed at [facility], [place] [address], [place], [place] [address]   C Difficile Quick Screen w PCR reflex     Status: None   Collection Time: 05/03/21 10:30 PM   Specimen: Rectum; Stool  Result Value Ref Range Status   C Diff antigen NEGATIVE NEGATIVE Final   C Diff toxin NEGATIVE NEGATIVE Final   C Diff interpretation No C. difficile detected.  Final    Comment: Performed at [facility], [place] [address] [place], [place] [address]         Radiology Studies: CT ABDOMEN PELVIS WO CONTRAST  Result Date: 05/03/2021 CLINICAL DATA:  70 year old female with leukocytosis. Concern for colitis. EXAM: CT ABDOMEN AND PELVIS WITHOUT CONTRAST TECHNIQUE: Multidetector CT imaging of the abdomen and pelvis was performed following the standard protocol without IV contrast. COMPARISON:  CT abdomen pelvis dated 04/30/2021. FINDINGS: Evaluation of this exam is limited in the absence of intravenous contrast. Lower chest: The visualized lung bases are clear. There is coronary vascular calcification. No intra-abdominal free air or free fluid. Hepatobiliary: Diffuse fatty liver. No intrahepatic biliary dilatation. Cholecystectomy. No retained calcified stone noted in the central CBD. Pancreas: Unremarkable. No pancreatic ductal dilatation or surrounding inflammatory changes. Spleen: Normal in size without focal abnormality. Adrenals/Urinary Tract: The adrenal glands are unremarkable. There is no hydronephrosis on either side. Vascular calcification versus punctate nonobstructing left renal upper  pole calculus. Small focus of parenchymal calcification noted in the interpolar right kidney as well as in the inferior pole of the right kidney. Indeterminate small hypodense lesions from the left kidney measure up to 15 mm, suboptimally characterized on this noncontrast CT but likely cysts. Ultrasound may provide better characterization on a nonemergent/outpatient basis if clinically indicated. The visualized ureters appear unremarkable. The urinary bladder is partially distended and grossly unremarkable. Stomach/Bowel: There is loose stool throughout the colon compatible with diarrheal state. There is mild thickening of the colonic wall without significant pericolonic stranding which may represent mild residual colitis. There is no bowel obstruction. The appendix is not visualized with certainty. No inflammatory changes identified in the right lower quadrant. Vascular/Lymphatic: Mild aortoiliac atherosclerotic disease. The IVC is unremarkable. No portal venous gas. There is no adenopathy. Reproductive: The uterus is anteverted. There is a 3.6 cm right adnexal cyst as seen previously. Recommend follow-up US in 6-12 months. Note: This recommendation does not apply to premenarchal patients and to those with increased risk (genetic, family  history, elevated tumor markers or other high-risk factors) of ovarian cancer. Reference: JACR 2020 Feb; 17(2):248-254 Other: Postsurgical changes of anterior abdominal wall. Musculoskeletal: Degenerative changes of the spine. No acute osseous pathology. IMPRESSION: 1. Diarrheal state with probable mild colitis. Correlation with clinical exam and stool cultures recommended. No bowel obstruction. 2. Fatty liver. 3. Aortic Atherosclerosis (ICD10-I70.0). Electronically Signed   By: Anner Crete M.D.   On: 05/03/2021 15:23        Scheduled Meds: . amLODipine  5 mg Oral Daily  . heparin  5,000 Units Subcutaneous Q8H  . insulin aspart  0-9 Units Subcutaneous TID WC  .  metoprolol succinate  50 mg Oral BID  . sodium chloride flush  3 mL Intravenous Q12H  . venlafaxine  225 mg Oral Daily   And  . venlafaxine  150 mg Oral QHS   Continuous Infusions: . 0.9 % NaCl with KCl 40 mEq / L 75 mL/hr at 05/04/21 0933  . ciprofloxacin 400 mg (05/03/21 2006)  . metronidazole 500 mg (05/04/21 0503)  . potassium chloride 10 mEq (05/04/21 1046)          Aline August, MD [facility] 05/04/2021, 10:55 AM

## 2021-05-04 NOTE — Progress Notes (Signed)
[facility] Progress Note  Alexis Ramos 70 y.o. 1951/02/04  CC:  Colitis  Subjective: Patient reports feeling better today. Denies abdominal pain. Has had 2-3 BMs thus far today. Denies melena or hematochezia.  ROS : Review of Systems  Cardiovascular: Negative for chest pain and palpitations.  Gastrointestinal: Positive for diarrhea. Negative for abdominal pain, blood in stool, constipation, heartburn, melena, nausea and vomiting.    Objective: Vital signs in last 24 hours: Vitals:   05/03/21 2208 05/04/21 0442  BP: (!) 149/51 122/60  Pulse: 65 64  Resp: 19 17  Temp: 98.9 F (37.2 C) 98.4 F (36.9 C)  SpO2: 98% 98%    Physical Exam:  General:  Alert, cooperative, no distress  Head:  Normocephalic, without obvious abnormality, atraumatic  Eyes:  Anicteric sclera, EOMs intact   Lungs:   Clear to auscultation bilaterally, respirations unlabored  Heart:  Regular rate and rhythm, S1, S2 normal  Abdomen:   Soft, non-tender, non-distended, bowel sounds active all four quadrants  Extremities: Extremities normal, atraumatic, no  edema    Lab Results: Recent Labs    05/03/21 1132 05/04/21 0500  NA 133* 138  K 2.5* 2.9*  CL 98 107  CO2 21* 24  GLUCOSE 182* 120*  BUN 42* 33*  CREATININE 2.38* 1.98*  CALCIUM 9.2 8.5*  MG 1.8 1.7   Recent Labs    05/03/21 1132  AST 18  ALT 19  ALKPHOS 73  BILITOT 0.4  PROT 8.4*  ALBUMIN 4.3   Recent Labs    05/03/21 1132 05/04/21 0500  WBC 18.5* 15.1*  HGB 15.4* 12.7  HCT 45.8 37.6  MCV 87.2 88.5  PLT 423* 310   No results for input(s): LABPROT, INR in the last 72 hours.  Assessment/Plan: Colitis on CT imaging:  Suspect infectious etiology.  Crohn's colitis less likely, as patient has history of Crohn's ileitis only.  Normal CRP. -WBCs 15.1, decreased from 18.5 K/uL yesterday -Hgb 12.7 -C. Diff negative, GI pathogen panel pending  AKI, improving: BUN 33/Cr 1.98, decreased from yesterday BUN 42/ Cr  2.38  Hypokalemia: K+ 2.9  Plan: Given improvement in symptoms, will cancel unprepped colonoscopy.  Await GI pathogen panel.  Soft diet.  Continue IVF and supportive care.  [facility] will follow.   Salley Slaughter PA-C 05/04/2021, 12:21 PM  Contact #  [phone]

## 2021-05-05 DIAGNOSIS — I10 Essential (primary) hypertension: Secondary | ICD-10-CM | POA: Diagnosis not present

## 2021-05-05 DIAGNOSIS — N179 Acute kidney failure, unspecified: Secondary | ICD-10-CM | POA: Diagnosis not present

## 2021-05-05 DIAGNOSIS — K529 Noninfective gastroenteritis and colitis, unspecified: Secondary | ICD-10-CM | POA: Diagnosis not present

## 2021-05-05 DIAGNOSIS — K5000 Crohn's disease of small intestine without complications: Secondary | ICD-10-CM | POA: Diagnosis not present

## 2021-05-05 LAB — BASIC METABOLIC PANEL
Anion gap: 6 (ref 5–15)
BUN: 24 mg/dL — ABNORMAL HIGH (ref 8–23)
CO2: 19 mmol/L — ABNORMAL LOW (ref 22–32)
Calcium: 8.4 mg/dL — ABNORMAL LOW (ref 8.9–10.3)
Chloride: 116 mmol/L — ABNORMAL HIGH (ref 98–111)
Creatinine, Ser: 1.5 mg/dL — ABNORMAL HIGH (ref 0.44–1.00)
GFR, Estimated: 37 mL/min — ABNORMAL LOW (ref >60)
Glucose, Bld: 140 mg/dL — ABNORMAL HIGH (ref 70–99)
Potassium: 3.7 mmol/L (ref 3.5–5.1)
Sodium: 141 mmol/L (ref 135–145)

## 2021-05-05 LAB — CBC
HCT: 36.1 % (ref 36.0–46.0)
Hemoglobin: 11.8 g/dL — ABNORMAL LOW (ref 12.0–15.0)
MCH: 29.9 pg (ref 26.0–34.0)
MCHC: 32.7 g/dL (ref 30.0–36.0)
MCV: 91.6 fL (ref 80.0–100.0)
Platelets: 268 10*3/uL (ref 150–400)
RBC: 3.94 MIL/uL (ref 3.87–5.11)
RDW: 14.1 % (ref 11.5–15.5)
WBC: 15.8 10*3/uL — ABNORMAL HIGH (ref 4.0–10.5)
nRBC: 0 % (ref 0.0–0.2)

## 2021-05-05 LAB — GLUCOSE, CAPILLARY
Glucose-Capillary: 128 mg/dL — ABNORMAL HIGH (ref 70–99)
Glucose-Capillary: 139 mg/dL — ABNORMAL HIGH (ref 70–99)

## 2021-05-05 LAB — MAGNESIUM: Magnesium: 1.5 mg/dL — ABNORMAL LOW (ref 1.7–2.4)

## 2021-05-05 SURGERY — COLONOSCOPY
Anesthesia: Monitor Anesthesia Care

## 2021-05-05 MED ORDER — SACCHAROMYCES BOULARDII 250 MG PO CAPS: 250.0000 mg | ORAL_CAPSULE | Freq: Two times a day (BID) | ORAL | 0 refills | Status: DC

## 2021-05-05 MED ORDER — SACCHAROMYCES BOULARDII 250 MG PO CAPS
250.0000 mg | ORAL_CAPSULE | Freq: Two times a day (BID) | ORAL | Status: DC
  Administered 2021-05-05: 250 mg via ORAL
  Filled 2021-05-05: qty 1

## 2021-05-05 MED ORDER — MAGNESIUM SULFATE 2 GM/50ML IV SOLN
2.0000 g | Freq: Once | INTRAVENOUS | Status: AC
  Administered 2021-05-05: 2 g via INTRAVENOUS
  Filled 2021-05-05: qty 50

## 2021-05-05 NOTE — Discharge Summary (Signed)
Physician Discharge Summary  Alexis Ramos OQH:[id number] DOB: [birth year] DOA: 05/03/2021  PCP: [name], MD  Admit date: 05/03/2021 Discharge date: 05/05/2021  Admitted From: Home Disposition: Home  Recommendations for Outpatient Follow-up:  1. Follow up with PCP in 1 week with repeat CBC/BMP 2. Outpatient follow-up with GI 3. Follow up in ED if symptoms worsen or new appear   Home Health: No Equipment/Devices: None  Discharge Condition: Stable CODE STATUS: Full Diet recommendation: Heart healthy/carb modified/soft diet  Brief/Interim Summary: 70 year old female with history of Crohn's disease s/p ileocecectomy, type 2 diabetes, hypertension, hyperlipidemia, SBO, NASH, cholecystitis s/p lap chole, s/p appendectomy who presented on 05/03/2021 with worsening diarrhea and crampy abdominal for a week after eating shrimp and tuna a week ago prior to presentation.  She had seen her PCP as an outpatient and a CT of the abdomen had shown colitis and was started on Cipro and Flagyl but she had worsening of symptoms.  On presentation, potassium was 2.5, creatinine was 2.38 (previously 0.95 in 2020) with WBCs of 18.5; COVID-19 and flu tests were negative.  CT abdomen and pelvis without contrast: Probable mild colitis, fatty liver.  She was started on IV fluids, IV ciprofloxacin/Flagyl.  GI was consulted.  Discharge Diagnoses:   Suspected infectious colitis History of Crohn's disease with questionable flare -Presented with worsening abdominal pain and diarrhea for a week and CT of the abdomen and pelvis showed probable mild colitis. -Stool for C. difficile negative.  GI PCR negative.    Currently on IV Cipro and Flagyl  -Treated with IV fluids.  Diet has been advanced and patient is tolerating diet.  Initial plan was unprepped colonoscopy by GI but since patient improved quickly, this was canceled. -GI has cleared the patient for discharge with outpatient follow-up with GI.  No need for  any more antibiotics as per GI recommendations.  AKI -Possibly from dehydration.  Presented with creatinine of 2.38 (previously 0.95 in 2020).  Improving to 1.50 today.    Treated with IV fluids.  Outpatient follow-up  Leukocytosis -Outpatient follow-up.  Hypokalemia -Replaced.  Improved.  Hyponatremia -Resolved  Hypertension -Outpatient follow-up. Continue amlodipine and metoprolol  Depression -Continue home regimen of venlafaxine  Diabetes mellitus type 2 -Resume home regimen.  Outpatient follow-up.  Carb modified diet.   Discharge Instructions  Discharge Instructions    Diet - low sodium heart healthy   Complete by: As directed    Diet Carb Modified   Complete by: As directed    Increase activity slowly   Complete by: As directed      Allergies as of 05/05/2021      Reactions   Nsaids Other (See Comments)   H/o Crohn's disease   Penicillins Anaphylaxis   Pt grandmother died from it and her brother had anaphylaxis so she was told never to take.  Has patient had a PCN reaction causing immediate rash, facial/tongue/throat swelling, SOB or lightheadedness with hypotension: No Has patient had a PCN reaction causing severe rash involving mucus membranes or skin necrosis: No Has patient had a PCN reaction that required hospitalization No Has patient had a PCN reaction occurring within the last 10 years: No If all of the above answers are "NO", then may pr   Advil [ibuprofen]    Bleeding.    Asa [aspirin]    Hemorrhage.      Daypro [oxaprozin]    Bleeding.      Medication List    STOP taking these medications   ciprofloxacin  500 MG tablet Commonly known as: CIPRO   metroNIDAZOLE 250 MG tablet Commonly known as: FLAGYL     TAKE these medications   amLODipine 5 MG tablet Commonly known as: NORVASC Take 5 mg by mouth daily.   budesonide 3 MG 24 hr capsule Commonly known as: ENTOCORT EC Take 3 mg by mouth every morning.   cyanocobalamin 1000 MCG/ML  injection Commonly known as: (VITAMIN B-12) Inject 1,000 mcg into the muscle every 21 ( twenty-one) days.   fenofibrate 54 MG tablet Take 54 mg by mouth daily.   losartan 100 MG tablet Commonly known as: COZAAR Take 100 mg by mouth daily.   metoprolol succinate 100 MG 24 hr tablet Commonly known as: TOPROL-XL Take 50 mg by mouth 2 (two) times daily.   Mitigare 0.6 MG Caps Generic drug: Colchicine Take 0.6 capsules by mouth daily as needed (gout).   omeprazole 20 MG capsule Commonly known as: PRILOSEC Take 20 mg by mouth daily as needed (heartburn).   pioglitazone 15 MG tablet Commonly known as: ACTOS Take 15 mg by mouth daily.   PreserVision AREDS Caps Take 1 capsule by mouth daily.   saccharomyces boulardii 250 MG capsule Commonly known as: FLORASTOR Take 1 capsule (250 mg total) by mouth 2 (two) times daily.   triamcinolone 55 MCG/ACT Aero nasal inhaler Commonly known as: NASACORT Place 1 spray into the nose daily as needed (allergy).   venlafaxine 75 MG tablet Commonly known as: EFFEXOR Take 150-225 mg by mouth See admin instructions. Takes 225 mg in the morning and 150 mg at bedtime   Vitamin D-3 125 MCG (5000 UT) Tabs Take 5,000 Units by mouth daily.   Vitamin E 400 units Tabs Take 800 Units by mouth daily.       Follow-up Information    [name], MD. Schedule an appointment as soon as possible for a visit in 1 week(s).   Specialty: Internal Medicine Why: with repeat cbc/bmp Contact information: [place] [address] [phone]        [name], MD. Schedule an appointment as soon as possible for a visit in 1 week(s).   Specialty: Gastroenterology Contact information: [address] [address] [phone]              Allergies  Allergen Reactions  . Nsaids Other (See Comments)    H/o Crohn's disease  . Penicillins Anaphylaxis    Pt grandmother died from it and her brother  had anaphylaxis so she was told never to take.  Has patient had a PCN reaction causing immediate rash, facial/tongue/throat swelling, SOB or lightheadedness with hypotension: No Has patient had a PCN reaction causing severe rash involving mucus membranes or skin necrosis: No Has patient had a PCN reaction that required hospitalization No Has patient had a PCN reaction occurring within the last 10 years: No If all of the above answers are "NO", then may pr  . Advil [Ibuprofen]     Bleeding.   Diona Fanti [Aspirin]     Hemorrhage.     Lanae Crumbly [Oxaprozin]     Bleeding.    Consultations:  GI   Procedures/Studies: CT ABDOMEN PELVIS WO CONTRAST  Result Date: 05/03/2021 CLINICAL DATA:  70 year old female with leukocytosis. Concern for colitis. EXAM: CT ABDOMEN AND PELVIS WITHOUT CONTRAST TECHNIQUE: Multidetector CT imaging of the abdomen and pelvis was performed following the standard protocol without IV contrast. COMPARISON:  CT abdomen pelvis dated 04/30/2021. FINDINGS: Evaluation of this  exam is limited in the absence of intravenous contrast. Lower chest: The visualized lung bases are clear. There is coronary vascular calcification. No intra-abdominal free air or free fluid. Hepatobiliary: Diffuse fatty liver. No intrahepatic biliary dilatation. Cholecystectomy. No retained calcified stone noted in the central CBD. Pancreas: Unremarkable. No pancreatic ductal dilatation or surrounding inflammatory changes. Spleen: Normal in size without focal abnormality. Adrenals/Urinary Tract: The adrenal glands are unremarkable. There is no hydronephrosis on either side. Vascular calcification versus punctate nonobstructing left renal upper pole calculus. Small focus of parenchymal calcification noted in the interpolar right kidney as well as in the inferior pole of the right kidney. Indeterminate small hypodense lesions from the left kidney measure up to 15 mm, suboptimally characterized on this noncontrast CT but  likely cysts. Ultrasound may provide better characterization on a nonemergent/outpatient basis if clinically indicated. The visualized ureters appear unremarkable. The urinary bladder is partially distended and grossly unremarkable. Stomach/Bowel: There is loose stool throughout the colon compatible with diarrheal state. There is mild thickening of the colonic wall without significant pericolonic stranding which may represent mild residual colitis. There is no bowel obstruction. The appendix is not visualized with certainty. No inflammatory changes identified in the right lower quadrant. Vascular/Lymphatic: Mild aortoiliac atherosclerotic disease. The IVC is unremarkable. No portal venous gas. There is no adenopathy. Reproductive: The uterus is anteverted. There is a 3.6 cm right adnexal cyst as seen previously. Recommend follow-up US in 6-12 months. Note: This recommendation does not apply to premenarchal patients and to those with increased risk (genetic, family history, elevated tumor markers or other high-risk factors) of ovarian cancer. Reference: JACR 2020 Feb; 17(2):248-254 Other: Postsurgical changes of anterior abdominal wall. Musculoskeletal: Degenerative changes of the spine. No acute osseous pathology. IMPRESSION: 1. Diarrheal state with probable mild colitis. Correlation with clinical exam and stool cultures recommended. No bowel obstruction. 2. Fatty liver. 3. Aortic Atherosclerosis (ICD10-I70.0). Electronically Signed   By: Anner Crete M.D.   On: 05/03/2021 15:23   CT ABDOMEN PELVIS WO CONTRAST  Result Date: 04/30/2021 CLINICAL DATA:  Diarrhea. Patient reports weakness. Patient reports for 6 months they have been following a lesion in her pelvis. Electronic medical records states history of Crohn's disease. EXAM: CT ABDOMEN AND PELVIS WITHOUT CONTRAST TECHNIQUE: Multidetector CT imaging of the abdomen and pelvis was performed following the standard protocol without IV contrast. COMPARISON:   Most recent available CT 07/24/2020 FINDINGS: Lower chest: Lung bases are clear. No acute airspace disease or pleural effusion. Heart is normal in size. Hepatobiliary: Diffusely decreased hepatic density consistent with steatosis. No evidence of focal liver lesion on noncontrast exam Clips in the gallbladder fossa postcholecystectomy. No biliary dilatation. Pancreas: Mild parenchymal atrophy. No ductal dilatation or inflammation. Spleen: Normal in size without focal abnormality. Adrenals/Urinary Tract: No adrenal nodule. No hydronephrosis. Punctate nonobstructing stone in the upper left kidney. Similar cysts in the upper left kidney to prior exam. No significant perinephric edema. No ureteral calculi. Urinary bladder is completely empty and not well assessed. Stomach/Bowel: Administered enteric contrast reaches the colon. The stomach is decompressed without evidence of wall thickening or gastric inflammation. Persistent but improved wall thickening of the distal and terminal ileum from prior exam, with improved perienteric fat stranding and inflammation. No obstruction. No additional areas of small bowel inflammation or wall thickening. Portions of normal appendix are visualized. Occasional fluid and contrast levels throughout the colon with minimal liquid stool distally. There is mild wall thickening of the mid and distal sigmoid, series 2, image 58  and image 65. No significant pericolonic inflammation. Vascular/Lymphatic: Normal caliber abdominal aorta. Mild atherosclerosis. Few prominent periportal nodes measuring up to 9 mm, typically reactive, not significantly changed from prior. No enlarged lymph nodes in the abdomen or pelvis by size criteria. Reproductive: Normal for age uterine atrophy. Right adnexal cyst measures 3.3 cm and measures simple fluid density, series 2, image 56. This previously measured 3.6 cm. Left ovary is quiescent. Other: No ascites or free fluid. Postsurgical change of the right abdominal  wall. No intra-abdominal fluid collection or abscess. Musculoskeletal: Similar degenerative change in the lower lumbar spine. Avascular necrosis of the right femoral head, chronic. There are no acute or suspicious osseous abnormalities. IMPRESSION: 1. Mild wall thickening of the mid and distal sigmoid colon without significant pericolonic inflammation, suspicious for mild acute colitis. Mild liquid stool throughout the colon. 2. Persistent but improved wall thickening of the distal and terminal ileum from prior exam, with improved perienteric fat stranding and inflammation. 3. Hepatic steatosis. 4. Nonobstructing left nephrolithiasis. 5. Right adnexal cyst measures 3.3 cm, previously 3.6 cm. Given inability to assess for enhancement characteristics this is considered a simple appearing cyst, not adequately characterized. Prompt pelvic ultrasound characterization is recommended. Aortic Atherosclerosis (ICD10-I70.0). Electronically Signed   By: Keith Rake M.D.   On: 04/30/2021 19:38       Subjective: Patient seen and examined at bedside.  Feels much better and states that her diarrhea is improving.  Tolerating diet.  Denies fever, worsening abdominal pain overnight.  Feels that she is ready to go home today.  Discharge Exam: Vitals:   05/05/21 0420 05/05/21 0833  BP: 127/69 122/74  Pulse: 60 65  Resp: 18 20  Temp: 97.9 F (36.6 C) 98.2 F (36.8 C)  SpO2: 97% 95%    General: Pt is alert, awake, not in acute distress Cardiovascular: rate controlled, S1/S2 + Respiratory: bilateral decreased breath sounds at bases Abdominal: Soft, NT, ND, bowel sounds + Extremities: no edema, no cyanosis    The results of significant diagnostics from this hospitalization (including imaging, microbiology, ancillary and laboratory) are listed below for reference.     Microbiology: Recent Results (from the past 240 hour(s))  Resp Panel by RT-PCR (Flu A&B, Covid) Nasopharyngeal Swab     Status: None    Collection Time: 05/03/21  2:40 PM   Specimen: Nasopharyngeal Swab; Nasopharyngeal(NP) swabs in vial transport medium  Result Value Ref Range Status   SARS Coronavirus 2 by RT PCR NEGATIVE NEGATIVE Final    Comment: (NOTE) SARS-CoV-2 target nucleic acids are NOT DETECTED.  The SARS-CoV-2 RNA is generally detectable in upper respiratory specimens during the acute phase of infection. The lowest concentration of SARS-CoV-2 viral copies this assay can detect is 138 copies/mL. A negative result does not preclude SARS-Cov-2 infection and should not be used as the sole basis for treatment or other patient management decisions. A negative result may occur with  improper specimen collection/handling, submission of specimen other than nasopharyngeal swab, presence of viral mutation(s) within the areas targeted by this assay, and inadequate number of viral copies(<138 copies/mL). A negative result must be combined with clinical observations, patient history, and epidemiological information. The expected result is Negative.  Fact Sheet for Patients:  EntrepreneurPulse.com.au  Fact Sheet for Healthcare Providers:  IncredibleEmployment.be  This test is no t yet approved or cleared by the [place] FDA and  has been authorized for detection and/or diagnosis of SARS-CoV-2 by FDA under an Emergency Use Authorization (EUA). This EUA will remain  in effect (meaning this test can be used) for the duration of the COVID-19 declaration under Section 564(b)(1) of the Act, 21 U.S.C.section 360bbb-3(b)(1), unless the authorization is terminated  or revoked sooner.       Influenza A by PCR NEGATIVE NEGATIVE Final   Influenza B by PCR NEGATIVE NEGATIVE Final    Comment: (NOTE) The Xpert Xpress SARS-CoV-2/FLU/RSV plus assay is intended as an aid in the diagnosis of influenza from Nasopharyngeal swab specimens and should not be used as a sole basis for treatment.  Nasal washings and aspirates are unacceptable for Xpert Xpress SARS-CoV-2/FLU/RSV testing.  Fact Sheet for Patients: EntrepreneurPulse.com.au  Fact Sheet for Healthcare Providers: IncredibleEmployment.be  This test is not yet approved or cleared by the [place] FDA and has been authorized for detection and/or diagnosis of SARS-CoV-2 by FDA under an Emergency Use Authorization (EUA). This EUA will remain in effect (meaning this test can be used) for the duration of the COVID-19 declaration under Section 564(b)(1) of the Act, 21 U.S.C. section 360bbb-3(b)(1), unless the authorization is terminated or revoked.  Performed at [facility], [place] [address], [place], [place] [address]   Gastrointestinal Panel by PCR , Stool     Status: None   Collection Time: 05/03/21 10:30 PM   Specimen: Rectum; Stool  Result Value Ref Range Status   Campylobacter species NOT DETECTED NOT DETECTED Final   Plesimonas shigelloides NOT DETECTED NOT DETECTED Final   Salmonella species NOT DETECTED NOT DETECTED Final   Yersinia enterocolitica NOT DETECTED NOT DETECTED Final   Vibrio species NOT DETECTED NOT DETECTED Final   Vibrio cholerae NOT DETECTED NOT DETECTED Final   Enteroaggregative E coli (EAEC) NOT DETECTED NOT DETECTED Final   Enteropathogenic E coli (EPEC) NOT DETECTED NOT DETECTED Final   Enterotoxigenic E coli (ETEC) NOT DETECTED NOT DETECTED Final   Shiga like toxin producing E coli (STEC) NOT DETECTED NOT DETECTED Final   Shigella/Enteroinvasive E coli (EIEC) NOT DETECTED NOT DETECTED Final   Cryptosporidium NOT DETECTED NOT DETECTED Final   Cyclospora cayetanensis NOT DETECTED NOT DETECTED Final   Entamoeba histolytica NOT DETECTED NOT DETECTED Final   Giardia lamblia NOT DETECTED NOT DETECTED Final   Adenovirus F40/41 NOT DETECTED NOT DETECTED Final   Astrovirus NOT DETECTED NOT DETECTED Final   Norovirus GI/GII NOT DETECTED  NOT DETECTED Final   Rotavirus A NOT DETECTED NOT DETECTED Final   Sapovirus (I, II, IV, and V) NOT DETECTED NOT DETECTED Final    Comment: Performed at [facility], [address] [place], [place] [address]  C Difficile Quick Screen w PCR reflex     Status: None   Collection Time: 05/03/21 10:30 PM   Specimen: Rectum; Stool  Result Value Ref Range Status   C Diff antigen NEGATIVE NEGATIVE Final   C Diff toxin NEGATIVE NEGATIVE Final   C Diff interpretation No C. difficile detected.  Final    Comment: Performed at [facility], [place] [address], [place], [place] [address]     Labs: BNP (last 3 results) No results for input(s): BNP in the last 8760 hours. Basic Metabolic Panel: Recent Labs  Lab 05/03/21 1132 05/04/21 0500 05/05/21 0516  NA 133* 138 141  K 2.5* 2.9* 3.7  CL 98 107 116*  CO2 21* 24 19*  GLUCOSE 182* 120* 140*  BUN 42* 33* 24*  CREATININE 2.38* 1.98* 1.50*  CALCIUM 9.2 8.5* 8.4*  MG 1.8 1.7 1.5*   Liver Function Tests:  Recent Labs  Lab 05/03/21 1132  AST 18  ALT 19  ALKPHOS 73  BILITOT 0.4  PROT 8.4*  ALBUMIN 4.3   Recent Labs  Lab 05/03/21 1132  LIPASE 57*   No results for input(s): AMMONIA in the last 168 hours. CBC: Recent Labs  Lab 05/03/21 1132 05/04/21 0500 05/05/21 0516  WBC 18.5* 15.1* 15.8*  HGB 15.4* 12.7 11.8*  HCT 45.8 37.6 36.1  MCV 87.2 88.5 91.6  PLT 423* 310 268   Cardiac Enzymes: No results for input(s): CKTOTAL, CKMB, CKMBINDEX, TROPONINI in the last 168 hours. BNP: Invalid input(s): POCBNP CBG: Recent Labs  Lab 05/04/21 0758 05/04/21 1153 05/04/21 1747 05/04/21 2130 05/05/21 0728  GLUCAP 100* 108* 146* 170* 128*   D-Dimer No results for input(s): DDIMER in the last 72 hours. Hgb A1c Recent Labs    05/03/21 1843  HGBA1C 6.8*   Lipid Profile No results for input(s): CHOL, HDL, LDLCALC, TRIG, CHOLHDL, LDLDIRECT in the last 72 hours. Thyroid function studies No results for  input(s): TSH, T4TOTAL, T3FREE, THYROIDAB in the last 72 hours.  Invalid input(s): FREET3 Anemia work up No results for input(s): VITAMINB12, FOLATE, FERRITIN, TIBC, IRON, RETICCTPCT in the last 72 hours. Urinalysis    Component Value Date/Time   COLORURINE YELLOW 05/03/2021 1132   APPEARANCEUR CLEAR 05/03/2021 1132   LABSPEC 1.019 05/03/2021 1132   PHURINE 6.0 05/03/2021 1132   GLUCOSEU NEGATIVE 05/03/2021 1132   HGBUR NEGATIVE 05/03/2021 [place] 05/03/2021 1132   [place] 05/03/2021 1132   PROTEINUR 30 (A) 05/03/2021 1132   UROBILINOGEN 0.2 05/16/2012 1226   NITRITE NEGATIVE 05/03/2021 1132   LEUKOCYTESUR TRACE (A) 05/03/2021 1132   Sepsis Labs Invalid input(s): PROCALCITONIN,  WBC,  LACTICIDVEN Microbiology Recent Results (from the past 240 hour(s))  Resp Panel by RT-PCR (Flu A&B, Covid) Nasopharyngeal Swab     Status: None   Collection Time: 05/03/21  2:40 PM   Specimen: Nasopharyngeal Swab; Nasopharyngeal(NP) swabs in vial transport medium  Result Value Ref Range Status   SARS Coronavirus 2 by RT PCR NEGATIVE NEGATIVE Final    Comment: (NOTE) SARS-CoV-2 target nucleic acids are NOT DETECTED.  The SARS-CoV-2 RNA is generally detectable in upper respiratory specimens during the acute phase of infection. The lowest concentration of SARS-CoV-2 viral copies this assay can detect is 138 copies/mL. A negative result does not preclude SARS-Cov-2 infection and should not be used as the sole basis for treatment or other patient management decisions. A negative result may occur with  improper specimen collection/handling, submission of specimen other than nasopharyngeal swab, presence of viral mutation(s) within the areas targeted by this assay, and inadequate number of viral copies(<138 copies/mL). A negative result must be combined with clinical observations, patient history, and epidemiological information. The expected result is  Negative.  Fact Sheet for Patients:  EntrepreneurPulse.com.au  Fact Sheet for Healthcare Providers:  IncredibleEmployment.be  This test is no t yet approved or cleared by the [place] FDA and  has been authorized for detection and/or diagnosis of SARS-CoV-2 by FDA under an Emergency Use Authorization (EUA). This EUA will remain  in effect (meaning this test can be used) for the duration of the COVID-19 declaration under Section 564(b)(1) of the Act, 21 U.S.C.section 360bbb-3(b)(1), unless the authorization is terminated  or revoked sooner.       Influenza A by PCR NEGATIVE NEGATIVE Final   Influenza B by PCR NEGATIVE NEGATIVE Final    Comment: (NOTE) The Xpert Xpress  SARS-CoV-2/FLU/RSV plus assay is intended as an aid in the diagnosis of influenza from Nasopharyngeal swab specimens and should not be used as a sole basis for treatment. Nasal washings and aspirates are unacceptable for Xpert Xpress SARS-CoV-2/FLU/RSV testing.  Fact Sheet for Patients: EntrepreneurPulse.com.au  Fact Sheet for Healthcare Providers: IncredibleEmployment.be  This test is not yet approved or cleared by the [place] FDA and has been authorized for detection and/or diagnosis of SARS-CoV-2 by FDA under an Emergency Use Authorization (EUA). This EUA will remain in effect (meaning this test can be used) for the duration of the COVID-19 declaration under Section 564(b)(1) of the Act, 21 U.S.C. section 360bbb-3(b)(1), unless the authorization is terminated or revoked.  Performed at [facility], [place] [address], [place], [place] [address]   Gastrointestinal Panel by PCR , Stool     Status: None   Collection Time: 05/03/21 10:30 PM   Specimen: Rectum; Stool  Result Value Ref Range Status   Campylobacter species NOT DETECTED NOT DETECTED Final   Plesimonas shigelloides NOT DETECTED NOT DETECTED Final    Salmonella species NOT DETECTED NOT DETECTED Final   Yersinia enterocolitica NOT DETECTED NOT DETECTED Final   Vibrio species NOT DETECTED NOT DETECTED Final   Vibrio cholerae NOT DETECTED NOT DETECTED Final   Enteroaggregative E coli (EAEC) NOT DETECTED NOT DETECTED Final   Enteropathogenic E coli (EPEC) NOT DETECTED NOT DETECTED Final   Enterotoxigenic E coli (ETEC) NOT DETECTED NOT DETECTED Final   Shiga like toxin producing E coli (STEC) NOT DETECTED NOT DETECTED Final   Shigella/Enteroinvasive E coli (EIEC) NOT DETECTED NOT DETECTED Final   Cryptosporidium NOT DETECTED NOT DETECTED Final   Cyclospora cayetanensis NOT DETECTED NOT DETECTED Final   Entamoeba histolytica NOT DETECTED NOT DETECTED Final   Giardia lamblia NOT DETECTED NOT DETECTED Final   Adenovirus F40/41 NOT DETECTED NOT DETECTED Final   Astrovirus NOT DETECTED NOT DETECTED Final   Norovirus GI/GII NOT DETECTED NOT DETECTED Final   Rotavirus A NOT DETECTED NOT DETECTED Final   Sapovirus (I, II, IV, and V) NOT DETECTED NOT DETECTED Final    Comment: Performed at [facility], [place] [place], [place] [address]  C Difficile Quick Screen w PCR reflex     Status: None   Collection Time: 05/03/21 10:30 PM   Specimen: Rectum; Stool  Result Value Ref Range Status   C Diff antigen NEGATIVE NEGATIVE Final   C Diff toxin NEGATIVE NEGATIVE Final   C Diff interpretation No C. difficile detected.  Final    Comment: Performed at [facility], [place] [address], [place], [place] [address]     Time coordinating discharge: 35 minutes  SIGNED:   Aline August, MD  [facility] 05/05/2021, 10:08 AM

## 2021-05-05 NOTE — Progress Notes (Signed)
Clinically, the patient is doing quite well and feels ready to go home.  The frequency of her bowel movements (approximately 5 or 6 in the past 24 hours) is somewhat increased from her normal baseline but globally she feels better, is not having flatulence, and is okay with going home.  Labs are improved, with potassium having come up from an admission level of 2.5 to a current level of 3.7, and with her creatinine having fallen from 2.38 to a current level of 1.50.  (Baseline creatinine not known, but the patient thinks she runs a slightly high creatinine due to chronic kidney disease.)  She remains afebrile.  Impression: Transient exacerbation of diarrhea (superimposed on baseline chronic moderate diarrhea as a consequence of her previous Crohn's resections).  Etiology of this exacerbation is not clear.  I wonder if it could have been a case of Salmonella from contaminated peanut butter, which she did consume, even though her stool study was negative for that organism.  Recommendations:  1.  Okay for discharge today (discussed with patient's attending hospitalist physician)  2.  I would probably not continue antibiotics following discharge, since we do not have a documented infection and the patient has never been "toxic" I think the benefit of continued antibiotics is questionable and there is a good chance it could actually exacerbate her diarrhea.  3.  I have recommended that the patient use Florastor probiotic therapy 1 capsule twice a day for the next couple of weeks.  4.  The patient knows to call me if she gets a worsening of bowel function following discharge, in which case I might resume her antibiotics.  5.  Office follow-up with me is not required as long as she does well following discharge.  Follow-up with me can be PRN.  Cleotis Nipper, M.D. Pager [phone] If no answer or after 5 PM call [phone]

## 2021-05-05 NOTE — Progress Notes (Signed)
AVS reviewed with patient. All questions answered.  IV access removed. Dressing applied.  Tele removed. All pt belongings with pt. Pt awaiting sister in law for transport home.

## 2021-05-11 ENCOUNTER — Inpatient Hospital Stay (HOSPITAL_COMMUNITY)
Admission: EM | Admit: 2021-05-11 | Discharge: 2021-05-14 | DRG: 565 | Disposition: A | Discharge status: Home Health | Payer: Medicare PPO | Attending: Internal Medicine | Admitting: Internal Medicine

## 2021-05-11 ENCOUNTER — Encounter (HOSPITAL_COMMUNITY): Payer: Self-pay | Admitting: *Deleted

## 2021-05-11 ENCOUNTER — Emergency Department (HOSPITAL_COMMUNITY): Payer: Medicare PPO

## 2021-05-11 ENCOUNTER — Other Ambulatory Visit: Payer: Self-pay

## 2021-05-11 DIAGNOSIS — Z886 Allergy status to analgesic agent status: Secondary | ICD-10-CM

## 2021-05-11 DIAGNOSIS — Z7951 Long term (current) use of inhaled steroids: Secondary | ICD-10-CM | POA: Diagnosis not present

## 2021-05-11 DIAGNOSIS — R52 Pain, unspecified: Secondary | ICD-10-CM

## 2021-05-11 DIAGNOSIS — M7989 Other specified soft tissue disorders: Secondary | ICD-10-CM | POA: Diagnosis not present

## 2021-05-11 DIAGNOSIS — N179 Acute kidney failure, unspecified: Secondary | ICD-10-CM | POA: Diagnosis present

## 2021-05-11 DIAGNOSIS — Z8619 Personal history of other infectious and parasitic diseases: Secondary | ICD-10-CM

## 2021-05-11 DIAGNOSIS — E876 Hypokalemia: Secondary | ICD-10-CM | POA: Diagnosis not present

## 2021-05-11 DIAGNOSIS — E785 Hyperlipidemia, unspecified: Secondary | ICD-10-CM | POA: Diagnosis not present

## 2021-05-11 DIAGNOSIS — E1122 Type 2 diabetes mellitus with diabetic chronic kidney disease: Secondary | ICD-10-CM | POA: Diagnosis not present

## 2021-05-11 DIAGNOSIS — Z9049 Acquired absence of other specified parts of digestive tract: Secondary | ICD-10-CM

## 2021-05-11 DIAGNOSIS — R6510 Systemic inflammatory response syndrome (SIRS) of non-infectious origin without acute organ dysfunction: Secondary | ICD-10-CM

## 2021-05-11 DIAGNOSIS — Z85820 Personal history of malignant melanoma of skin: Secondary | ICD-10-CM | POA: Diagnosis not present

## 2021-05-11 DIAGNOSIS — M25512 Pain in left shoulder: Secondary | ICD-10-CM | POA: Diagnosis not present

## 2021-05-11 DIAGNOSIS — Z880 Allergy status to penicillin: Secondary | ICD-10-CM

## 2021-05-11 DIAGNOSIS — Z981 Arthrodesis status: Secondary | ICD-10-CM

## 2021-05-11 DIAGNOSIS — R5381 Other malaise: Secondary | ICD-10-CM | POA: Diagnosis not present

## 2021-05-11 DIAGNOSIS — K5090 Crohn's disease, unspecified, without complications: Secondary | ICD-10-CM | POA: Diagnosis present

## 2021-05-11 DIAGNOSIS — M1990 Unspecified osteoarthritis, unspecified site: Secondary | ICD-10-CM | POA: Diagnosis present

## 2021-05-11 DIAGNOSIS — Z8249 Family history of ischemic heart disease and other diseases of the circulatory system: Secondary | ICD-10-CM

## 2021-05-11 DIAGNOSIS — I129 Hypertensive chronic kidney disease with stage 1 through stage 4 chronic kidney disease, or unspecified chronic kidney disease: Secondary | ICD-10-CM | POA: Diagnosis present

## 2021-05-11 DIAGNOSIS — Z8673 Personal history of transient ischemic attack (TIA), and cerebral infarction without residual deficits: Secondary | ICD-10-CM

## 2021-05-11 DIAGNOSIS — M2540 Effusion, unspecified joint: Secondary | ICD-10-CM

## 2021-05-11 DIAGNOSIS — F32A Depression, unspecified: Secondary | ICD-10-CM | POA: Diagnosis present

## 2021-05-11 DIAGNOSIS — N1831 Chronic kidney disease, stage 3a: Secondary | ICD-10-CM

## 2021-05-11 DIAGNOSIS — N183 Chronic kidney disease, stage 3 unspecified: Secondary | ICD-10-CM | POA: Diagnosis present

## 2021-05-11 DIAGNOSIS — M25462 Effusion, left knee: Secondary | ICD-10-CM | POA: Diagnosis not present

## 2021-05-11 DIAGNOSIS — Z20822 Contact with and (suspected) exposure to covid-19: Secondary | ICD-10-CM | POA: Diagnosis present

## 2021-05-11 DIAGNOSIS — K5000 Crohn's disease of small intestine without complications: Secondary | ICD-10-CM

## 2021-05-11 DIAGNOSIS — Z888 Allergy status to other drugs, medicaments and biological substances status: Secondary | ICD-10-CM

## 2021-05-11 DIAGNOSIS — D72829 Elevated white blood cell count, unspecified: Secondary | ICD-10-CM | POA: Diagnosis not present

## 2021-05-11 DIAGNOSIS — K7581 Nonalcoholic steatohepatitis (NASH): Secondary | ICD-10-CM | POA: Diagnosis present

## 2021-05-11 DIAGNOSIS — D631 Anemia in chronic kidney disease: Secondary | ICD-10-CM | POA: Diagnosis present

## 2021-05-11 DIAGNOSIS — Z7952 Long term (current) use of systemic steroids: Secondary | ICD-10-CM | POA: Diagnosis not present

## 2021-05-11 DIAGNOSIS — M25412 Effusion, left shoulder: Secondary | ICD-10-CM | POA: Diagnosis not present

## 2021-05-11 DIAGNOSIS — I10 Essential (primary) hypertension: Secondary | ICD-10-CM | POA: Diagnosis not present

## 2021-05-11 DIAGNOSIS — Z833 Family history of diabetes mellitus: Secondary | ICD-10-CM

## 2021-05-11 DIAGNOSIS — M25562 Pain in left knee: Secondary | ICD-10-CM | POA: Diagnosis not present

## 2021-05-11 DIAGNOSIS — M25561 Pain in right knee: Secondary | ICD-10-CM | POA: Diagnosis not present

## 2021-05-11 DIAGNOSIS — Z79899 Other long term (current) drug therapy: Secondary | ICD-10-CM

## 2021-05-11 DIAGNOSIS — M2550 Pain in unspecified joint: Secondary | ICD-10-CM | POA: Diagnosis not present

## 2021-05-11 DIAGNOSIS — M238X2 Other internal derangements of left knee: Secondary | ICD-10-CM | POA: Diagnosis not present

## 2021-05-11 HISTORY — PX: PR ARTHROCENTESIS ASPIR&/INJ MAJOR JT/BURSA W/O US: 20610

## 2021-05-11 LAB — CBC WITH DIFFERENTIAL/PLATELET
Abs Immature Granulocytes: 0.33 10*3/uL — ABNORMAL HIGH (ref 0.00–0.07)
Basophils Absolute: 0.1 10*3/uL (ref 0.0–0.1)
Basophils Relative: 0 %
Eosinophils Absolute: 0.2 10*3/uL (ref 0.0–0.5)
Eosinophils Relative: 1 %
HCT: 34.7 % — ABNORMAL LOW (ref 36.0–46.0)
Hemoglobin: 11.5 g/dL — ABNORMAL LOW (ref 12.0–15.0)
Immature Granulocytes: 1 %
Lymphocytes Relative: 8 %
Lymphs Abs: 2 10*3/uL (ref 0.7–4.0)
MCH: 29.1 pg (ref 26.0–34.0)
MCHC: 33.1 g/dL (ref 30.0–36.0)
MCV: 87.8 fL (ref 80.0–100.0)
Monocytes Absolute: 2.4 10*3/uL — ABNORMAL HIGH (ref 0.1–1.0)
Monocytes Relative: 10 %
Neutro Abs: 19 10*3/uL — ABNORMAL HIGH (ref 1.7–7.7)
Neutrophils Relative %: 80 %
Platelets: 254 10*3/uL (ref 150–400)
RBC: 3.95 MIL/uL (ref 3.87–5.11)
RDW: 14.8 % (ref 11.5–15.5)
WBC: 23.9 10*3/uL — ABNORMAL HIGH (ref 4.0–10.5)
nRBC: 0 % (ref 0.0–0.2)

## 2021-05-11 LAB — BASIC METABOLIC PANEL
Anion gap: 10 (ref 5–15)
BUN: 12 mg/dL (ref 8–23)
CO2: 30 mmol/L (ref 22–32)
Calcium: 8.5 mg/dL — ABNORMAL LOW (ref 8.9–10.3)
Chloride: 95 mmol/L — ABNORMAL LOW (ref 98–111)
Creatinine, Ser: 1.12 mg/dL — ABNORMAL HIGH (ref 0.44–1.00)
GFR, Estimated: 52 mL/min — ABNORMAL LOW (ref >60)
Glucose, Bld: 130 mg/dL — ABNORMAL HIGH (ref 70–99)
Potassium: 2 mmol/L — CL (ref 3.5–5.1)
Sodium: 135 mmol/L (ref 135–145)

## 2021-05-11 LAB — POC SARS CORONAVIRUS 2 AG -  ED: SARSCOV2ONAVIRUS 2 AG: NEGATIVE

## 2021-05-11 MED ORDER — VANCOMYCIN HCL IN DEXTROSE 1-5 GM/200ML-% IV SOLN
1000.0000 mg | Freq: Once | INTRAVENOUS | Status: AC
  Administered 2021-05-12: 1000 mg via INTRAVENOUS
  Filled 2021-05-11: qty 200

## 2021-05-11 MED ORDER — SODIUM CHLORIDE 0.9 % IV SOLN
2.0000 g | Freq: Once | INTRAVENOUS | Status: AC
  Administered 2021-05-11: 2 g via INTRAVENOUS
  Filled 2021-05-11: qty 20

## 2021-05-11 MED ORDER — POTASSIUM CHLORIDE 10 MEQ/100ML IV SOLN
10.0000 meq | INTRAVENOUS | Status: AC
  Administered 2021-05-12 (×3): 10 meq via INTRAVENOUS
  Filled 2021-05-11 (×3): qty 100

## 2021-05-11 MED ORDER — POTASSIUM CHLORIDE 10 MEQ/100ML IV SOLN
10.0000 meq | INTRAVENOUS | Status: AC
  Administered 2021-05-11 (×3): 10 meq via INTRAVENOUS
  Filled 2021-05-11 (×3): qty 100

## 2021-05-11 NOTE — ED Provider Notes (Signed)
Emergency Medicine Provider Triage Evaluation Note  Alexis Ramos , a 70 y.o. female  was evaluated in triage.  Pt complains of left knee pain and leukocytosis.  She does ago started experiencing swelling of the left knee and trouble walking.  Reports pain with range of motion as well.  Denies any tenderness to palpation.  Orthopedist was concerned that she may have a septic joint but no history of this in the past.  Had a fever yesterday.  Review of Systems  Positive: Left knee pain, fever Negative: Numbness or weakness  Physical Exam  BP (!) 148/78 (BP Location: Left Arm)   Pulse 78   Temp 98.7 F (37.1 C) (Oral)   Resp 18   SpO2 95%  Gen:   Awake, no distress   Resp:  Normal effort  MSK:   Moves extremities without difficulty  Other:  Swelling of the left knee with limited range of motion  Medical Decision Making  Medically screening exam initiated at 5:25 PM.  Appropriate orders placed.  Alexis Ramos was informed that the remainder of the evaluation will be completed by another provider, this initial triage assessment does not replace that evaluation, and the importance of remaining in the ED until their evaluation is complete.  Unable to review labs from orthopedist office today so we will reorder as well as imaging   Delia Heady, PA-C 05/11/21 1726    Wyvonnia Dusky, MD 05/12/21 1050

## 2021-05-11 NOTE — ED Provider Notes (Signed)
[facility] DEPT Provider Note   CSN: [id number] Arrival date & time: 05/11/21  1646     History Chief Complaint  Patient presents with  . Knee Pain  . Abnormal Lab    Alexis Ramos is a 70 y.o. female w/ hx of salmonella enteritis 1 week ago, treated with cipro + flagyl, here with fever, knee and shoulder pain.  Reports onset of symptoms 3-4 days ago, fever to 101F at home 2 days ago.  Went to [facility] today because she was having pain and swelling and warmth in her left knee and left shoulder.  Noted to have WBC of 24K there and advised to come to the ED for evaluation for joint infection.    HPI     Past Medical History:  Diagnosis Date  . Arthritis   . Cancer ([place])    melanoma on back   . Crohn's disease ([place])   . Depression   . DES exposure in utero   . Diabetes ([place])    type 2  . Forgetfulness   . Hyperlipidemia   . Hypertension   . Kidney stones   . Menopausal symptoms   . SBO (small bowel obstruction) ([place]) 06/2011, 11/2018   tx w/steroids  . Snoring     Patient Active Problem List   Diagnosis Date Noted  . SIRS (systemic inflammatory response syndrome) ([place]) 05/11/2021  . Colitis 05/04/2021  . AKI (acute kidney injury) ([place]) 05/03/2021  . Colitis presumed infectious 05/03/2021  . Hypokalemia 05/03/2021  . Crohn's disease involving terminal ileum ([place]) 12/07/2018  . Depression 12/07/2018  . Nonalcoholic steatohepatitis 64/40/3474  . Chronic cholecystitis without calculus s/p lap cholecystectomy 08/10/2016 08/10/2016  . Chronic kidney disease, stage III (moderate) ([place]) 03/27/2015  . Dyslipidemia 03/25/2015  . History of resection of small bowel 03/25/2015  . DES exposure in utero   . Hypertension   . Crohn's disease ([place])   . Menopausal symptoms     Past Surgical History:  Procedure Laterality Date  . Anal abscess surg    . anal sphincterectomy    . APPENDECTOMY  1970  . [place]    small, x 3  . BREAST BIOPSY Left   . BREAST SURGERY     Biopsy-Benign  . CERVICAL FUSION  05/2009   C4-C7  . CHOLECYSTECTOMY N/A 08/10/2016   Procedure: LAPAROSCOPIC CHOLECYSTECTOMY WITH INTRAOPERATIVE CHOLANGIOGRAM -STANDARD 4 PORT;  Surgeon: [name], MD;  Location: [facility] ORS;  Service: General;  Laterality: N/A;  . [place] AND CURETTAGE OF UTERUS  1984  . HAND SURGERY    . ILEOCECETOMY  1971   Crohns disease flare  . [place]   x2  . KNEE SURGERY Left 1998  . LAPAROSCOPIC LYSIS OF ADHESIONS N/A 08/10/2016   Procedure: LAPAROSCOPIC LYSIS OF ADHESIONS WITH CORE LIVER BIOPSY;  Surgeon: [name], MD;  Location: [facility];  Service: General;  Laterality: N/A;  . LIPOSUCTION  2004   neck  . NASAL SINUS SURGERY  1995  . PR DRAIN/INJECT LARGE JOINT/BURSA  05/11/2021      . SKIN CANCER EXCISION  03/2016   melanoma, back  . SMALL INTESTINE SURGERY  1985   Crohn stricture  . SMALL INTESTINE SURGERY  1996   Crohn stricture  . Thumb surg    . TONGUE BASE REDUCTION SOMNOPLASTY  2000   Removed uvula  . TONSILLECTOMY AND ADENOIDECTOMY  1959  . [place]  OB History    Gravida  0   Para      Term      Preterm      AB      Living        SAB      IAB      Ectopic      Multiple      Live Births              Family History  Problem Relation Age of Onset  . Diabetes Mother   . Hypertension Mother   . Osteoporosis Mother   . Hypertension Father   . Heart disease Father   . Prostate cancer Father   . Diabetes Brother   . Hypertension Brother   . Cancer Other        Niece-Glioblastoma  . Breast cancer Neg Hx     Social History   Tobacco Use  . Smoking status: Never Smoker  . Smokeless tobacco: Never Used  Vaping Use  . Vaping Use: Never used  Substance Use Topics  . Alcohol use: No  . Drug use: No    Home Medications Prior to Admission medications   Medication Sig Start Date End Date Taking? Authorizing  Provider  amLODipine (NORVASC) 5 MG tablet Take 5 mg by mouth daily. 02/28/16   [provider]  budesonide (ENTOCORT EC) 3 MG 24 hr capsule Take 3 mg by mouth every morning. 03/10/21   [provider]  Cholecalciferol (VITAMIN D-3) 5000 units TABS Take 5,000 Units by mouth daily.    [provider]  cyanocobalamin (,VITAMIN B-12,) 1000 MCG/ML injection Inject 1,000 mcg into the muscle every 21 ( twenty-one) days. 02/19/21   [provider]  fenofibrate 54 MG tablet Take 54 mg by mouth daily. 02/10/21   [provider]  losartan (COZAAR) 100 MG tablet Take 100 mg by mouth daily. 05/14/19   [provider]  metoprolol succinate (TOPROL-XL) 100 MG 24 hr tablet Take 50 mg by mouth 2 (two) times daily.    [provider]  MITIGARE 0.6 MG CAPS Take 0.6 capsules by mouth daily as needed (gout). 04/08/21   [provider]  Multiple Vitamins-Minerals (PRESERVISION AREDS) CAPS Take 1 capsule by mouth daily.    [provider]  omeprazole (PRILOSEC) 20 MG capsule Take 20 mg by mouth daily as needed (heartburn). 05/11/19   [provider]  pioglitazone (ACTOS) 15 MG tablet Take 15 mg by mouth daily. 04/05/21   [provider]  saccharomyces boulardii (FLORASTOR) 250 MG capsule Take 1 capsule (250 mg total) by mouth 2 (two) times daily. 05/05/21   Aline August, MD  triamcinolone (NASACORT) 55 MCG/ACT AERO nasal inhaler Place 1 spray into the nose daily as needed (allergy).    [provider]  venlafaxine (EFFEXOR) 75 MG tablet Take 150-225 mg by mouth See admin instructions. Takes 225 mg in the morning and 150 mg at bedtime 05/16/16   [provider]  Vitamin E 400 units TABS Take 800 Units by mouth daily.    [provider]    Allergies    Nsaids, Penicillins, Advil [ibuprofen], Asa [aspirin], and Daypro [oxaprozin]  Review of Systems   Review of Systems  Constitutional: Positive for chills  and fever.  Respiratory: Negative for cough and shortness of breath.   Cardiovascular: Negative for chest pain and palpitations.  Gastrointestinal: Negative for abdominal pain and vomiting.  Musculoskeletal: Positive for arthralgias and myalgias.  Skin:  Negative for color change and rash.  Neurological: Positive for headaches. Negative for syncope.  All other systems reviewed and are negative.   Physical Exam Updated Vital Signs BP (!) 155/77 (BP Location: Right Arm)   Pulse 74   Temp 98.9 F (37.2 C) (Oral)   Resp 13   SpO2 96%   Physical Exam Constitutional:      General: She is not in acute distress. HENT:     Head: Normocephalic and atraumatic.  Eyes:     Conjunctiva/sclera: Conjunctivae normal.     Pupils: Pupils are equal, round, and reactive to light.  Cardiovascular:     Rate and Rhythm: Normal rate and regular rhythm.     Pulses: Normal pulses.  Pulmonary:     Effort: Pulmonary effort is normal. No respiratory distress.  Abdominal:     General: There is no distension.     Tenderness: There is no abdominal tenderness.  Skin:    General: Skin is warm and dry.     Comments: Warmth, swelling and tenderness of left knee Patient can passively range knee to approx 45 degrees Tenderness and pain in left shoulder, milder, patient has full ROM of the shoulder  Neurological:     General: No focal deficit present.     Mental Status: She is alert. Mental status is at baseline.  Psychiatric:        Mood and Affect: Mood normal.        Behavior: Behavior normal.     ED Results / Procedures / Treatments   Labs (all labs ordered are listed, but only abnormal results are displayed) Labs Reviewed  BASIC METABOLIC PANEL - Abnormal; Notable for the following components:      Result Value   Potassium 2.0 (*)    Chloride 95 (*)    Glucose, Bld 130 (*)    Creatinine, Ser 1.12 (*)    Calcium 8.5 (*)    GFR, Estimated 52 (*)    All other components within normal limits   CBC WITH DIFFERENTIAL/PLATELET - Abnormal; Notable for the following components:   WBC 23.9 (*)    Hemoglobin 11.5 (*)    HCT 34.7 (*)    Neutro Abs 19.0 (*)    Monocytes Absolute 2.4 (*)    Abs Immature Granulocytes 0.33 (*)    All other components within normal limits  MAGNESIUM - Abnormal; Notable for the following components:   Magnesium 1.2 (*)    All other components within normal limits  SYNOVIAL CELL COUNT + DIFF, W/ CRYSTALS - Abnormal; Notable for the following components:   Appearance-Synovial TURBID (*)    WBC, Synovial 15,180 (*)    Neutrophil, Synovial 50 (*)    Monocyte-Macrophage-Synovial Fluid 46 (*)    All other components within normal limits  URINALYSIS, ROUTINE W REFLEX MICROSCOPIC - Abnormal; Notable for the following components:   Color, Urine STRAW (*)    Protein, ur 30 (*)    Bacteria, UA RARE (*)    All other components within normal limits  BASIC METABOLIC PANEL - Abnormal; Notable for the following components:   Potassium 2.2 (*)    Chloride 96 (*)    Glucose, Bld 118 (*)    Creatinine, Ser 1.14 (*)    Calcium 8.6 (*)    GFR, Estimated 51 (*)    All other components within normal limits  BASIC METABOLIC PANEL - Abnormal; Notable for the following components:   Potassium 2.5 (*)    Glucose, Bld  125 (*)    Calcium 7.4 (*)    All other components within normal limits  CBC WITH DIFFERENTIAL/PLATELET - Abnormal; Notable for the following components:   WBC 18.1 (*)    RBC 3.55 (*)    Hemoglobin 10.3 (*)    HCT 31.2 (*)    Neutro Abs 14.8 (*)    Monocytes Absolute 1.5 (*)    Abs Immature Granulocytes 0.19 (*)    All other components within normal limits  COMPREHENSIVE METABOLIC PANEL - Abnormal; Notable for the following components:   Potassium 2.3 (*)    Glucose, Bld 155 (*)    Calcium 7.8 (*)    Total Protein 6.4 (*)    Albumin 2.6 (*)    AST 11 (*)    All other components within normal limits  MAGNESIUM - Abnormal; Notable for the  following components:   Magnesium 1.3 (*)    All other components within normal limits  CBG MONITORING, ED - Abnormal; Notable for the following components:   Glucose-Capillary 133 (*)    All other components within normal limits  CULTURE, BLOOD (ROUTINE X 2)  CULTURE, BLOOD (ROUTINE X 2)  BODY FLUID CULTURE  URINE CULTURE  CK  LACTIC ACID, PLASMA  PROCALCITONIN  HEMOGLOBIN A1C  POTASSIUM  MAGNESIUM  POC SARS CORONAVIRUS 2 AG -  ED    EKG None  Radiology DG CHEST PORT 1 VIEW  Result Date: 05/12/2021 CLINICAL DATA:  Body aches EXAM: PORTABLE CHEST 1 VIEW COMPARISON:  01/08/2019 FINDINGS: The heart size and mediastinal contours are within normal limits. Both lungs are clear. Surgical hardware in the cervical spine. IMPRESSION: No active disease. Electronically Signed   By: Donavan Foil M.D.   On: 05/12/2021 01:16   DG Shoulder Left  Result Date: 05/11/2021 CLINICAL DATA:  Left shoulder pain for several weeks, recent arthrocentesis EXAM: LEFT SHOULDER - 2+ VIEW COMPARISON:  None. FINDINGS: Frontal and transscapular views of the left shoulder demonstrate no acute displaced fractures. Moderate hypertrophic changes are seen at the acromion process. Remaining joint spaces are well preserved. Left chest is clear. IMPRESSION: 1. Prominent spurring of the acromion process. No acute bony abnormality. Electronically Signed   By: Randa Ngo M.D.   On: 05/11/2021 20:28   DG Knee Complete 4 Views Left  Result Date: 05/11/2021 CLINICAL DATA:  70 year old female with left knee swelling. EXAM: LEFT KNEE - COMPLETE 4+ VIEW COMPARISON:  None. FINDINGS: There is no acute fracture or dislocation. The bones are osteopenic. There is mild arthritic changes with meniscal chondrocalcinosis. There is moderate narrowing of the patellofemoral compartment with spurring. Moderate size suprapatellar effusion noted. The soft tissues are otherwise unremarkable. IMPRESSION: 1. No acute fracture or dislocation. 2.  Osteopenia with mild arthritic changes and meniscal chondrocalcinosis. 3. Moderate suprapatellar effusion. Electronically Signed   By: Anner Crete M.D.   On: 05/11/2021 18:04    Procedures .Critical Care Performed by: Wyvonnia Dusky, MD Authorized by: Wyvonnia Dusky, MD   Critical care provider statement:    Critical care time (minutes):  35   Critical care was necessary to treat or prevent imminent or life-threatening deterioration of the following conditions:  Metabolic crisis   Critical care was time spent personally by me on the following activities:  Discussions with consultants, evaluation of patient's response to treatment, examination of patient, ordering and performing treatments and interventions, ordering and review of laboratory studies, ordering and review of radiographic studies, pulse oximetry, re-evaluation of patient's condition, obtaining  history from patient or surrogate and review of old charts Comments:     Hypokalemia, IV K     Medications Ordered in ED Medications  ceFEPIme (MAXIPIME) 2 g in sodium chloride 0.9 % 100 mL IVPB (0 g Intravenous Stopped 05/12/21 0650)  insulin aspart (novoLOG) injection 0-9 Units (0 Units Subcutaneous Not Given 05/12/21 0818)  magnesium sulfate IVPB 4 g 100 mL (4 g Intravenous New Bag/Given 05/12/21 1016)  potassium chloride SA (KLOR-CON) CR tablet 40 mEq (40 mEq Oral Given 05/12/21 1015)  vancomycin (VANCOCIN) IVPB 1000 mg/200 mL premix (has no administration in time range)  cefTRIAXone (ROCEPHIN) 2 g in sodium chloride 0.9 % 100 mL IVPB (0 g Intravenous Stopped 05/11/21 2054)  potassium chloride 10 mEq in 100 mL IVPB (0 mEq Intravenous Stopped 05/12/21 0251)  vancomycin (VANCOCIN) IVPB 1000 mg/200 mL premix (0 mg Intravenous Stopped 05/12/21 0610)  potassium chloride 10 mEq in 100 mL IVPB (0 mEq Intravenous Stopped 05/12/21 0430)    ED Course  I have reviewed the triage vital signs and the nursing notes.  Pertinent labs & imaging  results that were available during my care of the patient were reviewed by me and considered in my medical decision making (see chart for details).  70 yo female here with fevers, joint swelling in setting of salmonella enteritis infection 1 week ago - managed in the hospital at that time.  WBC is 24K.  Clinically she has concern for a possible knee joint infection.  Orthopedics was consulted as noted below and presented to perform a bedside arthrocentesis of the knee and left shoulder.  Labs also notable for K 2.0.  Mag added on and pending.  IV K ordered for repletion.  Her tele does not show prolonged arrhythmias.  She is hemodynamically stable and overall clinically well appearing in the ED.  She will need admission for sepsis/bacteremia rule out.  IV vanco & rocephin ordered for coverage.  Patient verbalized understanding of the plan.  Clinical Course as of 05/12/21 1049  Tue May 11, 2021  1904 I spoke to Dr Rolena Infante from [facility] who will come evaluate the patient, anticipating taking to the OR for a washout. [MT]  1905 He's asked for a rapid 15 minute covid swab - i've asked patient's nurse to obtain this [MT]  2000 Dr Rolena Infante has performed knee aspiration, yellow nonpurulent fluid.  Left shoulder dry tap.  He recommends medical admission for possible bacteremia management with IV antibiotics, and hypokalemia.  If aspiration labs return positive for infection, Ortho team to consider operative washout tomorrow. [MT]    Clinical Course User Index [MT] Mila Pair, Carola Rhine, MD    Final Clinical Impression(s) / ED Diagnoses Final diagnoses:  Hypokalemia  Joint swelling    Rx / DC Orders ED Discharge Orders    None       Wyvonnia Dusky, MD 05/12/21 1049

## 2021-05-11 NOTE — H&P (Signed)
Alexis Ramos UXN:[id number] DOB: [birth year] DOA: 05/11/2021    PCP: [name], MD   Outpatient Specialists:     GI Dr. Cristina Gong Sadie [facility]er )   Patient arrived to ER on 05/11/21 at 1646 Referred by Attending Trifan, Carola Rhine, MD   Patient coming from: home Lives alone,      Chief Complaint:    Chief Complaint  Patient presents with  . Knee Pain  . Abnormal Lab    HPI: Alexis Ramos is a 70 y.o. female with medical history significant of Crohn's disease s/p ileocecectomy, disease , HTN, NASH, CKD, HLD, DM2, chornic diarrhea  TIA  Presented with   left knee pain and leukocytosis.  Came in in with fevers and chills Left knee pain Orthopedics have seen her in ER and tapped did not appear infected Last week ws with salmonella enteritis cipro/falgyl Admitted and was discharged on 5/25 Reports was doing well mowed the yeard and after developed severe body aches and fever on and off   Infectious risk factors:  Reports  Fever     Has been vaccinated against COVID     Initial COVID TEST    in house  PCR testing  Pending  Lab Results  Component Value Date   [place] NEGATIVE 05/03/2021    Regarding pertinent Chronic problems:      Hyperlipidemia -  on statins fenofibrate    HTN on NOrvasc, cozaar    DM 2 -  Lab Results  Component Value Date   HGBA1C 6.8 (H) 05/03/2021    diet controlled     Hx of TIA -  with/out residual deficits on allergic to aspirin   CKD stage IIIa- baseline Cr   Estimated Creatinine Clearance: 31 mL/min (A) (by C-G formula based on SCr of 1.12 mg/dL (H)).  Lab Results  Component Value Date   CREATININE 1.12 (H) 05/11/2021   CREATININE 1.50 (H) 05/05/2021   CREATININE 1.98 (H) 05/04/2021      Liver disease NASH stable followed by [facility]    Chronic anemia - baseline hg Hemoglobin & Hematocrit  Recent Labs    05/04/21 0500 05/05/21 0516 05/11/21 1803  HGB 12.7 11.8* 11.5*    While in ER: vanc and rocephine started Was  seen by orthopedics and had knee taped Blood cultures obtained     ED Triage Vitals  Enc Vitals Group     BP 05/11/21 1715 (!) 148/78     Pulse Rate 05/11/21 1715 80     Resp 05/11/21 1718 18     Temp 05/11/21 1718 98.7 F (37.1 C)     Temp Source 05/11/21 1718 Oral     SpO2 05/11/21 1715 96 %     Weight --      Height --      Head Circumference --      Peak Flow --      Pain Score 05/11/21 1719 8     Pain Loc --      Pain Edu? --      Excl. in [place]? --   TMAX(24)@     _________________________________________ Significant initial  Findings: Abnormal Labs Reviewed  BASIC METABOLIC PANEL - Abnormal; Notable for the following components:      Result Value   Potassium 2.0 (*)    Chloride 95 (*)    Glucose, Bld 130 (*)    Creatinine, Ser 1.12 (*)    Calcium 8.5 (*)    GFR, Estimated 52 (*)  All other components within normal limits  CBC WITH DIFFERENTIAL/PLATELET - Abnormal; Notable for the following components:   WBC 23.9 (*)    Hemoglobin 11.5 (*)    HCT 34.7 (*)    Neutro Abs 19.0 (*)    Monocytes Absolute 2.4 (*)    Abs Immature Granulocytes 0.33 (*)    All other components within normal limits  SYNOVIAL CELL COUNT + DIFF, W/ CRYSTALS - Abnormal; Notable for the following components:   Appearance-Synovial TURBID (*)    WBC, Synovial 15,180 (*)    All other components within normal limits   ____________________________________________ Ordered    CXR -  NON acute LEFt shoulder and Knee films neg     ECG: not Ordered   ____________________ This patient meets SIRS Criteria and may be septic.    The recent clinical data is shown below. Vitals:   05/11/21 2159 05/11/21 2200 05/11/21 2201 05/11/21 2230  BP:  (!) 164/77 (!) 164/77 (!) 145/121  Pulse:  79 72 77  Resp: (!) 26 18 (!) 21 18  Temp:      TempSrc:      SpO2:  99% 100% 99%    WBC      Component Value Date/Time   WBC 23.9 (H) 05/11/2021 1803   LYMPHSABS 2.0 05/11/2021 1803   MONOABS 2.4 (H)  05/11/2021 1803   EOSABS 0.2 05/11/2021 1803   BASOSABS 0.1 05/11/2021 1803      Procalcitonin 0.63     UA   Ordered     Gastrointestinal Panel by PCR , Stool     Status: None   Collection Time: 05/03/21 10:30 PM   Specimen: Rectum; Stool  Result Value Ref Range Status   Campylobacter species NOT DETECTED NOT DETECTED Final   Plesimonas shigelloides NOT DETECTED NOT DETECTED Final   Salmonella species NOT DETECTED NOT DETECTED Final   Yersinia enterocolitica NOT DETECTED NOT DETECTED Final   Vibrio species NOT DETECTED NOT DETECTED Final   Vibrio cholerae NOT DETECTED NOT DETECTED Final   Enteroaggregative E coli (EAEC) NOT DETECTED NOT DETECTED Final   Enteropathogenic E coli (EPEC) NOT DETECTED NOT DETECTED Final   Enterotoxigenic E coli (ETEC) NOT DETECTED NOT DETECTED Final   Shiga like toxin producing E coli (STEC) NOT DETECTED NOT DETECTED Final   Shigella/Enteroinvasive E coli (EIEC) NOT DETECTED NOT DETECTED Final   Cryptosporidium NOT DETECTED NOT DETECTED Final   Cyclospora cayetanensis NOT DETECTED NOT DETECTED Final   Entamoeba histolytica NOT DETECTED NOT DETECTED Final   Giardia lamblia NOT DETECTED NOT DETECTED Final   Adenovirus F40/41 NOT DETECTED NOT DETECTED Final   Astrovirus NOT DETECTED NOT DETECTED Final   Norovirus GI/GII NOT DETECTED NOT DETECTED Final   Rotavirus A NOT DETECTED NOT DETECTED Final   Sapovirus (I, II, IV, and V) NOT DETECTED NOT DETECTED Final    Comment: Performed at [facility], [address] [place], [place] [address]  C Difficile Quick Screen w PCR reflex     Status: None   Collection Time: 05/03/21 10:30 PM   Specimen: Rectum; Stool  Result Value Ref Range Status   C Diff antigen NEGATIVE NEGATIVE Final   C Diff toxin NEGATIVE NEGATIVE Final   C Diff interpretation No C. difficile detected.  Final    Comment: Performed at [facility], [place] [address], [place], [place] [address]      _______________________________________________________ ER Provider Called:  orthopedics   Dr. Rolena Infante They Recommend admit to  medicine    SEEN in ER _______________________________________________ Hospitalist was called for admission for SIRS  The following Work up has been ordered so far:  Orders Placed This Encounter  Procedures  . Blood culture (routine x 2)  . Body fluid culture (indicate joint specimen source)  . DG Knee Complete 4 Views Left  . DG Shoulder Left  . Basic metabolic panel  . CBC with Differential  . Magnesium  . Cell count + diff,  w/ cryst-synvl fld  . Diet NPO time specified Except for: [employer], Sips with Meds  . Consult to hospitalist  . POC SARS Coronavirus 2 Ag-ED - Nasal Swab (BD Veritor Kit)    Following Medications were ordered in ER: Medications  potassium chloride 10 mEq in 100 mL IVPB (10 mEq Intravenous New Bag/Given 05/11/21 2200)  vancomycin (VANCOCIN) IVPB 1000 mg/200 mL premix (has no administration in time range)  cefTRIAXone (ROCEPHIN) 2 g in sodium chloride 0.9 % 100 mL IVPB (0 g Intravenous Stopped 05/11/21 2054)        Consult Orders  (From admission, onward)         Start     Ordered   05/11/21 2000  Consult to hospitalist  Once       Provider:  (Not yet assigned)  Question Answer Comment  Place call to: [facility]   Reason for Consult Admit      05/11/21 2000            OTHER Significant initial  Findings:  labs showing:    Recent Labs  Lab 05/05/21 0516 05/11/21 1803 05/12/21 0105  NA 141 135 136  K 3.7 2.0* 2.2*  CO2 19* 30 27  GLUCOSE 140* 130* 118*  BUN 24* 12 12  CREATININE 1.50* 1.12* 1.14*  CALCIUM 8.4* 8.5* 8.6*  MG 1.5*  --   --     Cr  stable,    Lab Results  Component Value Date   CREATININE 1.14 (H) 05/12/2021   CREATININE 1.12 (H) 05/11/2021   CREATININE 1.50 (H) 05/05/2021    No results for input(s): AST, ALT, ALKPHOS, BILITOT, PROT, ALBUMIN in the last 168  hours. Lab Results  Component Value Date   CALCIUM 8.5 (L) 05/11/2021   PHOS 3.0 12/11/2018      Plt: Lab Results  Component Value Date   PLT 254 05/11/2021        Recent Labs  Lab 05/05/21 0516 05/11/21 1803  WBC 15.8* 23.9*  NEUTROABS  --  19.0*  HGB 11.8* 11.5*  HCT 36.1 34.7*  MCV 91.6 87.8  PLT 268 254    HG/HCT * stable,  Down *Up from baseline see below    Component Value Date/Time   HGB 11.5 (L) 05/11/2021 1803   HCT 34.7 (L) 05/11/2021 1803   MCV 87.8 05/11/2021 1803     No results for input(s): LIPASE, AMYLASE in the last 168 hours. No results for input(s): AMMONIA in the last 168 hours.   Cardiac Panel (last 3 results) Recent Labs    05/12/21 0105  CKTOTAL 40     BNP (last 3 results) No results for input(s): BNP in the last 8760 hours.    DM  labs:  HbA1C: Recent Labs    05/03/21 1843  HGBA1C 6.8*       CBG (last 3)  No results for input(s): GLUCAP in the last 72 hours.        Cultures:    Component Value Date/Time  SDES URINE, CLEAN CATCH 06/27/2011 1210   SPECREQUEST NONE 06/27/2011 1210   CULT  06/27/2011 1210    Multiple bacterial morphotypes present, none predominant. Suggest appropriate recollection if clinically indicated.   REPTSTATUS 06/28/2011 FINAL 06/27/2011 1210     Radiological Exams on Admission: DG Shoulder Left  Result Date: 05/11/2021 CLINICAL DATA:  Left shoulder pain for several weeks, recent arthrocentesis EXAM: LEFT SHOULDER - 2+ VIEW COMPARISON:  None. FINDINGS: Frontal and transscapular views of the left shoulder demonstrate no acute displaced fractures. Moderate hypertrophic changes are seen at the acromion process. Remaining joint spaces are well preserved. Left chest is clear. IMPRESSION: 1. Prominent spurring of the acromion process. No acute bony abnormality. Electronically Signed   By: Randa Ngo M.D.   On: 05/11/2021 20:28   DG Knee Complete 4 Views Left  Result Date: 05/11/2021 CLINICAL DATA:   70 year old female with left knee swelling. EXAM: LEFT KNEE - COMPLETE 4+ VIEW COMPARISON:  None. FINDINGS: There is no acute fracture or dislocation. The bones are osteopenic. There is mild arthritic changes with meniscal chondrocalcinosis. There is moderate narrowing of the patellofemoral compartment with spurring. Moderate size suprapatellar effusion noted. The soft tissues are otherwise unremarkable. IMPRESSION: 1. No acute fracture or dislocation. 2. Osteopenia with mild arthritic changes and meniscal chondrocalcinosis. 3. Moderate suprapatellar effusion. Electronically Signed   By: Anner Crete M.D.   On: 05/11/2021 18:04   _______________________________________________________________________________________________________ Latest  Blood pressure (!) 145/121, pulse 77, temperature 98.7 F (37.1 C), temperature source Oral, resp. rate 18, SpO2 99 %.   Review of Systems:    Pertinent positives include:   chills, fatigue,Fevers,  Constitutional:  No weight loss, night sweats,   weight loss  HEENT:  No headaches, Difficulty swallowing,Tooth/dental problems,Sore throat,  No sneezing, itching, ear ache, nasal congestion, post nasal drip,  Cardio-vascular:  No chest pain, Orthopnea, PND, anasarca, dizziness, palpitations.no Bilateral lower extremity swelling  GI:  No heartburn, indigestion, abdominal pain, nausea, vomiting, diarrhea, change in bowel habits, loss of appetite, melena, blood in stool, hematemesis Resp:  no shortness of breath at rest. No dyspnea on exertion, No excess mucus, no productive cough, No non-productive cough, No coughing up of blood.No change in color of mucus.No wheezing. Skin:  no rash or lesions. No jaundice GU:  no dysuria, change in color of urine, no urgency or frequency. No straining to urinate.  No flank pain.  Musculoskeletal:  No joint pain or no joint swelling. No decreased range of motion. No back pain.  Psych:  No change in mood or affect. No  depression or anxiety. No memory loss.  Neuro: no localizing neurological complaints, no tingling, no weakness, no double vision, no gait abnormality, no slurred speech, no confusion  All systems reviewed and apart from [place] all are negative _______________________________________________________________________________________________ Past Medical History:   Past Medical History:  Diagnosis Date  . Arthritis   . Cancer ([place])    melanoma on back   . Crohn's disease ([place])   . Depression   . DES exposure in utero   . Diabetes ([place])    type 2  . Forgetfulness   . Hyperlipidemia   . Hypertension   . Kidney stones   . Menopausal symptoms   . SBO (small bowel obstruction) ([place]) 06/2011, 11/2018   tx w/steroids  . Snoring       Past Surgical History:  Procedure Laterality Date  . Anal abscess surg    . anal sphincterectomy    . APPENDECTOMY  [place]   small, x 3  . BREAST BIOPSY Left   . BREAST SURGERY     Biopsy-Benign  . CERVICAL FUSION  05/2009   C4-C7  . CHOLECYSTECTOMY N/A 08/10/2016   Procedure: LAPAROSCOPIC CHOLECYSTECTOMY WITH INTRAOPERATIVE CHOLANGIOGRAM -STANDARD 4 PORT;  Surgeon: [name], MD;  Location: [facility];  Service: General;  Laterality: N/A;  . [place] AND CURETTAGE OF UTERUS  1984  . HAND SURGERY    . ILEOCECETOMY  1971   Crohns disease flare  . [place]   x2  . KNEE SURGERY Left 1998  . LAPAROSCOPIC LYSIS OF ADHESIONS N/A 08/10/2016   Procedure: LAPAROSCOPIC LYSIS OF ADHESIONS WITH CORE LIVER BIOPSY;  Surgeon: [name], MD;  Location: [facility];  Service: General;  Laterality: N/A;  . LIPOSUCTION  2004   neck  . NASAL SINUS SURGERY  1995  . PR DRAIN/INJECT LARGE JOINT/BURSA  05/11/2021      . SKIN CANCER EXCISION  03/2016   melanoma, back  . SMALL INTESTINE SURGERY  1985   Crohn stricture  . SMALL INTESTINE SURGERY  1996   Crohn stricture  . Thumb surg    . TONGUE BASE REDUCTION SOMNOPLASTY   2000   Removed uvula  . TONSILLECTOMY AND ADENOIDECTOMY  1959  . WISDOM TOOTH EXTRACTION  1974    Social History:  Ambulatory   Independently      reports that she has never smoked. She has never used smokeless tobacco. She reports that she does not drink alcohol and does not use drugs.   Family History:   Family History  Problem Relation Age of Onset  . Diabetes Mother   . Hypertension Mother   . Osteoporosis Mother   . Hypertension Father   . Heart disease Father   . Prostate cancer Father   . Diabetes Brother   . Hypertension Brother   . Cancer Other        Niece-Glioblastoma  . Breast cancer Neg Hx    ______________________________________________________________________________________________ Allergies: Allergies  Allergen Reactions  . Nsaids Other (See Comments)    H/o Crohn's disease  . Penicillins Anaphylaxis    Pt grandmother died from it and her brother had anaphylaxis so she was told never to take.  Has patient had a PCN reaction causing immediate rash, facial/tongue/throat swelling, SOB or lightheadedness with hypotension: No Has patient had a PCN reaction causing severe rash involving mucus membranes or skin necrosis: No Has patient had a PCN reaction that required hospitalization No Has patient had a PCN reaction occurring within the last 10 years: No If all of the above answers are "NO", then may pr  . Advil [Ibuprofen]     Bleeding.   Diona Fanti [Aspirin]     Hemorrhage.     Lanae Crumbly [Oxaprozin]     Bleeding.     Prior to Admission medications   Medication Sig Start Date End Date Taking? Authorizing Provider  amLODipine (NORVASC) 5 MG tablet Take 5 mg by mouth daily. 02/28/16   [provider]  budesonide (ENTOCORT EC) 3 MG 24 hr capsule Take 3 mg by mouth every morning. 03/10/21   [provider]  Cholecalciferol (VITAMIN D-3) 5000 units TABS Take 5,000 Units by mouth daily.    [provider]  cyanocobalamin (,VITAMIN  B-12,) 1000 MCG/ML injection Inject 1,000 mcg into the muscle every 21 ( twenty-one) days. 02/19/21   [provider]  fenofibrate 54  MG tablet Take 54 mg by mouth daily. 02/10/21   [provider]  losartan (COZAAR) 100 MG tablet Take 100 mg by mouth daily. 05/14/19   [provider]  metoprolol succinate (TOPROL-XL) 100 MG 24 hr tablet Take 50 mg by mouth 2 (two) times daily.    [provider]  MITIGARE 0.6 MG CAPS Take 0.6 capsules by mouth daily as needed (gout). 04/08/21   [provider]  Multiple Vitamins-Minerals (PRESERVISION AREDS) CAPS Take 1 capsule by mouth daily.    [provider]  omeprazole (PRILOSEC) 20 MG capsule Take 20 mg by mouth daily as needed (heartburn). 05/11/19   [provider]  pioglitazone (ACTOS) 15 MG tablet Take 15 mg by mouth daily. 04/05/21   [provider]  saccharomyces boulardii (FLORASTOR) 250 MG capsule Take 1 capsule (250 mg total) by mouth 2 (two) times daily. 05/05/21   Aline August, MD  triamcinolone (NASACORT) 55 MCG/ACT AERO nasal inhaler Place 1 spray into the nose daily as needed (allergy).    [provider]  venlafaxine (EFFEXOR) 75 MG tablet Take 150-225 mg by mouth See admin instructions. Takes 225 mg in the morning and 150 mg at bedtime 05/16/16   [provider]  Vitamin E 400 units TABS Take 800 Units by mouth daily.    [provider]    ___________________________________________________________________________________________________ Physical Exam: Vitals with BMI 05/11/2021 05/11/2021 05/11/2021  Height - - -  Weight - - -  BMI - - -  Systolic 283 151 761  Diastolic 607 77 77  Pulse 77 72 79     1. General:  in No Acute distress    Chronically ill -appearing 2. Psychological: Alert and  Oriented 3. Head/ENT:    Dry Mucous Membranes                          Head Non traumatic, neck supple                           Poor Dentition 4. SKIN:   decreased Skin turgor,  Skin clean Dry and intact no rash slight redness around left knee 5. Heart: Regular rate and rhythm no  Murmur, no Rub or gallop 6. Lungs:   no wheezes or crackles   7. Abdomen: Soft,  non-tender, Non distended bowel sounds present 8. Lower extremities: no clubbing, cyanosis, no  Edema  9. Neurologically Grossly intact, moving all 4 extremities equally   10. MSK: Normal range of motion    Chart has been reviewed  ______________________________________________________________________________________________  Assessment/Plan   70 y.o. female with medical history significant of Crohn's disease s/p ileocecectomy, disease , HTN, NASH, CKD, HLD, DM2, chornic diarrhea  TIA  Admitted for SIRS  Present on Admission: . SIRS (systemic inflammatory response syndrome) (HCC)   -SIRS criteria met with elevated white blood cell count,       Component Value Date/Time   WBC 23.9 (H) 05/11/2021 1803   LYMPHSABS 2.0 05/11/2021 1803     fever   RR >20 Today's Vitals   05/11/21 2230 05/11/21 2300 05/11/21 2330 05/12/21 0000  BP: (!) 145/121 (!) 157/87 (!) 149/80 (!) 164/98  Pulse: 77 76 75 77  Resp: 18 (!) 21 (!) 21 19  Temp:      TempSrc:      SpO2: 99% 100% 98% 99%  PainSc:       There is no  height or weight on file to calculate BMI.  This patient meets SIRS Criteria and may be septic.     The recent clinical data is shown below. Vitals:   05/11/21 2230 05/11/21 2300 05/11/21 2330 05/12/21 0000  BP: (!) 145/121 (!) 157/87 (!) 149/80 (!) 164/98  Pulse: 77 76 75 77  Resp: 18 (!) 21 (!) 21 19  Temp:      TempSrc:      SpO2: 99% 100% 98% 99%       Source of sepsis is unknown possible joint      - Obtain serial lactic acid and procalcitonin level.  - Initiated IV antibiotics   - await results of blood and urine culture  - Rehydrate aggressively     3:03 AM   Left knee swelling orthopedics has been consulted patient is status post tap continue antibiotics  n.p.o. postmidnight in case needs washout  History of Salmonella broad-spectrum antibiotic coverage patient recently finished Cipro.  Cephalosporin at this time attempted patient does not have allergic reaction to cephalosporins  . Nonalcoholic steatohepatitis chronic stable  . Hypokalemia -we will replace check magnesium level  . Hypertension -stable allow some permissive hypertension  . Dyslipidemia -resume home medications when able to tolerate   . Crohn's disease involving terminal ileum ([place]) -chronic patient has chronic diarrhea which is likely contributing to hypokalemia  . AKI (acute kidney injury) ([place]) -rehydrate and follow fluid status  . Chronic kidney disease, stage III (moderate) (HCC) -  -chronic avoid nephrotoxic medications such as NSAIDs, Vanco Zosyn combo,  avoid hypotension, continue to follow renal function  DM 2 -diet controlled order sliding scale Other plan as per orders.  DVT prophylaxis:  SCD       Code Status:    Code Status: Prior FULL CODE  as per patient    I had personally discussed CODE STATUS with patient      Family Communication:   Family not at  Bedside    Disposition Plan:        To home once workup is complete and patient is stable   *Following barriers for discharge:                            Electrolytes corrected                                                             Pain controlled with PO medications                               Afebrile, white count improving able to transition to PO antibiotics                             Will need to be able to tolerate PO                                                        Will need consultants to evaluate patient prior to discharge  Would benefit from PT/OT eval prior to DC  Ordered                                       Consults called:   orthopedics is aware     Admission status:  ED Disposition    ED Disposition Condition [place]:  [place] [[id number]] Level of Care: Telemetry [5] Admit to tele based on following criteria: Other see comments Comments: hypokalemia May admit patient to [facility] or [facility] if equivalent level of care is  available:: No Covid Evaluation: covid negative Diagnosis: SIRS (systemic inflammatory response syndrome) [facility]) [[id number]] Admitting Physician: Toy Baker [[id number]] Attending Physician: Toy Baker [[id number]] Estimated length of stay: pa st midnight tomorrow Certification:: I certify this patient will need inpatient services for at least 2 midnights          inpatient     I Expect 2 midnight stay secondary to severity of patient's current illness need for inpatient interventions justified by the following:  hemodynamic instability despite optimal treatment (  tachypnea  )  Severe lab/radiological/exam abnormalities including:     and extensive comorbidities including:    DM2    That are currently affecting medical management.   I expect  patient to be hospitalized for 2 midnights requiring inpatient medical care.  Patient is at high risk for adverse outcome (such as loss of life or disability) if not treated.  Indication for inpatient stay as follows:    Need for operative/procedural  intervention    Need for IV antibiotics, IV fluids,   Level of care     tele  For 12H    Lab Results  Component Value Date   [place] 05/03/2021     Precautions: admitted as  asymptomatic screening protocol    PPE: Used by the provider:   N95   eye Goggles,  Gloves      Alexis Ramos 05/12/2021, 3:09 AM    [facility]     after 2 AM please page floor coverage PA If 7AM-7PM, please contact the day team taking care of the patient using [email or web address]   Patient was evaluated in the context of the global COVID-19 pandemic, which necessitated consideration that the patient might be at risk for infection with the SARS-CoV-2  virus that causes COVID-19. Institutional protocols and algorithms that pertain to the evaluation of patients at risk for COVID-19 are in a state of rapid change based on information released by regulatory bodies including the CDC and federal and state organizations. These policies and algorithms were followed during the patient's care.

## 2021-05-11 NOTE — ED Notes (Signed)
Per PCP-[facility] for [facility]-states WBC 29-sending for septic joint

## 2021-05-11 NOTE — Consult Note (Signed)
Chief Complaint: Left knee swelling and pain  History: 70 year old female with history of Crohn's disease s/p ileocecectomy, type 2 diabetes, hypertension, hyperlipidemia, SBO, NASH, cholecystitis s/p lap chole, s/p appendectomy who presented on 05/03/2021 with worsening diarrhea and crampy abdominal for a week after eating shrimp and tuna a week ago prior to presentation. She had seen her PCP as an outpatient and a CT of the abdomen had shown colitis and was started on Cipro and Flagyl but she had worsening of symptoms.  Patient was discharged from the hospital on 05/05/2021 and presented to my office at [facility] this morning with complaints of left knee pain and difficulty walking.  The patient reports mowing the lawn yesterday and doing some yard work and then today she had significant pain.    Review of systems: Patient denies any loss of consciousness, dizziness, headaches.  No recent nausea or emesis.  Patient complains of fevers at home but is currently afebrile.  Past Medical History:  Diagnosis Date  . Arthritis   . Cancer ([place])    melanoma on back   . Crohn's disease ([place])   . Depression   . DES exposure in utero   . Diabetes ([place])    type 2  . Forgetfulness   . Hyperlipidemia   . Hypertension   . Kidney stones   . Menopausal symptoms   . SBO (small bowel obstruction) ([place]) 06/2011, 11/2018   tx w/steroids  . Snoring     Allergies  Allergen Reactions  . Nsaids Other (See Comments)    H/o Crohn's disease  . Penicillins Anaphylaxis    Pt grandmother died from it and her brother had anaphylaxis so she was told never to take.  Has patient had a PCN reaction causing immediate rash, facial/tongue/throat swelling, SOB or lightheadedness with hypotension: No Has patient had a PCN reaction causing severe rash involving mucus membranes or skin necrosis: No Has patient had a PCN reaction that required hospitalization No Has patient had a PCN reaction occurring  within the last 10 years: No If all of the above answers are "NO", then may pr  . Advil [Ibuprofen]     Bleeding.   Diona Fanti [Aspirin]     Hemorrhage.     Lanae Crumbly [Oxaprozin]     Bleeding.    No current facility-administered medications on file prior to encounter.   Current Outpatient Medications on File Prior to Encounter  Medication Sig Dispense Refill  . amLODipine (NORVASC) 5 MG tablet Take 5 mg by mouth daily.    . budesonide (ENTOCORT EC) 3 MG 24 hr capsule Take 3 mg by mouth every morning.    . Cholecalciferol (VITAMIN D-3) 5000 units TABS Take 5,000 Units by mouth daily.    . cyanocobalamin (,VITAMIN B-12,) 1000 MCG/ML injection Inject 1,000 mcg into the muscle every 21 ( twenty-one) days.    . fenofibrate 54 MG tablet Take 54 mg by mouth daily.    Marland Kitchen losartan (COZAAR) 100 MG tablet Take 100 mg by mouth daily.    . metoprolol succinate (TOPROL-XL) 100 MG 24 hr tablet Take 50 mg by mouth 2 (two) times daily.    Marland Kitchen MITIGARE 0.6 MG CAPS Take 0.6 capsules by mouth daily as needed (gout).    . Multiple Vitamins-Minerals (PRESERVISION AREDS) CAPS Take 1 capsule by mouth daily.    Marland Kitchen omeprazole (PRILOSEC) 20 MG capsule Take 20 mg by mouth daily as needed (heartburn).    . pioglitazone (ACTOS) 15  MG tablet Take 15 mg by mouth daily.    Marland Kitchen saccharomyces boulardii (FLORASTOR) 250 MG capsule Take 1 capsule (250 mg total) by mouth 2 (two) times daily. 30 capsule 0  . triamcinolone (NASACORT) 55 MCG/ACT AERO nasal inhaler Place 1 spray into the nose daily as needed (allergy).    . venlafaxine (EFFEXOR) 75 MG tablet Take 150-225 mg by mouth See admin instructions. Takes 225 mg in the morning and 150 mg at bedtime    . Vitamin E 400 units TABS Take 800 Units by mouth daily.      Physical Exam: Vitals:   05/11/21 1900 05/11/21 1930  BP: (!) 147/86 (!) 136/97  Pulse: 70 68  Resp: 15 (!) 22  Temp:    SpO2: 94% 100%   There is no height or weight on file to calculate BMI.  Alert and oriented  x3. No shortness of breath or chest pain at present. Lungs are clear to auscultation bilaterally. Cardiac: Regular rate and rhythm no rubs gallops murmurs Upper extremity exam: Full range of motion of the shoulder, elbow, wrist bilaterally.  No significant swelling or pain with direct palpation over the joints. Lower extremity: Full range of motion at the hip, knee, ankle on the right side.  No swelling or pain for deformity noted.  Left lower extremity: No hip or ankle pain with isolated joint range of motion.  Tenderness and swelling is noted around the knee.  She is able to gently flex and extend the knee but range of motion is limited due to the swelling.  No obvious ecchymosis is noted.  No laceration or evidence of trauma to the knee. Neurological exam: She is alert and oriented x3.  5/5 motor strength in the upper extremity, and right lower extremity.  Difficult to test the motor strength of the left lower extremity due to the knee pain.  5/5 EHL/tibialis anterior/gastrocnemius strength.  Negative nerve root tension signs in the lower extremity.  Sensation to light touch is intact throughout the lower extremity.  Negative Babinski test. 2+ dorsalis pedis/posterior tibialis pulses bilaterally.  Compartments are soft and nontender.   Image: CT ABDOMEN PELVIS WO CONTRAST  Result Date: 05/03/2021 CLINICAL DATA:  70 year old female with leukocytosis. Concern for colitis. EXAM: CT ABDOMEN AND PELVIS WITHOUT CONTRAST TECHNIQUE: Multidetector CT imaging of the abdomen and pelvis was performed following the standard protocol without IV contrast. COMPARISON:  CT abdomen pelvis dated 04/30/2021. FINDINGS: Evaluation of this exam is limited in the absence of intravenous contrast. Lower chest: The visualized lung bases are clear. There is coronary vascular calcification. No intra-abdominal free air or free fluid. Hepatobiliary: Diffuse fatty liver. No intrahepatic biliary dilatation. Cholecystectomy. No  retained calcified stone noted in the central CBD. Pancreas: Unremarkable. No pancreatic ductal dilatation or surrounding inflammatory changes. Spleen: Normal in size without focal abnormality. Adrenals/Urinary Tract: The adrenal glands are unremarkable. There is no hydronephrosis on either side. Vascular calcification versus punctate nonobstructing left renal upper pole calculus. Small focus of parenchymal calcification noted in the interpolar right kidney as well as in the inferior pole of the right kidney. Indeterminate small hypodense lesions from the left kidney measure up to 15 mm, suboptimally characterized on this noncontrast CT but likely cysts. Ultrasound may provide better characterization on a nonemergent/outpatient basis if clinically indicated. The visualized ureters appear unremarkable. The urinary bladder is partially distended and grossly unremarkable. Stomach/Bowel: There is loose stool throughout the colon compatible with diarrheal state. There is mild thickening of the colonic wall  without significant pericolonic stranding which may represent mild residual colitis. There is no bowel obstruction. The appendix is not visualized with certainty. No inflammatory changes identified in the right lower quadrant. Vascular/Lymphatic: Mild aortoiliac atherosclerotic disease. The IVC is unremarkable. No portal venous gas. There is no adenopathy. Reproductive: The uterus is anteverted. There is a 3.6 cm right adnexal cyst as seen previously. Recommend follow-up US in 6-12 months. Note: This recommendation does not apply to premenarchal patients and to those with increased risk (genetic, family history, elevated tumor markers or other high-risk factors) of ovarian cancer. Reference: JACR 2020 Feb; 17(2):248-254 Other: Postsurgical changes of anterior abdominal wall. Musculoskeletal: Degenerative changes of the spine. No acute osseous pathology. IMPRESSION: 1. Diarrheal state with probable mild colitis.  Correlation with clinical exam and stool cultures recommended. No bowel obstruction. 2. Fatty liver. 3. Aortic Atherosclerosis (ICD10-I70.0). Electronically Signed   By: Anner Crete M.D.   On: 05/03/2021 15:23   CT ABDOMEN PELVIS WO CONTRAST  Result Date: 04/30/2021 CLINICAL DATA:  Diarrhea. Patient reports weakness. Patient reports for 6 months they have been following a lesion in her pelvis. Electronic medical records states history of Crohn's disease. EXAM: CT ABDOMEN AND PELVIS WITHOUT CONTRAST TECHNIQUE: Multidetector CT imaging of the abdomen and pelvis was performed following the standard protocol without IV contrast. COMPARISON:  Most recent available CT 07/24/2020 FINDINGS: Lower chest: Lung bases are clear. No acute airspace disease or pleural effusion. Heart is normal in size. Hepatobiliary: Diffusely decreased hepatic density consistent with steatosis. No evidence of focal liver lesion on noncontrast exam Clips in the gallbladder fossa postcholecystectomy. No biliary dilatation. Pancreas: Mild parenchymal atrophy. No ductal dilatation or inflammation. Spleen: Normal in size without focal abnormality. Adrenals/Urinary Tract: No adrenal nodule. No hydronephrosis. Punctate nonobstructing stone in the upper left kidney. Similar cysts in the upper left kidney to prior exam. No significant perinephric edema. No ureteral calculi. Urinary bladder is completely empty and not well assessed. Stomach/Bowel: Administered enteric contrast reaches the colon. The stomach is decompressed without evidence of wall thickening or gastric inflammation. Persistent but improved wall thickening of the distal and terminal ileum from prior exam, with improved perienteric fat stranding and inflammation. No obstruction. No additional areas of small bowel inflammation or wall thickening. Portions of normal appendix are visualized. Occasional fluid and contrast levels throughout the colon with minimal liquid stool distally.  There is mild wall thickening of the mid and distal sigmoid, series 2, image 58 and image 65. No significant pericolonic inflammation. Vascular/Lymphatic: Normal caliber abdominal aorta. Mild atherosclerosis. Few prominent periportal nodes measuring up to 9 mm, typically reactive, not significantly changed from prior. No enlarged lymph nodes in the abdomen or pelvis by size criteria. Reproductive: Normal for age uterine atrophy. Right adnexal cyst measures 3.3 cm and measures simple fluid density, series 2, image 56. This previously measured 3.6 cm. Left ovary is quiescent. Other: No ascites or free fluid. Postsurgical change of the right abdominal wall. No intra-abdominal fluid collection or abscess. Musculoskeletal: Similar degenerative change in the lower lumbar spine. Avascular necrosis of the right femoral head, chronic. There are no acute or suspicious osseous abnormalities. IMPRESSION: 1. Mild wall thickening of the mid and distal sigmoid colon without significant pericolonic inflammation, suspicious for mild acute colitis. Mild liquid stool throughout the colon. 2. Persistent but improved wall thickening of the distal and terminal ileum from prior exam, with improved perienteric fat stranding and inflammation. 3. Hepatic steatosis. 4. Nonobstructing left nephrolithiasis. 5. Right adnexal cyst measures 3.3  cm, previously 3.6 cm. Given inability to assess for enhancement characteristics this is considered a simple appearing cyst, not adequately characterized. Prompt pelvic ultrasound characterization is recommended. Aortic Atherosclerosis (ICD10-I70.0). Electronically Signed   By: Keith Rake M.D.   On: 04/30/2021 19:38   DG Knee Complete 4 Views Left  Result Date: 05/11/2021 CLINICAL DATA:  70 year old female with left knee swelling. EXAM: LEFT KNEE - COMPLETE 4+ VIEW COMPARISON:  None. FINDINGS: There is no acute fracture or dislocation. The bones are osteopenic. There is mild arthritic changes with  meniscal chondrocalcinosis. There is moderate narrowing of the patellofemoral compartment with spurring. Moderate size suprapatellar effusion noted. The soft tissues are otherwise unremarkable. IMPRESSION: 1. No acute fracture or dislocation. 2. Osteopenia with mild arthritic changes and meniscal chondrocalcinosis. 3. Moderate suprapatellar effusion. Electronically Signed   By: Anner Crete M.D.   On: 05/11/2021 18:04    A/P: Patient was seen in the office today and instructed to go to the emergency room because of a question of a septic joint.  On clinical exam he has full range of motion of the left shoulder with no pain.  Furthermore, she had a dry tap.  It is unlikely that at present she has a septic shoulder based on her clinical exam and negative aspiration.  Patient does have an effusion in the left knee, but on aspiration it was yellow-tinged fluid consistent with synovial fluid.  There was no frank purulent appearance to it.  She does have pain with attempts at range of motion of the left knee consistent with her effusion.  However, at this point time I do not think she has an active septic process in the left knee.  Given her recent admission and discharge for Salmonella enteritis I think it is reasonable for her to be readmitted to the medical team for ongoing antibiotics.  I will contact my partner to see the patient tomorrow to reevaluate the shoulder and knee.  If her clinical exam changes, or her Gram stain and cytology demonstrate a septic process then she will more than likely require surgical I&D.  We will also obtain x-rays of her left shoulder.  Fortunately, x-rays of her left knee are unremarkable for any acute trauma.  Recommend she remain n.p.o. after midnight tonight in case she needs to go to the operating room tomorrow based on her aspirate lab results and clinical exam.

## 2021-05-11 NOTE — Progress Notes (Signed)
A consult was received from an ED physician for vancomycin per pharmacy dosing.  The patient's profile has been reviewed for ht/wt/allergies/indication/available labs.    A one time order has been placed for vancomycin 1000 mg.  Further antibiotics/pharmacy consults should be ordered by admitting physician if indicated.                       Thank you, Efraim Kaufmann, PharmD, BCPS 05/11/2021  8:05 PM

## 2021-05-11 NOTE — ED Triage Notes (Signed)
Pt has pain in left knee, [facility]. She reports they are concerned she has septic joint. She also reports pain in left upper back/shoulder and neck. She was hospitalized for salmonella poisoning 2 weeks ago.

## 2021-05-11 NOTE — ED Notes (Signed)
Per Dr. Langston Masker, one set of blood cultures is fine.

## 2021-05-11 NOTE — Procedures (Signed)
Procedure note: I informed the patient that I will be moving forward with an aspiration of the left shoulder and left knee because of the question of aseptic joint.  I informed her that this will be done under sterile conditions and that if she had frank purulent material that we would need to go to the operating room for formal I&D.  I explained the risks which include pain, need for surgical intervention, infection, and bleeding.  All of her questions were encouraged and addressed.  Under sterile conditions the left lateral aspect of the knee was prepped with skin cleaner and Betadine sterile of the area was toweled out and a 21-gauge needle was advanced into the left knee.  A suprapatellar approach was taken into the joint.  I aspirated approximately 25 cc of yellow-tinged synovial fluid.  The fluid was then sent to the lab for appropriate evaluation.  This include gram stain, crystals, and cytology.  Needle was then removed the skin was cleaned and a Band-Aid was applied.  Patient tolerated this well with no complications.   Under sterile the left posterior lateral aspect of the shoulder was prepped and draped in the same fashion as the knee was.  I palpated the posterior aspect of the acromion and advanced my needle into the subacromial space.  This was a dry tap.  No fluid was aspirated.  At this point the needle was removed the skin was cleaned and a Band-Aid was applied.  Patient tolerated this procedure with no complications.

## 2021-05-12 ENCOUNTER — Inpatient Hospital Stay (HOSPITAL_COMMUNITY): Payer: Medicare PPO

## 2021-05-12 DIAGNOSIS — M25562 Pain in left knee: Secondary | ICD-10-CM

## 2021-05-12 DIAGNOSIS — D72829 Elevated white blood cell count, unspecified: Secondary | ICD-10-CM

## 2021-05-12 LAB — GLUCOSE, CAPILLARY
Glucose-Capillary: 273 mg/dL — ABNORMAL HIGH (ref 70–99)
Glucose-Capillary: 201 mg/dL — ABNORMAL HIGH (ref 70–99)
Glucose-Capillary: 162 mg/dL — ABNORMAL HIGH (ref 70–99)

## 2021-05-12 LAB — SYNOVIAL CELL COUNT + DIFF, W/ CRYSTALS
Crystals, Fluid: NONE SEEN
Eosinophils-Synovial: 0 % (ref 0–1)
Lymphocytes-Synovial Fld: 4 % (ref 0–20)
Monocyte-Macrophage-Synovial Fluid: 46 % — ABNORMAL LOW (ref 50–90)
Neutrophil, Synovial: 50 % — ABNORMAL HIGH (ref 0–25)
WBC, Synovial: 15180 /mm3 — ABNORMAL HIGH (ref 0–200)

## 2021-05-12 LAB — CBC WITH DIFFERENTIAL/PLATELET
Abs Immature Granulocytes: 0.19 10*3/uL — ABNORMAL HIGH (ref 0.00–0.07)
Basophils Absolute: 0.1 10*3/uL (ref 0.0–0.1)
Basophils Relative: 0 %
Eosinophils Absolute: 0.1 10*3/uL (ref 0.0–0.5)
Eosinophils Relative: 1 %
HCT: 31.2 % — ABNORMAL LOW (ref 36.0–46.0)
Hemoglobin: 10.3 g/dL — ABNORMAL LOW (ref 12.0–15.0)
Immature Granulocytes: 1 %
Lymphocytes Relative: 8 %
Lymphs Abs: 1.4 10*3/uL (ref 0.7–4.0)
MCH: 29 pg (ref 26.0–34.0)
MCHC: 33 g/dL (ref 30.0–36.0)
MCV: 87.9 fL (ref 80.0–100.0)
Monocytes Absolute: 1.5 10*3/uL — ABNORMAL HIGH (ref 0.1–1.0)
Monocytes Relative: 8 %
Neutro Abs: 14.8 10*3/uL — ABNORMAL HIGH (ref 1.7–7.7)
Neutrophils Relative %: 82 %
Platelets: 226 10*3/uL (ref 150–400)
RBC: 3.55 MIL/uL — ABNORMAL LOW (ref 3.87–5.11)
RDW: 14.6 % (ref 11.5–15.5)
WBC: 18.1 10*3/uL — ABNORMAL HIGH (ref 4.0–10.5)
nRBC: 0 % (ref 0.0–0.2)

## 2021-05-12 LAB — URINALYSIS, ROUTINE W REFLEX MICROSCOPIC
Bilirubin Urine: NEGATIVE
Glucose, UA: NEGATIVE mg/dL
Hgb urine dipstick: NEGATIVE
Ketones, ur: NEGATIVE mg/dL
Leukocytes,Ua: NEGATIVE
Nitrite: NEGATIVE
Protein, ur: 30 mg/dL — AB
Specific Gravity, Urine: 1.008 (ref 1.005–1.030)
pH: 6 (ref 5.0–8.0)

## 2021-05-12 LAB — COMPREHENSIVE METABOLIC PANEL
ALT: 15 U/L (ref 0–44)
AST: 11 U/L — ABNORMAL LOW (ref 15–41)
Albumin: 2.6 g/dL — ABNORMAL LOW (ref 3.5–5.0)
Alkaline Phosphatase: 49 U/L (ref 38–126)
Anion gap: 9 (ref 5–15)
BUN: 10 mg/dL (ref 8–23)
CO2: 30 mmol/L (ref 22–32)
Calcium: 7.8 mg/dL — ABNORMAL LOW (ref 8.9–10.3)
Chloride: 98 mmol/L (ref 98–111)
Creatinine, Ser: 0.94 mg/dL (ref 0.44–1.00)
GFR, Estimated: >60 mL/min (ref >60)
Glucose, Bld: 155 mg/dL — ABNORMAL HIGH (ref 70–99)
Potassium: 2.3 mmol/L — CL (ref 3.5–5.1)
Sodium: 137 mmol/L (ref 135–145)
Total Bilirubin: 0.7 mg/dL (ref 0.3–1.2)
Total Protein: 6.4 g/dL — ABNORMAL LOW (ref 6.5–8.1)

## 2021-05-12 LAB — BASIC METABOLIC PANEL
Anion gap: 13 (ref 5–15)
Anion gap: 12 (ref 5–15)
BUN: 12 mg/dL (ref 8–23)
BUN: 10 mg/dL (ref 8–23)
CO2: 27 mmol/L (ref 22–32)
CO2: 24 mmol/L (ref 22–32)
Calcium: 8.6 mg/dL — ABNORMAL LOW (ref 8.9–10.3)
Calcium: 7.4 mg/dL — ABNORMAL LOW (ref 8.9–10.3)
Chloride: 96 mmol/L — ABNORMAL LOW (ref 98–111)
Chloride: 102 mmol/L (ref 98–111)
Creatinine, Ser: 1.14 mg/dL — ABNORMAL HIGH (ref 0.44–1.00)
Creatinine, Ser: 0.84 mg/dL (ref 0.44–1.00)
GFR, Estimated: 51 mL/min — ABNORMAL LOW (ref >60)
GFR, Estimated: >60 mL/min (ref >60)
Glucose, Bld: 118 mg/dL — ABNORMAL HIGH (ref 70–99)
Glucose, Bld: 125 mg/dL — ABNORMAL HIGH (ref 70–99)
Potassium: 2.2 mmol/L — CL (ref 3.5–5.1)
Potassium: 2.5 mmol/L — CL (ref 3.5–5.1)
Sodium: 136 mmol/L (ref 135–145)
Sodium: 138 mmol/L (ref 135–145)

## 2021-05-12 LAB — CK: Total CK: 40 U/L (ref 38–234)

## 2021-05-12 LAB — CBG MONITORING, ED
Glucose-Capillary: 133 mg/dL — ABNORMAL HIGH (ref 70–99)
Glucose-Capillary: 124 mg/dL — ABNORMAL HIGH (ref 70–99)

## 2021-05-12 LAB — PHOSPHORUS: Phosphorus: 2.2 mg/dL — ABNORMAL LOW (ref 2.5–4.6)

## 2021-05-12 LAB — TSH: TSH: 1.165 u[IU]/mL (ref 0.350–4.500)

## 2021-05-12 LAB — MAGNESIUM
Magnesium: 1.2 mg/dL — ABNORMAL LOW (ref 1.7–2.4)
Magnesium: 1.3 mg/dL — ABNORMAL LOW (ref 1.7–2.4)
Magnesium: 2.4 mg/dL (ref 1.7–2.4)

## 2021-05-12 LAB — PROCALCITONIN: Procalcitonin: 0.63 ng/mL

## 2021-05-12 LAB — POTASSIUM: Potassium: 2.6 mmol/L — CL (ref 3.5–5.1)

## 2021-05-12 LAB — LACTIC ACID, PLASMA: Lactic Acid, Venous: 1.1 mmol/L (ref 0.5–1.9)

## 2021-05-12 MED ORDER — MAGNESIUM SULFATE 4 GM/100ML IV SOLN
4.0000 g | Freq: Once | INTRAVENOUS | Status: AC
  Administered 2021-05-12: 4 g via INTRAVENOUS
  Filled 2021-05-12: qty 100

## 2021-05-12 MED ORDER — SODIUM CHLORIDE 0.9 % IV SOLN
2.0000 g | Freq: Two times a day (BID) | INTRAVENOUS | Status: DC
  Administered 2021-05-12 – 2021-05-14 (×5): 2 g via INTRAVENOUS
  Filled 2021-05-12 (×5): qty 2

## 2021-05-12 MED ORDER — METOPROLOL SUCCINATE ER 50 MG PO TB24
50.0000 mg | ORAL_TABLET | Freq: Two times a day (BID) | ORAL | Status: DC
  Administered 2021-05-12 – 2021-05-14 (×4): 50 mg via ORAL
  Filled 2021-05-12 (×5): qty 1

## 2021-05-12 MED ORDER — VENLAFAXINE HCL 75 MG PO TABS
225.0000 mg | ORAL_TABLET | Freq: Every day | ORAL | Status: DC
  Administered 2021-05-13 – 2021-05-14 (×2): 225 mg via ORAL
  Filled 2021-05-12 (×2): qty 3

## 2021-05-12 MED ORDER — VANCOMYCIN HCL 750 MG/150ML IV SOLN: 750.0000 mg | INTRAVENOUS | Status: DC

## 2021-05-12 MED ORDER — LOSARTAN POTASSIUM 50 MG PO TABS
100.0000 mg | ORAL_TABLET | Freq: Every day | ORAL | Status: DC
  Administered 2021-05-12 – 2021-05-14 (×3): 100 mg via ORAL
  Filled 2021-05-12 (×3): qty 2

## 2021-05-12 MED ORDER — ACETAMINOPHEN 325 MG PO TABS: 650.0000 mg | ORAL_TABLET | Freq: Four times a day (QID) | ORAL | Status: DC | PRN

## 2021-05-12 MED ORDER — AMLODIPINE BESYLATE 5 MG PO TABS
5.0000 mg | ORAL_TABLET | Freq: Every day | ORAL | Status: DC
  Administered 2021-05-12: 5 mg via ORAL
  Filled 2021-05-12 (×2): qty 1

## 2021-05-12 MED ORDER — VANCOMYCIN HCL IN DEXTROSE 1-5 GM/200ML-% IV SOLN
1000.0000 mg | INTRAVENOUS | Status: DC
  Administered 2021-05-13: 1000 mg via INTRAVENOUS
  Filled 2021-05-12: qty 200

## 2021-05-12 MED ORDER — HYDROCODONE-ACETAMINOPHEN 5-325 MG PO TABS: 1.0000 | ORAL_TABLET | ORAL | Status: DC | PRN

## 2021-05-12 MED ORDER — SODIUM CHLORIDE 0.9 % IV SOLN
75.0000 mL/h | INTRAVENOUS | Status: DC
  Administered 2021-05-12: 75 mL/h via INTRAVENOUS

## 2021-05-12 MED ORDER — ACETAMINOPHEN 650 MG RE SUPP: 650.0000 mg | Freq: Four times a day (QID) | RECTAL | Status: DC | PRN

## 2021-05-12 MED ORDER — POTASSIUM CHLORIDE CRYS ER 10 MEQ PO TBCR
40.0000 meq | EXTENDED_RELEASE_TABLET | ORAL | Status: AC
  Administered 2021-05-12 (×2): 40 meq via ORAL
  Filled 2021-05-12: qty 2
  Filled 2021-05-12: qty 4

## 2021-05-12 MED ORDER — INSULIN ASPART 100 UNIT/ML IJ SOLN
0.0000 [IU] | INTRAMUSCULAR | Status: DC
  Administered 2021-05-12: 3 [IU] via SUBCUTANEOUS
  Administered 2021-05-12: 5 [IU] via SUBCUTANEOUS
  Administered 2021-05-12: 2 [IU] via SUBCUTANEOUS
  Administered 2021-05-12: 1 [IU] via SUBCUTANEOUS
  Administered 2021-05-13: 5 [IU] via SUBCUTANEOUS
  Administered 2021-05-13: 2 [IU] via SUBCUTANEOUS
  Administered 2021-05-13: 1 [IU] via SUBCUTANEOUS
  Administered 2021-05-13: 2 [IU] via SUBCUTANEOUS
  Administered 2021-05-13: 3 [IU] via SUBCUTANEOUS
  Administered 2021-05-14 (×2): 1 [IU] via SUBCUTANEOUS
  Administered 2021-05-14: 3 [IU] via SUBCUTANEOUS
  Administered 2021-05-14: 2 [IU] via SUBCUTANEOUS
  Filled 2021-05-12: qty 0.09

## 2021-05-12 MED ORDER — VENLAFAXINE HCL 75 MG PO TABS
150.0000 mg | ORAL_TABLET | Freq: Every day | ORAL | Status: DC
  Administered 2021-05-12 – 2021-05-13 (×2): 150 mg via ORAL
  Filled 2021-05-12 (×2): qty 2

## 2021-05-12 NOTE — ED Notes (Signed)
Pt's POC BG is 133. Messaged Dr. Starla Link regarding pt's sliding scale insulin and NPO status. Per Dr. Starla Link, holding pt's insulin at this time. No further orders. Will continue to monitor.

## 2021-05-12 NOTE — Progress Notes (Signed)
At this time no concern for septic knee or shoulder.  Will follow knee fluid culture until final.  May have a diet today.  Will continue to follow  Juanita Laster [place]

## 2021-05-12 NOTE — ED Notes (Signed)
Report sent to floor nurse.

## 2021-05-12 NOTE — ED Notes (Signed)
Date and time results received: 05/12/21 0324 (use smartphrase ".now" to insert current time)  Test: Potassium Critical Value: 2.5  Name of Provider Notified: K.Kakrakandy  Orders Received? Or Actions Taken?: Orders Received - See Orders for details

## 2021-05-12 NOTE — Progress Notes (Signed)
Pharmacy Antibiotic Note  Alexis Ramos is a 70 y.o. female admitted on 05/11/2021 with possible septic joint. Recent admission for salmonella enteritis. Pharmacy has been consulted to dose vancomycin and cefepime for cellulitis.  Plan:  Vancomycin 1gm IV x 1 then 723m q36h (AUC 462.3, Scr 1.12) Cefepime 2gm IV q12h Follow renal function, cultures and clinical course    Temp (24hrs), Avg:98.7 F (37.1 C), Min:98.7 F (37.1 C), Max:98.7 F (37.1 C)  Recent Labs  Lab 05/05/21 0516 05/11/21 1803  WBC 15.8* 23.9*  CREATININE 1.50* 1.12*    Estimated Creatinine Clearance: 31 mL/min (A) (by C-G formula based on SCr of 1.12 mg/dL (H)).      Antimicrobials this admission: 5/31 CTX x1 5/31 vanc >> 6/1 cefepime >> Dose adjustments this admission:   Microbiology results: 5/31 BCx:  5/31 fluid from knee:  Thank you for allowing pharmacy to be a part of this patient's care.  EDolly RiasRPh 05/12/2021, 1:33 AM

## 2021-05-12 NOTE — Progress Notes (Signed)
Lab called critical potassium result 2.6  Lab drawn prior to potassium given.  Will recheck in AM

## 2021-05-12 NOTE — Progress Notes (Signed)
Pharmacy Antibiotic Note  Alexis Ramos is a 70 y.o. female admitted on 05/11/2021 with possible septic joint. Recent admission for salmonella enteritis. Pharmacy has been consulted to dose vancomycin and cefepime for cellulitis/? Septic joint. 05/12/2021 AF, PCT 0.63, LA 1.1, CK 40; SCr 0.94 5/31 ortho tapped L shoulder> no fluid 5/31 ortho tapped L knee> did not look infected  Plan:  Vancomycin 1gm IV x 1 given today ~ 04 am. Change maintenance dose to vancomycin 1066m q36h (AUC 529.8, Scr 0.94)  Est Css max/min 38.8/11.4 Cefepime 2gm IV q12h Follow renal function, cultures and clinical course    Temp (24hrs), Avg:98.8 F (37.1 C), Min:98.7 F (37.1 C), Max:98.9 F (37.2 C)  Recent Labs  Lab 05/11/21 1803 05/12/21 0105 05/12/21 0250 05/12/21 0821  WBC 23.9*  --   --  18.1*  CREATININE 1.12* 1.14* 0.84 0.94  LATICACIDVEN  --   --  1.1  --     Estimated Creatinine Clearance: 36.9 mL/min (by C-G formula based on SCr of 0.94 mg/dL).    Antimicrobials this admission: 5/31 CTX x1 5/31 vanc >> 6/1 cefepime >> Dose adjustments this admission:  6/1 750 q36> 1 gm q36 for AUC 529.8, 38.8/11.4 Microbiology results: 5/31 fluid from knee: 6/1 UCx: sent 6/1 BCx2: sent PTA: 5/23 GI panel neg 5/23 C diff neg 5/32 salmonella Not detected 5/23 Covid/flu neg  Thank you for allowing pharmacy to be a part of this patient's care.  MEudelia Bunch Pharm.D 05/12/2021 9:57 AM

## 2021-05-12 NOTE — Progress Notes (Signed)
Patient ID: Alexis Ramos, female   DOB: Dec 04, 1951, 70 y.o.   MRN: [id number]  PROGRESS NOTE    Alexis Ramos  GUR:[id number] DOB: 10-05-1951 DOA: 05/11/2021 PCP: [name], MD   Brief Narrative:  70 year old female with history of Crohn's disease s/p ileocecectomy, type 2 diabetes, hypertension, hyperlipidemia, SBO, NASH, cholecystitis s/p lap chole, s/p appendectomy with recent hospitalization from 05/03/2021-05/05/2021 for diarrhea/colitis treated medically with negative GI PCR and stool for C. difficile and subsequent discharge off antibiotics presented on 05/11/2021 with severe left knee pain along with fever and chills.  She was found to have left knee effusion with question of left shoulder effusion.  She was started on broad-spectrum antibiotics.  Orthopedics was consulted.  She had left knee aspiration by orthopedics.  Left shoulder aspiration was attempted but it was a dry tap.  Assessment & Plan:   SIRS Leukocytosis Left knee pain and swelling with concern for septic joint -Leukocytosis is improving.  Status post left knee aspiration by orthopedics overnight.  Follow orthopedics recommendations.  Continue n.p.o. for now in case patient needs I&D.  Continue broad-spectrum antibiotics.  Follow cultures. -Currently hemodynamically stable.  Knee pain is getting better.  Left shoulder aspiration was attempted but it was a dry tap  Recent diarrhea/colitis -recent hospitalization from 05/03/2021-05/05/2021 for diarrhea/colitis treated medically with negative GI PCR and stool for C. difficile and subsequent discharge off antibiotics as per GI recommendations  Acute kidney injury -Treated with IV fluids.  Resolved.  Monitor  Hypokalemia -Replace.  Repeat a.m. labs  Hypomagnesemia -Replace.  Repeat a.m. labs  Crohn's disease with history of ileocecectomy -Outpatient follow-up with GI.  Hypertension -Blood pressure on the higher side.  Resume amlodipine and  metoprolol  Depression -Resume home venlafaxine upon discharge.  Diabetes mellitus type 2 -CBGs with SSI.  Nonalcoholic steatohepatitis -Stable.  Outpatient follow-up with GI   DVT prophylaxis: SCDs Code Status: Full Family Communication: None at bedside Disposition Plan: Status is: Inpatient  Remains inpatient appropriate because:Inpatient level of care appropriate due to severity of illness   Dispo: The patient is from: Home              Anticipated d/c is to: Home in 1 to 2 days once clinically improves and cleared by orthopedics              Patient currently is not medically stable to d/c.   Difficult to place patient No  Consultants: Orthopedics  Procedures: Left knee aspiration on 05/11/2021 by orthopedics  Antimicrobials: Cefepime and vancomycin from 05/11/2021 onwards   Subjective: Patient seen and examined at bedside.  Feels slightly better and thinks that her left knee pain and shoulder pain are improving.  She is slightly able to move her left knee and head as well.  No overnight fever or vomiting reported.  Objective: Vitals:   05/12/21 0430 05/12/21 0500 05/12/21 0758 05/12/21 0803  BP: (!) 139/115 (!) 135/94 (!) 155/77   Pulse: 95 67 74   Resp: 19 (!) 28 13   Temp:    98.9 F (37.2 C)  TempSrc:    Oral  SpO2: 96% 98% 96%     Intake/Output Summary (Last 24 hours) at 05/12/2021 1100 Last data filed at 05/12/2021 0650 Gross per 24 hour  Intake 700 ml  Output --  Net 700 ml   There were no vitals filed for this visit.  Examination:  General exam: Appears calm and comfortable.  Currently on room air. Respiratory system: Bilateral decreased  breath sounds at bases Cardiovascular system: S1 & S2 heard, Rate controlled Gastrointestinal system: Abdomen is nondistended, soft and nontender. Normal bowel sounds heard. Extremities: Left leg mildly swollen and tender without any discharge.  Decrease range of motion. Central nervous system: Alert and  oriented. No focal neurological deficits. Moving extremities Skin: No rashes, lesions or ulcers Psychiatry: Judgement and insight appear normal. Mood & affect appropriate.     Data Reviewed: I have personally reviewed following labs and imaging studies  CBC: Recent Labs  Lab 05/11/21 1803 05/12/21 0821  WBC 23.9* 18.1*  NEUTROABS 19.0* 14.8*  HGB 11.5* 10.3*  HCT 34.7* 31.2*  MCV 87.8 87.9  PLT 254 628   Basic Metabolic Panel: Recent Labs  Lab 05/11/21 1803 05/12/21 0105 05/12/21 0250 05/12/21 0821  NA 135 136 138 137  K 2.0* 2.2* 2.5* 2.3*  CL 95* 96* 102 98  CO2 30 27 24 30   GLUCOSE 130* 118* 125* 155*  BUN 12 12 10 10   CREATININE 1.12* 1.14* 0.84 0.94  CALCIUM 8.5* 8.6* 7.4* 7.8*  MG  --   --  1.2* 1.3*   GFR: Estimated Creatinine Clearance: 36.9 mL/min (by C-G formula based on SCr of 0.94 mg/dL). Liver Function Tests: Recent Labs  Lab 05/12/21 0821  AST 11*  ALT 15  ALKPHOS 49  BILITOT 0.7  PROT 6.4*  ALBUMIN 2.6*   No results for input(s): LIPASE, AMYLASE in the last 168 hours. No results for input(s): AMMONIA in the last 168 hours. Coagulation Profile: No results for input(s): INR, PROTIME in the last 168 hours. Cardiac Enzymes: Recent Labs  Lab 05/12/21 0105  CKTOTAL 40   BNP (last 3 results) No results for input(s): PROBNP in the last 8760 hours. HbA1C: No results for input(s): HGBA1C in the last 72 hours. CBG: Recent Labs  Lab 05/05/21 1130 05/12/21 0805  GLUCAP 139* 133*   Lipid Profile: No results for input(s): CHOL, HDL, LDLCALC, TRIG, CHOLHDL, LDLDIRECT in the last 72 hours. Thyroid Function Tests: No results for input(s): TSH, T4TOTAL, FREET4, T3FREE, THYROIDAB in the last 72 hours. Anemia Panel: No results for input(s): VITAMINB12, FOLATE, FERRITIN, TIBC, IRON, RETICCTPCT in the last 72 hours. Sepsis Labs: Recent Labs  Lab 05/12/21 0105 05/12/21 0250  PROCALCITON 0.63  --   LATICACIDVEN  --  1.1    Recent Results  (from the past 240 hour(s))  Resp Panel by RT-PCR (Flu A&B, Covid) Nasopharyngeal Swab     Status: None   Collection Time: 05/03/21  2:40 PM   Specimen: Nasopharyngeal Swab; Nasopharyngeal(NP) swabs in vial transport medium  Result Value Ref Range Status   SARS Coronavirus 2 by RT PCR NEGATIVE NEGATIVE Final    Comment: (NOTE) SARS-CoV-2 target nucleic acids are NOT DETECTED.  The SARS-CoV-2 RNA is generally detectable in upper respiratory specimens during the acute phase of infection. The lowest concentration of SARS-CoV-2 viral copies this assay can detect is 138 copies/mL. A negative result does not preclude SARS-Cov-2 infection and should not be used as the sole basis for treatment or other patient management decisions. A negative result may occur with  improper specimen collection/handling, submission of specimen other than nasopharyngeal swab, presence of viral mutation(s) within the areas targeted by this assay, and inadequate number of viral copies(<138 copies/mL). A negative result must be combined with clinical observations, patient history, and epidemiological information. The expected result is Negative.  Fact Sheet for Patients:  EntrepreneurPulse.com.au  Fact Sheet for Healthcare Providers:  IncredibleEmployment.be  This test  is no t yet approved or cleared by the [place] and  has been authorized for detection and/or diagnosis of SARS-CoV-2 by FDA under an Emergency Use Authorization (EUA). This EUA will remain  in effect (meaning this test can be used) for the duration of the COVID-19 declaration under Section 564(b)(1) of the Act, 21 U.S.C.section 360bbb-3(b)(1), unless the authorization is terminated  or revoked sooner.       Influenza A by PCR NEGATIVE NEGATIVE Final   Influenza B by PCR NEGATIVE NEGATIVE Final    Comment: (NOTE) The Xpert Xpress SARS-CoV-2/FLU/RSV plus assay is intended as an aid in the  diagnosis of influenza from Nasopharyngeal swab specimens and should not be used as a sole basis for treatment. Nasal washings and aspirates are unacceptable for Xpert Xpress SARS-CoV-2/FLU/RSV testing.  Fact Sheet for Patients: EntrepreneurPulse.com.au  Fact Sheet for Healthcare Providers: IncredibleEmployment.be  This test is not yet approved or cleared by the [place] FDA and has been authorized for detection and/or diagnosis of SARS-CoV-2 by FDA under an Emergency Use Authorization (EUA). This EUA will remain in effect (meaning this test can be used) for the duration of the COVID-19 declaration under Section 564(b)(1) of the Act, 21 U.S.C. section 360bbb-3(b)(1), unless the authorization is terminated or revoked.  Performed at [facility], [place] [address], [place], [place] [address]   Gastrointestinal Panel by PCR , Stool     Status: None   Collection Time: 05/03/21 10:30 PM   Specimen: Rectum; Stool  Result Value Ref Range Status   Campylobacter species NOT DETECTED NOT DETECTED Final   Plesimonas shigelloides NOT DETECTED NOT DETECTED Final   Salmonella species NOT DETECTED NOT DETECTED Final   Yersinia enterocolitica NOT DETECTED NOT DETECTED Final   Vibrio species NOT DETECTED NOT DETECTED Final   Vibrio cholerae NOT DETECTED NOT DETECTED Final   Enteroaggregative E coli (EAEC) NOT DETECTED NOT DETECTED Final   Enteropathogenic E coli (EPEC) NOT DETECTED NOT DETECTED Final   Enterotoxigenic E coli (ETEC) NOT DETECTED NOT DETECTED Final   Shiga like toxin producing E coli (STEC) NOT DETECTED NOT DETECTED Final   Shigella/Enteroinvasive E coli (EIEC) NOT DETECTED NOT DETECTED Final   Cryptosporidium NOT DETECTED NOT DETECTED Final   Cyclospora cayetanensis NOT DETECTED NOT DETECTED Final   Entamoeba histolytica NOT DETECTED NOT DETECTED Final   Giardia lamblia NOT DETECTED NOT DETECTED Final   Adenovirus F40/41  NOT DETECTED NOT DETECTED Final   Astrovirus NOT DETECTED NOT DETECTED Final   Norovirus GI/GII NOT DETECTED NOT DETECTED Final   Rotavirus A NOT DETECTED NOT DETECTED Final   Sapovirus (I, II, IV, and V) NOT DETECTED NOT DETECTED Final    Comment: Performed at [facility], [address] [place], [place] [address]  C Difficile Quick Screen w PCR reflex     Status: None   Collection Time: 05/03/21 10:30 PM   Specimen: Rectum; Stool  Result Value Ref Range Status   C Diff antigen NEGATIVE NEGATIVE Final   C Diff toxin NEGATIVE NEGATIVE Final   C Diff interpretation No C. difficile detected.  Final    Comment: Performed at [facility], [place] [address] [place], [place] [address]         Radiology Studies: DG CHEST PORT 1 VIEW  Result Date: 05/12/2021 CLINICAL DATA:  Body aches EXAM: PORTABLE CHEST 1 VIEW COMPARISON:  01/08/2019 FINDINGS: The heart size and mediastinal contours are within normal limits. Both lungs are  clear. Surgical hardware in the cervical spine. IMPRESSION: No active disease. Electronically Signed   By: Donavan Foil M.D.   On: 05/12/2021 01:16   DG Shoulder Left  Result Date: 05/11/2021 CLINICAL DATA:  Left shoulder pain for several weeks, recent arthrocentesis EXAM: LEFT SHOULDER - 2+ VIEW COMPARISON:  None. FINDINGS: Frontal and transscapular views of the left shoulder demonstrate no acute displaced fractures. Moderate hypertrophic changes are seen at the acromion process. Remaining joint spaces are well preserved. Left chest is clear. IMPRESSION: 1. Prominent spurring of the acromion process. No acute bony abnormality. Electronically Signed   By: Randa Ngo M.D.   On: 05/11/2021 20:28   DG Knee Complete 4 Views Left  Result Date: 05/11/2021 CLINICAL DATA:  70 year old female with left knee swelling. EXAM: LEFT KNEE - COMPLETE 4+ VIEW COMPARISON:  None. FINDINGS: There is no acute fracture or dislocation. The bones are osteopenic.  There is mild arthritic changes with meniscal chondrocalcinosis. There is moderate narrowing of the patellofemoral compartment with spurring. Moderate size suprapatellar effusion noted. The soft tissues are otherwise unremarkable. IMPRESSION: 1. No acute fracture or dislocation. 2. Osteopenia with mild arthritic changes and meniscal chondrocalcinosis. 3. Moderate suprapatellar effusion. Electronically Signed   By: Anner Crete M.D.   On: 05/11/2021 18:04        Scheduled Meds: . insulin aspart  0-9 Units Subcutaneous Q4H  . potassium chloride  40 mEq Oral Q4H   Continuous Infusions: . ceFEPime (MAXIPIME) IV Stopped (05/12/21 0650)  . magnesium sulfate bolus IVPB 4 g (05/12/21 1016)  . [START ON 05/13/2021] vancomycin            Alexis August, MD [facility] 05/12/2021, 11:00 AM

## 2021-05-13 LAB — COMPREHENSIVE METABOLIC PANEL
ALT: 13 U/L (ref 0–44)
AST: 10 U/L — ABNORMAL LOW (ref 15–41)
Albumin: 2.5 g/dL — ABNORMAL LOW (ref 3.5–5.0)
Alkaline Phosphatase: 54 U/L (ref 38–126)
Anion gap: 7 (ref 5–15)
BUN: 13 mg/dL (ref 8–23)
CO2: 30 mmol/L (ref 22–32)
Calcium: 7.9 mg/dL — ABNORMAL LOW (ref 8.9–10.3)
Chloride: 102 mmol/L (ref 98–111)
Creatinine, Ser: 0.91 mg/dL (ref 0.44–1.00)
GFR, Estimated: >60 mL/min (ref >60)
Glucose, Bld: 140 mg/dL — ABNORMAL HIGH (ref 70–99)
Potassium: 2.9 mmol/L — ABNORMAL LOW (ref 3.5–5.1)
Sodium: 139 mmol/L (ref 135–145)
Total Bilirubin: 0.4 mg/dL (ref 0.3–1.2)
Total Protein: 6.1 g/dL — ABNORMAL LOW (ref 6.5–8.1)

## 2021-05-13 LAB — CBC WITH DIFFERENTIAL/PLATELET
Abs Immature Granulocytes: 0.13 10*3/uL — ABNORMAL HIGH (ref 0.00–0.07)
Basophils Absolute: 0.1 10*3/uL (ref 0.0–0.1)
Basophils Relative: 0 %
Eosinophils Absolute: 0.2 10*3/uL (ref 0.0–0.5)
Eosinophils Relative: 2 %
HCT: 30.4 % — ABNORMAL LOW (ref 36.0–46.0)
Hemoglobin: 9.7 g/dL — ABNORMAL LOW (ref 12.0–15.0)
Immature Granulocytes: 1 %
Lymphocytes Relative: 11 %
Lymphs Abs: 1.5 10*3/uL (ref 0.7–4.0)
MCH: 29 pg (ref 26.0–34.0)
MCHC: 31.9 g/dL (ref 30.0–36.0)
MCV: 90.7 fL (ref 80.0–100.0)
Monocytes Absolute: 1.4 10*3/uL — ABNORMAL HIGH (ref 0.1–1.0)
Monocytes Relative: 10 %
Neutro Abs: 11.1 10*3/uL — ABNORMAL HIGH (ref 1.7–7.7)
Neutrophils Relative %: 76 %
Platelets: 227 10*3/uL (ref 150–400)
RBC: 3.35 MIL/uL — ABNORMAL LOW (ref 3.87–5.11)
RDW: 14.6 % (ref 11.5–15.5)
WBC: 14.4 10*3/uL — ABNORMAL HIGH (ref 4.0–10.5)
nRBC: 0 % (ref 0.0–0.2)

## 2021-05-13 LAB — HEMOGLOBIN A1C
Hgb A1c MFr Bld: 7.1 % — ABNORMAL HIGH (ref 4.8–5.6)
Mean Plasma Glucose: 157 mg/dL

## 2021-05-13 LAB — GLUCOSE, CAPILLARY
Glucose-Capillary: 168 mg/dL — ABNORMAL HIGH (ref 70–99)
Glucose-Capillary: 149 mg/dL — ABNORMAL HIGH (ref 70–99)
Glucose-Capillary: 199 mg/dL — ABNORMAL HIGH (ref 70–99)

## 2021-05-13 LAB — URINE CULTURE: Culture: <10000 — AB

## 2021-05-13 LAB — MAGNESIUM: Magnesium: 2 mg/dL (ref 1.7–2.4)

## 2021-05-13 MED ORDER — POTASSIUM CHLORIDE CRYS ER 10 MEQ PO TBCR
40.0000 meq | EXTENDED_RELEASE_TABLET | ORAL | Status: AC
  Administered 2021-05-13 (×2): 40 meq via ORAL
  Filled 2021-05-13 (×2): qty 4

## 2021-05-13 MED ORDER — SACCHAROMYCES BOULARDII 250 MG PO CAPS
250.0000 mg | ORAL_CAPSULE | Freq: Two times a day (BID) | ORAL | Status: DC
  Administered 2021-05-13 – 2021-05-14 (×3): 250 mg via ORAL
  Filled 2021-05-13 (×3): qty 1

## 2021-05-13 MED ORDER — MAGIC MOUTHWASH
10.0000 mL | Freq: Four times a day (QID) | ORAL | Status: DC | PRN
  Filled 2021-05-13: qty 10

## 2021-05-13 MED ORDER — HYDRALAZINE HCL 20 MG/ML IJ SOLN
5.0000 mg | Freq: Four times a day (QID) | INTRAMUSCULAR | Status: DC | PRN
  Administered 2021-05-13: 5 mg via INTRAVENOUS
  Filled 2021-05-13: qty 1

## 2021-05-13 NOTE — Progress Notes (Signed)
Subjective:   [facility] following in regards to concern for septic knee and/or shoulder. Shoulder tap was dry. We were following knee cultures.  Patient reports pain as mild.  Improving. Patient now able to move left knee on her own which she states she could not do previously.   Objective: Vital signs in last 24 hours: Temp:  [97.8 F (36.6 C)-98.9 F (37.2 C)] 97.8 F (36.6 C) (06/02 1326) Pulse Rate:  [68-84] 74 (06/02 1326) Resp:  [16-22] 16 (06/02 1326) BP: (137-153)/(70-92) 145/92 (06/02 1326) SpO2:  [96 %-98 %] 98 % (06/02 1326)  Intake/Output from previous day: 06/01 0701 - 06/02 0700 In: 655.2 [P.O.:480; IV Piggyback:175.2] Out: -  Intake/Output this shift: Total I/O In: 340 [P.O.:240; IV Piggyback:100] Out: -   Recent Labs    05/11/21 1803 05/12/21 0821 05/13/21 0518  HGB 11.5* 10.3* 9.7*   Recent Labs    05/12/21 0821 05/13/21 0518  WBC 18.1* 14.4*  RBC 3.55* 3.35*  HCT 31.2* 30.4*  PLT 226 227   Recent Labs    05/12/21 0821 05/12/21 1638 05/13/21 0518  NA 137  --  139  K 2.3* 2.6* 2.9*  CL 98  --  102  CO2 30  --  30  BUN 10  --  13  CREATININE 0.94  --  0.91  GLUCOSE 155*  --  140*  CALCIUM 7.8*  --  7.9*   No results for input(s): LABPT, INR in the last 72 hours.  Alert and oriented x3. Upper extremity exam: Full range of motion of the shoulder, elbow, wrist bilaterally.  No significant swelling or pain with direct palpation over the joints. Lower extremity: Full range of motion at the hip, knee, ankle on the right side.  No swelling or pain for deformity noted.  Left lower extremity: No hip or ankle pain with isolated joint range of motion.  Tenderness and swelling is noted around the left knee.  She is able to gently flex and extend the knee but range of motion is somewhat limited due to pain, but improved compared to initial eval.  No obvious ecchymosis is noted.  No laceration or evidence of trauma to the knee. Neurological exam: She is  alert and oriented x3.  5/5 motor strength in the upper extremity, and right lower extremity.  Difficult to test the motor strength of the left lower extremity due to the knee pain.  5/5 EHL/tibialis anterior/gastrocnemius strength.  .  Sensation to light touch is intact throughout the lower extremity.   2+ dorsalis pedis/posterior tibialis pulses bilaterally.  Compartments are soft and nontender.  Assessment/Plan:    Discussed with Dr. Rolena Infante. No concern for septic knee or shoulder. Orthopedics will sign off.  Recommend patient follow-up with PCP for knee and shoulder pain.    Charlyne Petrin 05/13/2021, 6:04 PM

## 2021-05-13 NOTE — Progress Notes (Signed)
Occupational Therapy Evaluation  Patient lives home alone in a single level house with ramp access. Patient is typically very independent at baseline, mows yard, tends to a garden. Patient reports she bought 4WW after recent hospitalization with salmonella and has been using to get around the house. Currently patient is overall supervision level with functional ambulation and 4WW with mild limping in L LE. Patient needed assist to don L sock due to weakness in L LE "it doesn't hurt its just weak." Patient able to perform grooming/hygiene at sink, clothing management and peri care and toilet transfer at supervision level. Patient upper extremities AROM grossly intact, patient reporting stiffness and pointing to trapezius area. Instruct patient in stretching and AAROM for shoulders, patient verbalize understanding. Recommend continued acute OT services in order to facilitate D/C home, do not anticipate follow up needs post D/C.     05/13/21 1100  OT Visit Information  Last OT Received On 05/13/21  Assistance Needed +1  PT/OT/SLP Co-Evaluation/Treatment Yes  Reason for Co-Treatment To address functional/ADL transfers  OT goals addressed during session ADL's and self-care  History of Present Illness Pt is a 70 year old female with recent hospitalization from 05/03/2021-05/05/2021 for diarrhea/colitis; returned to ED on 05/11/2021 with severe L knee pain along with fever and chills.  found to have L knee effusion with question of L shoulder effusion. She had left knee aspiration by orthopedics; L shoulder aspiration was attempted but it was a dry tap. PMH: SBO, HTN, diabetes, Crohn's disease s/p ileocecectomy, melanoma on back, appendectomy  Precautions  Precautions Fall  Restrictions  Weight Bearing Restrictions No  Home Living  Family/patient expects to be discharged to: Private residence  Living Arrangements Alone  Available Help at Discharge Available PRN/intermittently;Friend(s)  Type of [place] to enter (ramp at back door with access to garage if needed)  Entrance Stairs-Number of Steps 5 + 1  Entrance Stairs-[place] One level  Bathroom Shower/Tub Tub/shower unit;Walk-in Cytogeneticist Yes  How Accessible Accessible via walker  [place] - 4 wheels;BSC;Shower seat;Wheelchair - manual  Additional Comments patient states home is wheelchair accessible, patient reports some equipment is in storage  Prior Function  Level of Independence Independent  Comments was mowing her lawn and fully independent prior to getting salmonella ~ week ago, friends have lately beeng checking on her and completing household chores  Communication  Communication No difficulties  Pain Assessment  Pain Assessment No/denies pain (reporting weakness in knee not pain)  Cognition  Arousal/Alertness Awake/alert  Behavior During Therapy WFL for tasks assessed/performed  Overall Cognitive Status Within Functional Limits for tasks assessed  Upper Extremity Assessment  Upper Extremity Assessment RUE deficits/detail;LUE deficits/detail  RUE Deficits / Details grossly AROM intact, reports R hand weakness from arthritis, carpel tunnel at baseline  LUE Deficits / Details grossly AROM is intact, reports more stiff/tightness and pointing to trap region. instructed patient in stretches/AAROM for shoulders to help alleviate this. reports hand weakness due to arthritis at baseline.  Lower Extremity Assessment  Lower Extremity Assessment Defer to PT evaluation  Cervical / Trunk Assessment  Cervical / Trunk Assessment Normal  ADL  Overall ADL's  Needs assistance/impaired  Eating/Feeding Independent;Sitting  Grooming Wash/dry hands;Supervision/safety;Standing  Upper Body Bathing Set up;Sitting  Lower Body Bathing Minimal assistance;Sitting/lateral leans;Sit to/from stand  Upper Body Dressing  Set up;Sitting  Lower Body  Dressing Minimal assistance;Sit to/from stand;Sitting/lateral leans  Lower Body Dressing  Details (indicate cue type and reason) to don L sock due to L LE weakness, typically wears slide shoes  Toilet Transfer Supervision/safety;Cueing for safety;Ambulation;Regular Radio producer Details (indicate cue type and reason) min cues for safety managing brakes on 4WW  Toileting- Clothing Manipulation and Hygiene Independent;Sit to/from stand  Functional mobility during ADLs Supervision/safety;Min guard (rollator)  General ADL Comments instructed patient in stretches/AAROM for shoulders due to reports of stiffness L > R, verbalize understanding  Bed Mobility  Overal bed mobility Modified Independent  General bed mobility comments HOB elevated, slightly increased time  Transfers  Overall transfer level Needs assistance  Equipment used 4-wheeled walker  Transfers Sit to/from Stand  Sit to Stand Supervision  General transfer comment VCs for hand placement and occasional reminders to lock rollator brakes  Balance  Overall balance assessment Needs assistance  Sitting-balance support Feet supported  Sitting balance-Leahy Scale Good  Sitting balance - Comments seated EOB  Standing balance support During functional activity  Standing balance-Leahy Scale Fair  Standing balance comment static without UE support, dynamic with RW  OT - End of Session  Equipment Utilized During Treatment Other (comment) (rollator)  Activity Tolerance Patient tolerated treatment well  Patient left in chair;with call bell/phone within reach  Nurse Communication Mobility status  OT Assessment  OT Recommendation/Assessment Patient needs continued OT Services  OT Visit Diagnosis Unsteadiness on feet (R26.81)  OT Problem List Decreased activity tolerance;Decreased strength;Impaired balance (sitting and/or standing);Decreased safety awareness  OT Plan  OT Frequency (ACUTE ONLY) Min 2X/week  OT  Treatment/Interventions (ACUTE ONLY) Therapeutic exercise;Self-care/ADL training;Patient/family education;Balance training;Therapeutic activities;DME and/or AE instruction  AM-PAC OT "6 Clicks" Daily Activity Outcome Measure (Version 2)  Help from another person eating meals? 4  Help from another person taking care of personal grooming? 3  Help from another person toileting, which includes using toliet, bedpan, or urinal? 3  Help from another person bathing (including washing, rinsing, drying)? 3  Help from another person to put on and taking off regular upper body clothing? 3  Help from another person to put on and taking off regular lower body clothing? 3  6 Click Score 19  OT Recommendation  Follow Up Recommendations No OT follow up  OT Equipment None recommended by OT  Individuals Consulted  Consulted and Agree with Results and Recommendations Patient  Acute Rehab OT Goals  Patient Stated Goal home with some therapy  OT Goal Formulation With patient  Time For Goal Achievement 05/27/21  Potential to Achieve Goals Good  OT Time Calculation  OT Start Time (ACUTE ONLY) 0915  OT Stop Time (ACUTE ONLY) 3267  OT Time Calculation (min) 37 min  OT General Charges  $OT Visit 1 Visit  OT Evaluation  $OT Eval Low Complexity 1 Low  Written Expression  Dominant Hand Right   Delbert Phenix OT OT pager: [phone]

## 2021-05-13 NOTE — Evaluation (Signed)
Physical Therapy Evaluation Patient Details Name: Alexis Ramos MRN: [id number] DOB: 09-Oct-1951 Today's Date: 05/13/2021   History of Present Illness  Pt is a 70 year old female with recent hospitalization from 05/03/2021-05/05/2021 for diarrhea/colitis; returned to ED on 05/11/2021 with severe L knee pain along with fever and chills.  found to have L knee effusion with question of L shoulder effusion. She had left knee aspiration by orthopedics; L shoulder aspiration was attempted but it was a dry tap. PMH: SBO, HTN, diabetes, Crohn's disease s/p ileocecectomy, melanoma on back, appendectomy    Clinical Impression  Pt admitted with above diagnosis. Pt independent at baseline, recently has friends completing household chores due to weakness. Pt currently min guard with ambulation, demonstrates decreased L knee/hip AROM in gait cycle, denies pain and reports "weakness" complaints, short R step with decreased LLE weight-bearing. Pt without LOB, intermittent VCs for maneuvering rollator and hand placement with transfers. Educated pt on time OOB, instructed on performing LAQ and seated marching while up in chair and pt verbalized understanding. Pt with good friend support and DME needs at home. Pt currently with functional limitations due to the deficits listed below (see PT Problem List). Pt will benefit from skilled PT to increase their independence and safety with mobility to allow discharge to the venue listed below.       Follow Up Recommendations Home health PT    Equipment Recommendations  None recommended by PT    Recommendations for Other Services       Precautions / Restrictions Precautions Precautions: Fall Restrictions Weight Bearing Restrictions: No      Mobility  Bed Mobility Overal bed mobility: Modified Independent    General bed mobility comments: HOB elevated, slightly increased time    Transfers Overall transfer level: Needs assistance Equipment used: 4-wheeled  walker Transfers: Sit to/from Stand Sit to Stand: Supervision  General transfer comment: VCs for hand placement and occasional reminders to lock rollator brakes  Ambulation/Gait Ambulation/Gait assistance: Min guard Gait Distance (Feet): 120 Feet Assistive device: 4-wheeled walker Gait Pattern/deviations: Step-to pattern;Decreased stride length;Decreased stance time - left Gait velocity: decreased   General Gait Details: step to gait pattern with decreased L stance time, decreased L hip/knee flexion in swing with slight limp but denies pain, limited due to "weakness" complaints in LLE, VCs for maneuvering rollator in tight spaces and with turns  Science writer    Modified Rankin (Stroke Patients Only)       Balance Overall balance assessment: Needs assistance Sitting-balance support: Feet supported Sitting balance-Leahy Scale: Good Sitting balance - Comments: seated EOB   Standing balance support: During functional activity Standing balance-Leahy Scale: Fair Standing balance comment: static without UE support, dynamic with RW              Pertinent Vitals/Pain Pain Assessment: No/denies pain ("my knee is weak, not painful")    Home Living Family/patient expects to be discharged to:: Private residence Living Arrangements: Alone Available Help at Discharge: Available PRN/intermittently;Friend(s) Type of Home: House Home Access: Stairs to enter (ramp at back door that has access to garage if needed) Entrance Stairs-Rails: Right;Left Entrance Stairs-Number of Steps: 5 + 1 Home Layout: One level Home Equipment: Walker - 4 wheels;Bedside commode;Shower seat;Wheelchair - manual Additional Comments: patient states home is wheelchair accessible, patient reports some equipment is in storage    Prior Function Level of Independence: Independent  Comments: was mowing her lawn and fully independent prior to getting salmonella ~ week ago,  friends have lately beeng checking on her and completing household chores     Hand Dominance   Dominant Hand: Right    Extremity/Trunk Assessment   Upper Extremity Assessment Upper Extremity Assessment: Defer to OT evaluation    Lower Extremity Assessment Lower Extremity Assessment: LLE deficits/detail;RLE deficits/detail RLE Deficits / Details: AROM WNL, strength 5/5, denies numbness/tingling RLE Sensation: WNL RLE Coordination: WNL LLE Deficits / Details: AROM WNL, knee strength 3/5, hip flexion 3/5, hip abd/add 4+/5, ankle 5/5 LLE Sensation: WNL LLE Coordination: WNL    Cervical / Trunk Assessment Cervical / Trunk Assessment: Normal  Communication   Communication: No difficulties  Cognition Arousal/Alertness: Awake/alert Behavior During Therapy: WFL for tasks assessed/performed Overall Cognitive Status: Within Functional Limits for tasks assessed       General Comments      Exercises     Assessment/Plan    PT Assessment Patient needs continued PT services  PT Problem List Decreased strength;Decreased activity tolerance;Decreased balance       PT Treatment Interventions DME instruction;Gait training;Stair training;Functional mobility training;Therapeutic activities;Therapeutic exercise;Balance training;Patient/family education    PT Goals (Current goals can be found in the Care Plan section)  Acute Rehab PT Goals Patient Stated Goal: home with some therapy PT Goal Formulation: With patient Time For Goal Achievement: 05/27/21 Potential to Achieve Goals: Good    Frequency Min 3X/week   Barriers to discharge        Co-evaluation PT/OT/SLP Co-Evaluation/Treatment: Yes Reason for Co-Treatment: For patient/therapist safety;To address functional/ADL transfers PT goals addressed during session: Mobility/safety with mobility;Balance;Proper use of DME         AM-PAC PT "6 Clicks" Mobility  Outcome Measure Help needed turning from your back to your side  while in a flat bed without using bedrails?: None Help needed moving from lying on your back to sitting on the side of a flat bed without using bedrails?: None Help needed moving to and from a bed to a chair (including a wheelchair)?: A Little Help needed standing up from a chair using your arms (e.g., wheelchair or bedside chair)?: A Little Help needed to walk in hospital room?: A Little Help needed climbing 3-5 steps with a railing? : A Little 6 Click Score: 20    End of Session   Activity Tolerance: Patient tolerated treatment well Patient left: in chair;with call bell/phone within reach;with nursing/sitter in room Nurse Communication: Mobility status PT Visit Diagnosis: Other abnormalities of gait and mobility (R26.89)    Time: 5170-0174 PT Time Calculation (min) (ACUTE ONLY): 37 min   Charges:   PT Evaluation $PT Eval Low Complexity: 1 Low PT Treatments $Therapeutic Activity: 8-22 mins         Tori Bricelyn Freestone PT, DPT 05/13/21, 10:02 AM

## 2021-05-13 NOTE — Progress Notes (Signed)
Patient ID: Alexis Ramos, female   DOB: 1951/02/04, 70 y.o.   MRN: [id number]  PROGRESS NOTE    Alexis Ramos  KKX:[id number] DOB: 1951-07-06 DOA: 05/11/2021 PCP: [name], MD   Brief Narrative:  70 year old female with history of Crohn's disease s/p ileocecectomy, type 2 diabetes, hypertension, hyperlipidemia, SBO, NASH, cholecystitis s/p lap chole, s/p appendectomy with recent hospitalization from 05/03/2021-05/05/2021 for diarrhea/colitis treated medically with negative GI PCR and stool for C. difficile and subsequent discharge off antibiotics presented on 05/11/2021 with severe left knee pain along with fever and chills.  She was found to have left knee effusion with question of left shoulder effusion.  She was started on broad-spectrum antibiotics.  Orthopedics was consulted.  She had left knee aspiration by orthopedics.  Left shoulder aspiration was attempted but it was a dry tap.  Assessment & Plan:   SIRS Leukocytosis Left knee pain and swelling with concern for septic joint -Leukocytosis is improving.  Status post left knee aspiration by orthopedics on admission.  Synovial fluid cultures negative so far.  Currently on broad-spectrum antibiotics.  Orthopedics subsequently evaluated the patient and was of the opinion that there was no concern for septic knee or shoulder at this time.  Follow orthopedics recommendations.   -Leukocytosis is improving.  Monitor. -Currently hemodynamically stable.  Knee pain is getting better.  Left shoulder aspiration was attempted but it was a dry tap -Request PT eval.  Recent diarrhea/colitis -recent hospitalization from 05/03/2021-05/05/2021 for diarrhea/colitis treated medically with negative GI PCR and stool for C. difficile and subsequent discharge off antibiotics as per GI recommendations  Acute kidney injury -Treated with IV fluids.  Resolved.  Monitor  Hypokalemia -Replace.  Repeat a.m. labs  Hypomagnesemia -Improved  Crohn's  disease with history of ileocecectomy -Outpatient follow-up with GI.  Hypertension -Blood pressure on the higher side.  Continue metoprolol, losartan and amlodipine  Depression -Continue venlafaxine.  Diabetes mellitus type 2 -CBGs with SSI.  Patient is requesting regular diet instead of carb modified diet.  Nonalcoholic steatohepatitis -Stable.  Outpatient follow-up with GI   DVT prophylaxis: SCDs Code Status: Full Family Communication: None at bedside Disposition Plan: Status is: Inpatient  Remains inpatient appropriate because:Inpatient level of care appropriate due to severity of illness   Dispo: The patient is from: Home              Anticipated d/c is to: Home in 1 to 2 days once clinically improves and cleared by orthopedics              Patient currently is not medically stable to d/c.   Difficult to place patient No  Consultants: Orthopedics  Procedures: Left knee aspiration on 05/11/2021 by orthopedics  Antimicrobials: Cefepime and vancomycin from 05/11/2021 onwards   Subjective: Patient seen and examined at bedside.  Feels that her left knee pain is getting better but still hurts intermittently.  Denies worsening diarrhea or abdominal pain.  No overnight fever or vomiting reported. Objective: Vitals:   05/12/21 1731 05/12/21 2100 05/13/21 0121 05/13/21 0420  BP: (!) 155/71 (!) 153/76 (!) 150/83 137/70  Pulse: 76 76 84 68  Resp: 18 20 (!) 22 19  Temp: 98.2 F (36.8 C) 97.8 F (36.6 C) 98.9 F (37.2 C) 98.3 F (36.8 C)  TempSrc: Oral Oral Oral Oral  SpO2: 100% 98% 98% 96%    Intake/Output Summary (Last 24 hours) at 05/13/2021 0743 Last data filed at 05/13/2021 0600 Gross per 24 hour  Intake 655.23 ml  Output --  Net 655.23 ml   There were no vitals filed for this visit.  Examination:  General exam: No acute distress.  On room air currently. Respiratory system: Decreased bilateral breath sounds at bases Cardiovascular system: Rate controlled,  S1-S2 heard Gastrointestinal system: Abdomen is distended slightly, soft and nontender.  Bowel sounds are heard  extremities: Mildly swollen left knee area with very minimal tenderness  Central nervous system: Awake and alert.  No focal neurological deficits.  Moves extremities  skin: No obvious ecchymosis/lesions Psychiatry: Normal mood, affect and judgment    Data Reviewed: I have personally reviewed following labs and imaging studies  CBC: Recent Labs  Lab 05/11/21 1803 05/12/21 0821 05/13/21 0518  WBC 23.9* 18.1* 14.4*  NEUTROABS 19.0* 14.8* 11.1*  HGB 11.5* 10.3* 9.7*  HCT 34.7* 31.2* 30.4*  MCV 87.8 87.9 90.7  PLT 254 226 553   Basic Metabolic Panel: Recent Labs  Lab 05/11/21 1803 05/12/21 0105 05/12/21 0250 05/12/21 0821 05/12/21 1638 05/13/21 0518  NA 135 136 138 137  --  139  K 2.0* 2.2* 2.5* 2.3* 2.6* 2.9*  CL 95* 96* 102 98  --  102  CO2 30 27 24 30   --  30  GLUCOSE 130* 118* 125* 155*  --  140*  BUN 12 12 10 10   --  13  CREATININE 1.12* 1.14* 0.84 0.94  --  0.91  CALCIUM 8.5* 8.6* 7.4* 7.8*  --  7.9*  MG  --   --  1.2* 1.3* 2.4 2.0  PHOS  --   --   --  2.2*  --   --    GFR: Estimated Creatinine Clearance: 38.1 mL/min (by C-G formula based on SCr of 0.91 mg/dL). Liver Function Tests: Recent Labs  Lab 05/12/21 0821 05/13/21 0518  AST 11* 10*  ALT 15 13  ALKPHOS 49 54  BILITOT 0.7 0.4  PROT 6.4* 6.1*  ALBUMIN 2.6* 2.5*   No results for input(s): LIPASE, AMYLASE in the last 168 hours. No results for input(s): AMMONIA in the last 168 hours. Coagulation Profile: No results for input(s): INR, PROTIME in the last 168 hours. Cardiac Enzymes: Recent Labs  Lab 05/12/21 0105  CKTOTAL 40   BNP (last 3 results) No results for input(s): PROBNP in the last 8760 hours. HbA1C: Recent Labs    05/12/21 0305  HGBA1C 7.1*   CBG: Recent Labs  Lab 05/12/21 1246 05/12/21 1636 05/12/21 2011 05/12/21 2329 05/13/21 0330  GLUCAP 124* 273* 201* 162*  168*   Lipid Profile: No results for input(s): CHOL, HDL, LDLCALC, TRIG, CHOLHDL, LDLDIRECT in the last 72 hours. Thyroid Function Tests: Recent Labs    05/12/21 1638  TSH 1.165   Anemia Panel: No results for input(s): VITAMINB12, FOLATE, FERRITIN, TIBC, IRON, RETICCTPCT in the last 72 hours. Sepsis Labs: Recent Labs  Lab 05/12/21 0105 05/12/21 0250  PROCALCITON 0.63  --   LATICACIDVEN  --  1.1    Recent Results (from the past 240 hour(s))  Resp Panel by RT-PCR (Flu A&B, Covid) Nasopharyngeal Swab     Status: None   Collection Time: 05/03/21  2:40 PM   Specimen: Nasopharyngeal Swab; Nasopharyngeal(NP) swabs in vial transport medium  Result Value Ref Range Status   SARS Coronavirus 2 by RT PCR NEGATIVE NEGATIVE Final    Comment: (NOTE) SARS-CoV-2 target nucleic acids are NOT DETECTED.  The SARS-CoV-2 RNA is generally detectable in upper respiratory specimens during the acute phase of infection. The lowest concentration of SARS-CoV-2  viral copies this assay can detect is 138 copies/mL. A negative result does not preclude SARS-Cov-2 infection and should not be used as the sole basis for treatment or other patient management decisions. A negative result may occur with  improper specimen collection/handling, submission of specimen other than nasopharyngeal swab, presence of viral mutation(s) within the areas targeted by this assay, and inadequate number of viral copies(<138 copies/mL). A negative result must be combined with clinical observations, patient history, and epidemiological information. The expected result is Negative.  Fact Sheet for Patients:  EntrepreneurPulse.com.au  Fact Sheet for Healthcare Providers:  IncredibleEmployment.be  This test is no t yet approved or cleared by the [place] FDA and  has been authorized for detection and/or diagnosis of SARS-CoV-2 by FDA under an Emergency Use Authorization (EUA). This EUA  will remain  in effect (meaning this test can be used) for the duration of the COVID-19 declaration under Section 564(b)(1) of the Act, 21 U.S.C.section 360bbb-3(b)(1), unless the authorization is terminated  or revoked sooner.       Influenza A by PCR NEGATIVE NEGATIVE Final   Influenza B by PCR NEGATIVE NEGATIVE Final    Comment: (NOTE) The Xpert Xpress SARS-CoV-2/FLU/RSV plus assay is intended as an aid in the diagnosis of influenza from Nasopharyngeal swab specimens and should not be used as a sole basis for treatment. Nasal washings and aspirates are unacceptable for Xpert Xpress SARS-CoV-2/FLU/RSV testing.  Fact Sheet for Patients: EntrepreneurPulse.com.au  Fact Sheet for Healthcare Providers: IncredibleEmployment.be  This test is not yet approved or cleared by the [place] FDA and has been authorized for detection and/or diagnosis of SARS-CoV-2 by FDA under an Emergency Use Authorization (EUA). This EUA will remain in effect (meaning this test can be used) for the duration of the COVID-19 declaration under Section 564(b)(1) of the Act, 21 U.S.C. section 360bbb-3(b)(1), unless the authorization is terminated or revoked.  Performed at [facility], [place] [address] [place], [place] [address]   Gastrointestinal Panel by PCR , Stool     Status: None   Collection Time: 05/03/21 10:30 PM   Specimen: Rectum; Stool  Result Value Ref Range Status   Campylobacter species NOT DETECTED NOT DETECTED Final   Plesimonas shigelloides NOT DETECTED NOT DETECTED Final   Salmonella species NOT DETECTED NOT DETECTED Final   Yersinia enterocolitica NOT DETECTED NOT DETECTED Final   Vibrio species NOT DETECTED NOT DETECTED Final   Vibrio cholerae NOT DETECTED NOT DETECTED Final   Enteroaggregative E coli (EAEC) NOT DETECTED NOT DETECTED Final   Enteropathogenic E coli (EPEC) NOT DETECTED NOT DETECTED Final   Enterotoxigenic E  coli (ETEC) NOT DETECTED NOT DETECTED Final   Shiga like toxin producing E coli (STEC) NOT DETECTED NOT DETECTED Final   Shigella/Enteroinvasive E coli (EIEC) NOT DETECTED NOT DETECTED Final   Cryptosporidium NOT DETECTED NOT DETECTED Final   Cyclospora cayetanensis NOT DETECTED NOT DETECTED Final   Entamoeba histolytica NOT DETECTED NOT DETECTED Final   Giardia lamblia NOT DETECTED NOT DETECTED Final   Adenovirus F40/41 NOT DETECTED NOT DETECTED Final   Astrovirus NOT DETECTED NOT DETECTED Final   Norovirus GI/GII NOT DETECTED NOT DETECTED Final   Rotavirus A NOT DETECTED NOT DETECTED Final   Sapovirus (I, II, IV, and V) NOT DETECTED NOT DETECTED Final    Comment: Performed at [facility], [address] [place], [place] [address]  C Difficile Quick Screen w PCR reflex     Status: None   Collection  Time: 05/03/21 10:30 PM   Specimen: Rectum; Stool  Result Value Ref Range Status   C Diff antigen NEGATIVE NEGATIVE Final   C Diff toxin NEGATIVE NEGATIVE Final   C Diff interpretation No C. difficile detected.  Final    Comment: Performed at [facility], [place] [address] [place], [place] [address]  Blood culture (routine x 2)     Status: None (Preliminary result)   Collection Time: 05/11/21  7:28 PM   Specimen: BLOOD  Result Value Ref Range Status   Specimen Description   Final    BLOOD RIGHT ANTECUBITAL Performed at [place] [address], [place], [place] [address]    Special Requests   Final    BOTTLES DRAWN AEROBIC AND ANAEROBIC Blood Culture results may not be optimal due to an inadequate volume of blood received in culture bottles Performed at [place] [address] [place], [place] [address]    Culture   Final    NO GROWTH 1 DAY Performed at [facility], [place] [address] [place], [place] [address]    Report Status PENDING  Incomplete  Blood culture (routine x 2)     Status: None  (Preliminary result)   Collection Time: 05/12/21  2:50 AM   Specimen: BLOOD  Result Value Ref Range Status   Specimen Description   Final    BLOOD BLOOD RIGHT ARM Performed at [place] [address] [place], [place] [address]    Special Requests   Final    BOTTLES DRAWN AEROBIC AND ANAEROBIC Blood Culture adequate volume Performed at [place] [address], [place], [place] [address]    Culture   Final    NO GROWTH 1 DAY Performed at [facility], [place] [address], [place], [place] [address]    Report Status PENDING  Incomplete  Culture, Urine     Status: Abnormal   Collection Time: 05/12/21  4:35 AM   Specimen: Urine, Clean Catch  Result Value Ref Range Status   Specimen Description   Final    URINE, CLEAN CATCH Performed at [facility], [place] [address] [place], [place] [address]    Special Requests   Final    NONE Performed at [facility], [place] [address], [place], [place] [address]    Culture (A)  Final    <10,000 COLONIES/mL INSIGNIFICANT GROWTH Performed at [place] [address], [place], [place] [address]    Report Status 05/13/2021 FINAL  Final         Radiology Studies: DG CHEST PORT 1 VIEW  Result Date: 05/12/2021 CLINICAL DATA:  Body aches EXAM: PORTABLE CHEST 1 VIEW COMPARISON:  01/08/2019 FINDINGS: The heart size and mediastinal contours are within normal limits. Both lungs are clear. Surgical hardware in the cervical spine. IMPRESSION: No active disease. Electronically Signed   By: Donavan Foil M.D.   On: 05/12/2021 01:16   DG Shoulder Left  Result Date: 05/11/2021 CLINICAL DATA:  Left shoulder pain for several weeks, recent arthrocentesis EXAM: LEFT SHOULDER - 2+ VIEW COMPARISON:  None. FINDINGS: Frontal and transscapular views of the left shoulder demonstrate no acute displaced fractures. Moderate hypertrophic changes are seen at the acromion  process. Remaining joint spaces are well preserved. Left chest is clear. IMPRESSION: 1. Prominent spurring of the acromion process. No acute bony abnormality. Electronically Signed   By: Diana Eves.D.  On: 05/11/2021 20:28   DG Knee Complete 4 Views Left  Result Date: 05/11/2021 CLINICAL DATA:  70 year old female with left knee swelling. EXAM: LEFT KNEE - COMPLETE 4+ VIEW COMPARISON:  None. FINDINGS: There is no acute fracture or dislocation. The bones are osteopenic. There is mild arthritic changes with meniscal chondrocalcinosis. There is moderate narrowing of the patellofemoral compartment with spurring. Moderate size suprapatellar effusion noted. The soft tissues are otherwise unremarkable. IMPRESSION: 1. No acute fracture or dislocation. 2. Osteopenia with mild arthritic changes and meniscal chondrocalcinosis. 3. Moderate suprapatellar effusion. Electronically Signed   By: Anner Crete M.D.   On: 05/11/2021 18:04        Scheduled Meds: . amLODipine  5 mg Oral Daily  . insulin aspart  0-9 Units Subcutaneous Q4H  . losartan  100 mg Oral Daily  . metoprolol succinate  50 mg Oral BID  . venlafaxine  150 mg Oral QHS  . venlafaxine  225 mg Oral Daily   Continuous Infusions: . ceFEPime (MAXIPIME) IV Stopped (05/13/21 0433)  . vancomycin            Aline August, MD [facility] 05/13/2021, 7:43 AM

## 2021-05-14 LAB — BASIC METABOLIC PANEL
Anion gap: 9 (ref 5–15)
BUN: 15 mg/dL (ref 8–23)
CO2: 25 mmol/L (ref 22–32)
Calcium: 8.3 mg/dL — ABNORMAL LOW (ref 8.9–10.3)
Chloride: 103 mmol/L (ref 98–111)
Creatinine, Ser: 0.87 mg/dL (ref 0.44–1.00)
GFR, Estimated: >60 mL/min (ref >60)
Glucose, Bld: 142 mg/dL — ABNORMAL HIGH (ref 70–99)
Potassium: 3.3 mmol/L — ABNORMAL LOW (ref 3.5–5.1)
Sodium: 137 mmol/L (ref 135–145)

## 2021-05-14 LAB — GLUCOSE, CAPILLARY
Glucose-Capillary: 125 mg/dL — ABNORMAL HIGH (ref 70–99)
Glucose-Capillary: 150 mg/dL — ABNORMAL HIGH (ref 70–99)
Glucose-Capillary: 164 mg/dL — ABNORMAL HIGH (ref 70–99)
Glucose-Capillary: 215 mg/dL — ABNORMAL HIGH (ref 70–99)
Glucose-Capillary: 248 mg/dL — ABNORMAL HIGH (ref 70–99)
Glucose-Capillary: 256 mg/dL — ABNORMAL HIGH (ref 70–99)

## 2021-05-14 LAB — C-REACTIVE PROTEIN: CRP: 7.4 mg/dL — ABNORMAL HIGH (ref <1.0)

## 2021-05-14 LAB — MAGNESIUM: Magnesium: 1.5 mg/dL — ABNORMAL LOW (ref 1.7–2.4)

## 2021-05-14 MED ORDER — POTASSIUM CHLORIDE CRYS ER 10 MEQ PO TBCR
40.0000 meq | EXTENDED_RELEASE_TABLET | ORAL | Status: DC
  Administered 2021-05-14: 40 meq via ORAL
  Filled 2021-05-14: qty 4

## 2021-05-14 MED ORDER — AMLODIPINE BESYLATE 5 MG PO TABS: 5.0000 mg | ORAL_TABLET | Freq: Every day | ORAL | 0 refills | Status: AC

## 2021-05-14 MED ORDER — LOSARTAN POTASSIUM 100 MG PO TABS: 100.0000 mg | ORAL_TABLET | Freq: Every day | ORAL | 0 refills | Status: AC

## 2021-05-14 MED ORDER — MAGNESIUM OXIDE 400 MG PO CAPS: 400.0000 mg | ORAL_CAPSULE | Freq: Two times a day (BID) | ORAL | 0 refills | Status: AC

## 2021-05-14 MED ORDER — MAGNESIUM SULFATE 2 GM/50ML IV SOLN
2.0000 g | Freq: Once | INTRAVENOUS | Status: AC
  Administered 2021-05-14: 2 g via INTRAVENOUS
  Filled 2021-05-14: qty 50

## 2021-05-14 MED ORDER — POTASSIUM CHLORIDE CRYS ER 20 MEQ PO TBCR: 20.0000 meq | EXTENDED_RELEASE_TABLET | Freq: Two times a day (BID) | ORAL | 0 refills | Status: DC

## 2021-05-14 NOTE — TOC Initial Note (Signed)
Transition of Care [facility]) - Initial/Assessment Note    Patient Details  Name: Alexis Ramos MRN: [id number] Date of Birth: 1951-05-10  Transition of Care [facility]) CM/SW Contact:    Trish Mage, LCSW Phone Number: 05/14/2021, 10:20 AM  Clinical Narrative:    Patient seen in follow up to PT recommendation of [facility].  Alexis Ramos lives alone in [place], has support of neighbors and friends.  Family lives in [place].  She has DME rollator, wheelchair, walker.  She can get around fine in the home, but the five steps she has to climb to get in are currently keeping her home bound.  She is open to [facility] PT, no preference of agency.  Contacted Cindie with [facility] who agreed to see patient for services. Alexis Ramos says her brother will give her a ride home and help her get into her house.  No further needs identified. TOC will continue to follow during the course of hospitalization.                Expected Discharge Plan: [place] Barriers to Discharge: No Barriers Identified   Patient Goals and CMS Choice     Choice offered to / list presented to : Patient  Expected Discharge Plan and Services Expected Discharge Plan: [place]   Discharge Planning Services: CM Consult Post Acute Care Choice: [place] arrangements for the past 2 months: Single Family Home Expected Discharge Date: 05/14/21                                    Prior Living Arrangements/Services Living arrangements for the past 2 months: Single Family Home Lives with:: Self Patient language and need for interpreter reviewed:: Yes        Need for Family Participation in Patient Care: No (Comment) Care giver support system in place?: Yes (comment) Current home services: DME Criminal Activity/Legal Involvement Pertinent to Current Situation/Hospitalization: No - Comment as needed  Activities of Daily Living Home Assistive Devices/Equipment: Dentures (specify  type),Eyeglasses (upper/lower partial plates) ADL Screening (condition at time of admission) Patient's cognitive ability adequate to safely complete daily activities?: Yes Is the patient deaf or have difficulty hearing?: No Does the patient have difficulty seeing, even when wearing glasses/contacts?: No Does the patient have difficulty concentrating, remembering, or making decisions?: No Patient able to express need for assistance with ADLs?: Yes Does the patient have difficulty dressing or bathing?: No Independently performs ADLs?: Yes (appropriate for developmental age) Does the patient have difficulty walking or climbing stairs?: Yes (secondary to left knee, back and shoulder pain) Weakness of Legs: Left Weakness of Arms/Hands: None  Permission Sought/Granted                  Emotional Assessment Appearance:: Appears stated age Attitude/Demeanor/Rapport: Engaged Affect (typically observed): Appropriate Orientation: : Oriented to Self,Oriented to Place,Oriented to  Time,Oriented to Situation Alcohol / Substance Use: Not Applicable Psych Involvement: No (comment)  Admission diagnosis:  Hypokalemia [E87.6] Joint swelling [M25.40] Pain [R52] SIRS (systemic inflammatory response syndrome) ([place]) [R65.10] Patient Active Problem List   Diagnosis Date Noted  . SIRS (systemic inflammatory response syndrome) ([place]) 05/11/2021  . Colitis 05/04/2021  . AKI (acute kidney injury) ([place]) 05/03/2021  . Colitis presumed infectious 05/03/2021  . Hypokalemia 05/03/2021  . Crohn's disease involving terminal ileum ([place]) 12/07/2018  . Depression 12/07/2018  . Nonalcoholic steatohepatitis  08/30/2016  . Chronic cholecystitis without calculus s/p lap cholecystectomy 08/10/2016 08/10/2016  . Chronic kidney disease, stage III (moderate) ([place]) 03/27/2015  . Dyslipidemia 03/25/2015  . History of resection of small bowel 03/25/2015  . DES exposure in utero   . Hypertension   . Crohn's disease ([place])    . Menopausal symptoms    PCP:  [name], MD Pharmacy:   [facility], [place] [address] Phone: [phone] Fax: [phone]     Social Determinants of Health (SDOH) Interventions    Readmission Risk Interventions No flowsheet data found.

## 2021-05-14 NOTE — Discharge Summary (Signed)
Physician Discharge Summary  Alexis Ramos OFB:[id number] DOB: 11/03/1951 DOA: 05/11/2021  PCP: [name], MD  Admit date: 05/11/2021 Discharge date: 05/14/2021  Admitted From: Home Disposition: Home  Recommendations for Outpatient Follow-up:  1. Follow up with PCP in 1 week with repeat CBC/BMP 2. Outpatient follow-up with orthopedics/Dr. Duane Lope 3. Follow up in ED if symptoms worsen or new appear   Home Health: Home with PT Equipment/Devices: None  Discharge Condition: Stable CODE STATUS: Full Diet recommendation: Heart healthy  Brief/Interim Summary: 70 year old female with history of Crohn's disease s/p ileocecectomy, type 2 diabetes, hypertension, hyperlipidemia, SBO, NASH, cholecystitis s/p lap chole, s/p appendectomy with recent hospitalization from 05/03/2021-05/05/2021 for diarrhea/colitis treated medically with negative GI PCR and stool for C. difficile and subsequent discharge off antibiotics presented on 05/11/2021 with severe left knee pain along with fever and chills.  She was found to have left knee effusion with question of left shoulder effusion.  She was started on broad-spectrum antibiotics.  Orthopedics was consulted.  She had left knee aspiration by orthopedics.  Left shoulder aspiration was attempted but it was a dry tap.  During the hospitalizing, her condition has improved.  Orthopedics followed up with the patient and thought that patient did not have a septic knee.  PT recommended home health PT.  She is afebrile and hemodynamically stable.  She will be discharged home today off antibiotics with outpatient follow-up with PCP and orthopedics.  Discharge Diagnoses:   SIRS: Resolved Leukocytosis Left knee pain and swelling with concern for septic joint -Status post left knee aspiration by orthopedics on admission.  Synovial fluid cultures negative so far.  Currently on broad-spectrum antibiotics.  Orthopedics subsequently evaluated the patient and was of the  opinion that there was no concern for septic knee or shoulder at this time.    Will hence DC antibiotics. -Leukocytosis is improving.   -Currently hemodynamically stable.  Knee pain is getting better.  Left shoulder aspiration was attempted but it was a dry tap -PT recommended home health PT. -She will be discharged home today off antibiotics with outpatient follow-up with PCP and orthopedics.  Recent diarrhea/colitis -recent hospitalization from 05/03/2021-05/05/2021 for diarrhea/colitis treated medically with negative GI PCR and stool for C. difficile and subsequent discharge off antibiotics as per GI recommendations  Acute kidney injury -Treated with IV fluids.  Resolved.  Outpatient follow-up  Hypokalemia -Replace.    Continue supplementation on discharge.  Outpatient follow-up.  Hypomagnesemia -Replace.  Continue supplementation on discharge.  Outpatient follow-up.  Crohn's disease with history of ileocecectomy -Outpatient follow-up with GI.  Hypertension -Blood pressure on the higher side.  Continue metoprolol, losartan and amlodipine  Depression -Continue venlafaxine.  Diabetes mellitus type 2 -Outpatient follow-up.  Patient does not want to have carb modified diet.  Nonalcoholic steatohepatitis -Stable.  Outpatient follow-up with GI   Discharge Instructions  Discharge Instructions    Diet - low sodium heart healthy   Complete by: As directed    Increase activity slowly   Complete by: As directed      Allergies as of 05/14/2021      Reactions   Nsaids Other (See Comments)   H/o Crohn's disease   Penicillins Anaphylaxis   Pt grandmother died from it and her brother had anaphylaxis so she was told never to take.  Has patient had a PCN reaction causing immediate rash, facial/tongue/throat swelling, SOB or lightheadedness with hypotension: No Has patient had a PCN reaction causing severe rash involving mucus membranes or skin necrosis: No  Has patient had a  PCN reaction that required hospitalization No Has patient had a PCN reaction occurring within the last 10 years: No If all of the above answers are "NO", then may pr   Advil [ibuprofen]    Bleeding.    Asa [aspirin]    Hemorrhage.      Daypro [oxaprozin]    Bleeding.      Medication List    TAKE these medications   amLODipine 5 MG tablet Commonly known as: NORVASC Take 1 tablet (5 mg total) by mouth daily.   budesonide 3 MG 24 hr capsule Commonly known as: ENTOCORT EC Take 3 mg by mouth every morning.   cyanocobalamin 1000 MCG/ML injection Commonly known as: (VITAMIN B-12) Inject 1,000 mcg into the muscle every 21 ( twenty-one) days.   fenofibrate 54 MG tablet Take 54 mg by mouth daily.   losartan 100 MG tablet Commonly known as: COZAAR Take 1 tablet (100 mg total) by mouth daily.   Magnesium Oxide 400 MG Caps Take 1 capsule (400 mg total) by mouth 2 (two) times daily for 10 days.   metoprolol succinate 100 MG 24 hr tablet Commonly known as: TOPROL-XL Take 50 mg by mouth 2 (two) times daily.   Mitigare 0.6 MG Caps Generic drug: Colchicine Take 0.6 capsules by mouth daily as needed (gout).   omeprazole 20 MG capsule Commonly known as: PRILOSEC Take 20 mg by mouth daily as needed (heartburn).   pioglitazone 15 MG tablet Commonly known as: ACTOS Take 15 mg by mouth daily.   potassium chloride SA 20 MEQ tablet Commonly known as: KLOR-CON Take 1 tablet (20 mEq total) by mouth 2 (two) times daily for 10 days.   PreserVision AREDS Caps Take 1 capsule by mouth daily.   saccharomyces boulardii 250 MG capsule Commonly known as: FLORASTOR Take 1 capsule (250 mg total) by mouth 2 (two) times daily.   triamcinolone 55 MCG/ACT Aero nasal inhaler Commonly known as: NASACORT Place 1 spray into the nose daily as needed (allergy).   venlafaxine 75 MG tablet Commonly known as: EFFEXOR Take 150-225 mg by mouth See admin instructions. Takes 225 mg in the morning and  150 mg at bedtime   Vitamin E 400 units Tabs Take 800 Units by mouth daily.       Follow-up Information    [name], MD. Schedule an appointment as soon as possible for a visit in 1 week(s).   Specialty: Internal Medicine Why: With repeat CBC/BMP Contact information: [place] [address] [phone]        Melina Schools, MD. Schedule an appointment as soon as possible for a visit in 1 week(s).   Specialty: Orthopedic Surgery Contact information: [address] [place] [address] [phone]              Allergies  Allergen Reactions  . Nsaids Other (See Comments)    H/o Crohn's disease  . Penicillins Anaphylaxis    Pt grandmother died from it and her brother had anaphylaxis so she was told never to take.  Has patient had a PCN reaction causing immediate rash, facial/tongue/throat swelling, SOB or lightheadedness with hypotension: No Has patient had a PCN reaction causing severe rash involving mucus membranes or skin necrosis: No Has patient had a PCN reaction that required hospitalization No Has patient had a PCN reaction occurring within the last 10 years: No If all of the above answers are "NO", then may pr  . Advil [Ibuprofen]  Bleeding.   Diona Fanti [Aspirin]     Hemorrhage.     Lanae Crumbly [Oxaprozin]     Bleeding.    Consultations:  Orthopedics   Procedures/Studies: CT ABDOMEN PELVIS WO CONTRAST  Result Date: 05/03/2021 CLINICAL DATA:  70 year old female with leukocytosis. Concern for colitis. EXAM: CT ABDOMEN AND PELVIS WITHOUT CONTRAST TECHNIQUE: Multidetector CT imaging of the abdomen and pelvis was performed following the standard protocol without IV contrast. COMPARISON:  CT abdomen pelvis dated 04/30/2021. FINDINGS: Evaluation of this exam is limited in the absence of intravenous contrast. Lower chest: The visualized lung bases are clear. There is coronary vascular calcification. No intra-abdominal free  air or free fluid. Hepatobiliary: Diffuse fatty liver. No intrahepatic biliary dilatation. Cholecystectomy. No retained calcified stone noted in the central CBD. Pancreas: Unremarkable. No pancreatic ductal dilatation or surrounding inflammatory changes. Spleen: Normal in size without focal abnormality. Adrenals/Urinary Tract: The adrenal glands are unremarkable. There is no hydronephrosis on either side. Vascular calcification versus punctate nonobstructing left renal upper pole calculus. Small focus of parenchymal calcification noted in the interpolar right kidney as well as in the inferior pole of the right kidney. Indeterminate small hypodense lesions from the left kidney measure up to 15 mm, suboptimally characterized on this noncontrast CT but likely cysts. Ultrasound may provide better characterization on a nonemergent/outpatient basis if clinically indicated. The visualized ureters appear unremarkable. The urinary bladder is partially distended and grossly unremarkable. Stomach/Bowel: There is loose stool throughout the colon compatible with diarrheal state. There is mild thickening of the colonic wall without significant pericolonic stranding which may represent mild residual colitis. There is no bowel obstruction. The appendix is not visualized with certainty. No inflammatory changes identified in the right lower quadrant. Vascular/Lymphatic: Mild aortoiliac atherosclerotic disease. The IVC is unremarkable. No portal venous gas. There is no adenopathy. Reproductive: The uterus is anteverted. There is a 3.6 cm right adnexal cyst as seen previously. Recommend follow-up US in 6-12 months. Note: This recommendation does not apply to premenarchal patients and to those with increased risk (genetic, family history, elevated tumor markers or other high-risk factors) of ovarian cancer. Reference: JACR 2020 Feb; 17(2):248-254 Other: Postsurgical changes of anterior abdominal wall. Musculoskeletal: Degenerative  changes of the spine. No acute osseous pathology. IMPRESSION: 1. Diarrheal state with probable mild colitis. Correlation with clinical exam and stool cultures recommended. No bowel obstruction. 2. Fatty liver. 3. Aortic Atherosclerosis (ICD10-I70.0). Electronically Signed   By: Anner Crete M.D.   On: 05/03/2021 15:23   CT ABDOMEN PELVIS WO CONTRAST  Result Date: 04/30/2021 CLINICAL DATA:  Diarrhea. Patient reports weakness. Patient reports for 6 months they have been following a lesion in her pelvis. Electronic medical records states history of Crohn's disease. EXAM: CT ABDOMEN AND PELVIS WITHOUT CONTRAST TECHNIQUE: Multidetector CT imaging of the abdomen and pelvis was performed following the standard protocol without IV contrast. COMPARISON:  Most recent available CT 07/24/2020 FINDINGS: Lower chest: Lung bases are clear. No acute airspace disease or pleural effusion. Heart is normal in size. Hepatobiliary: Diffusely decreased hepatic density consistent with steatosis. No evidence of focal liver lesion on noncontrast exam Clips in the gallbladder fossa postcholecystectomy. No biliary dilatation. Pancreas: Mild parenchymal atrophy. No ductal dilatation or inflammation. Spleen: Normal in size without focal abnormality. Adrenals/Urinary Tract: No adrenal nodule. No hydronephrosis. Punctate nonobstructing stone in the upper left kidney. Similar cysts in the upper left kidney to prior exam. No significant perinephric edema. No ureteral calculi. Urinary bladder is completely empty and not well  assessed. Stomach/Bowel: Administered enteric contrast reaches the colon. The stomach is decompressed without evidence of wall thickening or gastric inflammation. Persistent but improved wall thickening of the distal and terminal ileum from prior exam, with improved perienteric fat stranding and inflammation. No obstruction. No additional areas of small bowel inflammation or wall thickening. Portions of normal appendix  are visualized. Occasional fluid and contrast levels throughout the colon with minimal liquid stool distally. There is mild wall thickening of the mid and distal sigmoid, series 2, image 58 and image 65. No significant pericolonic inflammation. Vascular/Lymphatic: Normal caliber abdominal aorta. Mild atherosclerosis. Few prominent periportal nodes measuring up to 9 mm, typically reactive, not significantly changed from prior. No enlarged lymph nodes in the abdomen or pelvis by size criteria. Reproductive: Normal for age uterine atrophy. Right adnexal cyst measures 3.3 cm and measures simple fluid density, series 2, image 56. This previously measured 3.6 cm. Left ovary is quiescent. Other: No ascites or free fluid. Postsurgical change of the right abdominal wall. No intra-abdominal fluid collection or abscess. Musculoskeletal: Similar degenerative change in the lower lumbar spine. Avascular necrosis of the right femoral head, chronic. There are no acute or suspicious osseous abnormalities. IMPRESSION: 1. Mild wall thickening of the mid and distal sigmoid colon without significant pericolonic inflammation, suspicious for mild acute colitis. Mild liquid stool throughout the colon. 2. Persistent but improved wall thickening of the distal and terminal ileum from prior exam, with improved perienteric fat stranding and inflammation. 3. Hepatic steatosis. 4. Nonobstructing left nephrolithiasis. 5. Right adnexal cyst measures 3.3 cm, previously 3.6 cm. Given inability to assess for enhancement characteristics this is considered a simple appearing cyst, not adequately characterized. Prompt pelvic ultrasound characterization is recommended. Aortic Atherosclerosis (ICD10-I70.0). Electronically Signed   By: Keith Rake M.D.   On: 04/30/2021 19:38   DG CHEST PORT 1 VIEW  Result Date: 05/12/2021 CLINICAL DATA:  Body aches EXAM: PORTABLE CHEST 1 VIEW COMPARISON:  01/08/2019 FINDINGS: The heart size and mediastinal contours  are within normal limits. Both lungs are clear. Surgical hardware in the cervical spine. IMPRESSION: No active disease. Electronically Signed   By: Donavan Foil M.D.   On: 05/12/2021 01:16   DG Shoulder Left  Result Date: 05/11/2021 CLINICAL DATA:  Left shoulder pain for several weeks, recent arthrocentesis EXAM: LEFT SHOULDER - 2+ VIEW COMPARISON:  None. FINDINGS: Frontal and transscapular views of the left shoulder demonstrate no acute displaced fractures. Moderate hypertrophic changes are seen at the acromion process. Remaining joint spaces are well preserved. Left chest is clear. IMPRESSION: 1. Prominent spurring of the acromion process. No acute bony abnormality. Electronically Signed   By: Randa Ngo M.D.   On: 05/11/2021 20:28   DG Knee Complete 4 Views Left  Result Date: 05/11/2021 CLINICAL DATA:  70 year old female with left knee swelling. EXAM: LEFT KNEE - COMPLETE 4+ VIEW COMPARISON:  None. FINDINGS: There is no acute fracture or dislocation. The bones are osteopenic. There is mild arthritic changes with meniscal chondrocalcinosis. There is moderate narrowing of the patellofemoral compartment with spurring. Moderate size suprapatellar effusion noted. The soft tissues are otherwise unremarkable. IMPRESSION: 1. No acute fracture or dislocation. 2. Osteopenia with mild arthritic changes and meniscal chondrocalcinosis. 3. Moderate suprapatellar effusion. Electronically Signed   By: Anner Crete M.D.   On: 05/11/2021 18:04       Subjective: Patient seen and examined at bedside.  Patient feels better and thinks that she would do okay at home.  No overnight fever, vomiting, abdominal  pain reported.  Left knee pain is improving.  Discharge Exam: Vitals:   05/13/21 2308 05/14/21 0402  BP: (!) 144/78 (!) 160/80  Pulse: 85 66  Resp:  18  Temp:  98.7 F (37.1 C)  SpO2: 98% 97%    General: Pt is alert, awake, not in acute distress Cardiovascular: rate controlled, S1/S2  + Respiratory: bilateral decreased breath sounds at bases Abdominal: Soft, NT, ND, bowel sounds + Extremities: Very minimal left knee swelling with hardly any erythema or tenderness; no cyanosis    The results of significant diagnostics from this hospitalization (including imaging, microbiology, ancillary and laboratory) are listed below for reference.     Microbiology: Recent Results (from the past 240 hour(s))  Blood culture (routine x 2)     Status: None (Preliminary result)   Collection Time: 05/11/21  7:28 PM   Specimen: BLOOD  Result Value Ref Range Status   Specimen Description   Final    BLOOD RIGHT ANTECUBITAL Performed at [facility] [address] [place], [place] [address]    Special Requests   Final    BOTTLES DRAWN AEROBIC AND ANAEROBIC Blood Culture results may not be optimal due to an inadequate volume of blood received in culture bottles Performed at [facility] [address], [place], [place] [address]    Culture   Final    NO GROWTH 2 DAYS Performed at [facility] [address], [place], [place] [address]    Report Status PENDING  Incomplete  Blood culture (routine x 2)     Status: None (Preliminary result)   Collection Time: 05/12/21  2:50 AM   Specimen: BLOOD  Result Value Ref Range Status   Specimen Description   Final    BLOOD BLOOD RIGHT ARM Performed at [facility] [address], [place], [place] [address]    Special Requests   Final    BOTTLES DRAWN AEROBIC AND ANAEROBIC Blood Culture adequate volume Performed at [facility] [address], [place], [place] [address]    Culture   Final    NO GROWTH 2 DAYS Performed at [facility] [address] [place], [place] [address]    Report Status PENDING  Incomplete  Culture, Urine     Status: Abnormal   Collection Time: 05/12/21  4:35 AM   Specimen: Urine, Clean Catch  Result Value Ref Range  Status   Specimen Description   Final    URINE, CLEAN CATCH Performed at [facility], [place] [address] [place], [place]  [address]    Special Requests   Final    NONE Performed at [facility], [place] [address], [place], [place] [address]    Culture (A)  Final    <10,000 COLONIES/mL INSIGNIFICANT GROWTH Performed at [facility] [address], [place],  [address]    Report Status 05/13/2021 FINAL  Final     Labs: BNP (last 3 results) No results for input(s): BNP in the last 8760 hours. Basic Metabolic Panel: Recent Labs  Lab 05/12/21 0105 05/12/21 0250 05/12/21 0821 05/12/21 1638 05/13/21 0518 05/14/21 0517  NA 136 138 137  --  139 137  K 2.2* 2.5* 2.3* 2.6* 2.9* 3.3*  CL 96* 102 98  --  102 103  CO2 27 24 30   --  30 25  GLUCOSE 118* 125* 155*  --  140* 142*  BUN 12 10 10   --  13 15  CREATININE 1.14* 0.84 0.94  --  0.91 0.87  CALCIUM 8.6* 7.4* 7.8*  --  7.9* 8.3*  MG  --  1.2* 1.3* 2.4 2.0 1.5*  PHOS  --   --  2.2*  --   --   --    Liver Function Tests: Recent Labs  Lab 05/12/21 0821 05/13/21 0518  AST 11* 10*  ALT 15 13  ALKPHOS 49 54  BILITOT 0.7 0.4  PROT 6.4* 6.1*  ALBUMIN 2.6* 2.5*   No results for input(s): LIPASE, AMYLASE in the last 168 hours. No results for input(s): AMMONIA in the last 168 hours. CBC: Recent Labs  Lab 05/11/21 1803 05/12/21 0821 05/13/21 0518  WBC 23.9* 18.1* 14.4*  NEUTROABS 19.0* 14.8* 11.1*  HGB 11.5* 10.3* 9.7*  HCT 34.7* 31.2* 30.4*  MCV 87.8 87.9 90.7  PLT 254 226 227   Cardiac Enzymes: Recent Labs  Lab 05/12/21 0105  CKTOTAL 40   BNP: Invalid input(s): POCBNP CBG: Recent Labs  Lab 05/13/21 1552 05/13/21 2027 05/13/21 2356 05/14/21 0359 05/14/21 0741  GLUCAP 199* 256* 164* 125* 150*   D-Dimer No results for input(s): DDIMER in the last 72 hours. Hgb A1c Recent Labs    05/12/21 0305  HGBA1C 7.1*   Lipid Profile No results for input(s): CHOL,  HDL, LDLCALC, TRIG, CHOLHDL, LDLDIRECT in the last 72 hours. Thyroid function studies Recent Labs    05/12/21 1638  TSH 1.165   Anemia work up No results for input(s): VITAMINB12, FOLATE, FERRITIN, TIBC, IRON, RETICCTPCT in the last 72 hours. Urinalysis    Component Value Date/Time   COLORURINE STRAW (A) 05/12/2021 0435   APPEARANCEUR CLEAR 05/12/2021 0435   LABSPEC 1.008 05/12/2021 0435   PHURINE 6.0 05/12/2021 0435   GLUCOSEU NEGATIVE 05/12/2021 0435   HGBUR NEGATIVE 05/12/2021 0435   BILIRUBINUR NEGATIVE 05/12/2021 0435   KETONESUR NEGATIVE 05/12/2021 0435   PROTEINUR 30 (A) 05/12/2021 0435   UROBILINOGEN 0.2 05/16/2012 1226   NITRITE NEGATIVE 05/12/2021 0435   LEUKOCYTESUR NEGATIVE 05/12/2021 0435   Sepsis Labs Invalid input(s): PROCALCITONIN,  WBC,  LACTICIDVEN Microbiology Recent Results (from the past 240 hour(s))  Blood culture (routine x 2)     Status: None (Preliminary result)   Collection Time: 05/11/21  7:28 PM   Specimen: BLOOD  Result Value Ref Range Status   Specimen Description   Final    BLOOD RIGHT ANTECUBITAL Performed at [facility], [place] [address] [place], [place] [address]    Special Requests   Final    BOTTLES DRAWN AEROBIC AND ANAEROBIC Blood Culture results may not be optimal due to an inadequate volume of blood received in culture bottles Performed at [facility] [address], [place], [place] [address]    Culture   Final    NO GROWTH 2 DAYS Performed at [facility] [address] [place], [place] [address]    Report Status PENDING  Incomplete  Blood culture (routine x 2)     Status: None (Preliminary result)   Collection Time: 05/12/21  2:50 AM   Specimen: BLOOD  Result Value Ref Range Status   Specimen Description   Final    BLOOD BLOOD RIGHT ARM Performed at [facility] [address] [place], [place] [address]    Special Requests   Final    BOTTLES DRAWN  AEROBIC AND ANAEROBIC Blood Culture adequate volume Performed at [facility] [address]  [place], [place] [address]    Culture   Final    NO GROWTH 2 DAYS Performed at [facility], [place] [address], [place], [place] [address]    Report Status PENDING  Incomplete  Culture, Urine     Status: Abnormal   Collection Time: 05/12/21  4:35 AM   Specimen: Urine, Clean Catch  Result Value Ref Range Status   Specimen Description   Final    URINE, CLEAN CATCH Performed at [facility], [place] [address], [place], [place] [address]    Special Requests   Final    NONE Performed at [facility], [place] [address] [place], [place] [address]    Culture (A)  Final    <10,000 COLONIES/mL INSIGNIFICANT GROWTH Performed at [facility] [address], [place],  [address]    Report Status 05/13/2021 FINAL  Final     Time coordinating discharge: 35 minutes  SIGNED:   Aline August, MD  [facility] 05/14/2021, 9:47 AM

## 2021-05-14 NOTE — Care Management Important Message (Signed)
Important Message  Patient Details IM Letter given to the Patient. Name: Alexis Ramos MRN: [id number] Date of Birth: 1951-03-31   Medicare Important Message Given:  Yes     Kerin Salen 05/14/2021, 9:53 AM

## 2021-05-17 LAB — CULTURE, BLOOD (ROUTINE X 2)
Culture: NO GROWTH
Culture: NO GROWTH
Special Requests: ADEQUATE

## 2021-05-18 DIAGNOSIS — M542 Cervicalgia: Secondary | ICD-10-CM | POA: Diagnosis not present

## 2021-05-20 DIAGNOSIS — E1122 Type 2 diabetes mellitus with diabetic chronic kidney disease: Secondary | ICD-10-CM | POA: Diagnosis not present

## 2021-05-20 DIAGNOSIS — M19011 Primary osteoarthritis, right shoulder: Secondary | ICD-10-CM | POA: Diagnosis not present

## 2021-05-20 DIAGNOSIS — N1831 Chronic kidney disease, stage 3a: Secondary | ICD-10-CM | POA: Diagnosis not present

## 2021-05-20 DIAGNOSIS — M11262 Other chondrocalcinosis, left knee: Secondary | ICD-10-CM | POA: Diagnosis not present

## 2021-05-20 DIAGNOSIS — M76892 Other specified enthesopathies of left lower limb, excluding foot: Secondary | ICD-10-CM | POA: Diagnosis not present

## 2021-05-20 DIAGNOSIS — M1712 Unilateral primary osteoarthritis, left knee: Secondary | ICD-10-CM | POA: Diagnosis not present

## 2021-05-20 DIAGNOSIS — M129 Arthropathy, unspecified: Secondary | ICD-10-CM | POA: Diagnosis not present

## 2021-05-20 DIAGNOSIS — M47812 Spondylosis without myelopathy or radiculopathy, cervical region: Secondary | ICD-10-CM | POA: Diagnosis not present

## 2021-05-20 DIAGNOSIS — M19012 Primary osteoarthritis, left shoulder: Secondary | ICD-10-CM | POA: Diagnosis not present

## 2021-05-21 DIAGNOSIS — E785 Hyperlipidemia, unspecified: Secondary | ICD-10-CM | POA: Diagnosis not present

## 2021-05-25 DIAGNOSIS — M1712 Unilateral primary osteoarthritis, left knee: Secondary | ICD-10-CM | POA: Diagnosis not present

## 2021-05-25 DIAGNOSIS — M76892 Other specified enthesopathies of left lower limb, excluding foot: Secondary | ICD-10-CM | POA: Diagnosis not present

## 2021-05-25 DIAGNOSIS — M19012 Primary osteoarthritis, left shoulder: Secondary | ICD-10-CM | POA: Diagnosis not present

## 2021-05-25 DIAGNOSIS — M129 Arthropathy, unspecified: Secondary | ICD-10-CM | POA: Diagnosis not present

## 2021-05-25 DIAGNOSIS — N1831 Chronic kidney disease, stage 3a: Secondary | ICD-10-CM | POA: Diagnosis not present

## 2021-05-25 DIAGNOSIS — M11262 Other chondrocalcinosis, left knee: Secondary | ICD-10-CM | POA: Diagnosis not present

## 2021-05-25 DIAGNOSIS — M19011 Primary osteoarthritis, right shoulder: Secondary | ICD-10-CM | POA: Diagnosis not present

## 2021-05-25 DIAGNOSIS — M47812 Spondylosis without myelopathy or radiculopathy, cervical region: Secondary | ICD-10-CM | POA: Diagnosis not present

## 2021-05-25 DIAGNOSIS — E1122 Type 2 diabetes mellitus with diabetic chronic kidney disease: Secondary | ICD-10-CM | POA: Diagnosis not present

## 2021-05-27 DIAGNOSIS — M11262 Other chondrocalcinosis, left knee: Secondary | ICD-10-CM | POA: Diagnosis not present

## 2021-05-27 DIAGNOSIS — E1122 Type 2 diabetes mellitus with diabetic chronic kidney disease: Secondary | ICD-10-CM | POA: Diagnosis not present

## 2021-05-27 DIAGNOSIS — M1712 Unilateral primary osteoarthritis, left knee: Secondary | ICD-10-CM | POA: Diagnosis not present

## 2021-05-27 DIAGNOSIS — M129 Arthropathy, unspecified: Secondary | ICD-10-CM | POA: Diagnosis not present

## 2021-05-27 DIAGNOSIS — M76892 Other specified enthesopathies of left lower limb, excluding foot: Secondary | ICD-10-CM | POA: Diagnosis not present

## 2021-05-27 DIAGNOSIS — N1831 Chronic kidney disease, stage 3a: Secondary | ICD-10-CM | POA: Diagnosis not present

## 2021-05-27 DIAGNOSIS — M19012 Primary osteoarthritis, left shoulder: Secondary | ICD-10-CM | POA: Diagnosis not present

## 2021-05-27 DIAGNOSIS — M19011 Primary osteoarthritis, right shoulder: Secondary | ICD-10-CM | POA: Diagnosis not present

## 2021-05-27 DIAGNOSIS — M47812 Spondylosis without myelopathy or radiculopathy, cervical region: Secondary | ICD-10-CM | POA: Diagnosis not present

## 2021-06-01 DIAGNOSIS — M19012 Primary osteoarthritis, left shoulder: Secondary | ICD-10-CM | POA: Diagnosis not present

## 2021-06-01 DIAGNOSIS — M47812 Spondylosis without myelopathy or radiculopathy, cervical region: Secondary | ICD-10-CM | POA: Diagnosis not present

## 2021-06-01 DIAGNOSIS — M1712 Unilateral primary osteoarthritis, left knee: Secondary | ICD-10-CM | POA: Diagnosis not present

## 2021-06-01 DIAGNOSIS — M129 Arthropathy, unspecified: Secondary | ICD-10-CM | POA: Diagnosis not present

## 2021-06-01 DIAGNOSIS — N1831 Chronic kidney disease, stage 3a: Secondary | ICD-10-CM | POA: Diagnosis not present

## 2021-06-01 DIAGNOSIS — M19011 Primary osteoarthritis, right shoulder: Secondary | ICD-10-CM | POA: Diagnosis not present

## 2021-06-01 DIAGNOSIS — E1122 Type 2 diabetes mellitus with diabetic chronic kidney disease: Secondary | ICD-10-CM | POA: Diagnosis not present

## 2021-06-01 DIAGNOSIS — M76892 Other specified enthesopathies of left lower limb, excluding foot: Secondary | ICD-10-CM | POA: Diagnosis not present

## 2021-06-01 DIAGNOSIS — M11262 Other chondrocalcinosis, left knee: Secondary | ICD-10-CM | POA: Diagnosis not present

## 2021-06-03 DIAGNOSIS — M1712 Unilateral primary osteoarthritis, left knee: Secondary | ICD-10-CM | POA: Diagnosis not present

## 2021-06-03 DIAGNOSIS — M76892 Other specified enthesopathies of left lower limb, excluding foot: Secondary | ICD-10-CM | POA: Diagnosis not present

## 2021-06-03 DIAGNOSIS — E1122 Type 2 diabetes mellitus with diabetic chronic kidney disease: Secondary | ICD-10-CM | POA: Diagnosis not present

## 2021-06-03 DIAGNOSIS — M19012 Primary osteoarthritis, left shoulder: Secondary | ICD-10-CM | POA: Diagnosis not present

## 2021-06-03 DIAGNOSIS — M47812 Spondylosis without myelopathy or radiculopathy, cervical region: Secondary | ICD-10-CM | POA: Diagnosis not present

## 2021-06-03 DIAGNOSIS — M19011 Primary osteoarthritis, right shoulder: Secondary | ICD-10-CM | POA: Diagnosis not present

## 2021-06-03 DIAGNOSIS — M11262 Other chondrocalcinosis, left knee: Secondary | ICD-10-CM | POA: Diagnosis not present

## 2021-06-03 DIAGNOSIS — N1831 Chronic kidney disease, stage 3a: Secondary | ICD-10-CM | POA: Diagnosis not present

## 2021-06-03 DIAGNOSIS — M129 Arthropathy, unspecified: Secondary | ICD-10-CM | POA: Diagnosis not present

## 2021-06-08 DIAGNOSIS — M76892 Other specified enthesopathies of left lower limb, excluding foot: Secondary | ICD-10-CM | POA: Diagnosis not present

## 2021-06-08 DIAGNOSIS — M11262 Other chondrocalcinosis, left knee: Secondary | ICD-10-CM | POA: Diagnosis not present

## 2021-06-08 DIAGNOSIS — M129 Arthropathy, unspecified: Secondary | ICD-10-CM | POA: Diagnosis not present

## 2021-06-08 DIAGNOSIS — M19011 Primary osteoarthritis, right shoulder: Secondary | ICD-10-CM | POA: Diagnosis not present

## 2021-06-08 DIAGNOSIS — M19012 Primary osteoarthritis, left shoulder: Secondary | ICD-10-CM | POA: Diagnosis not present

## 2021-06-08 DIAGNOSIS — M47812 Spondylosis without myelopathy or radiculopathy, cervical region: Secondary | ICD-10-CM | POA: Diagnosis not present

## 2021-06-08 DIAGNOSIS — E1122 Type 2 diabetes mellitus with diabetic chronic kidney disease: Secondary | ICD-10-CM | POA: Diagnosis not present

## 2021-06-08 DIAGNOSIS — M1712 Unilateral primary osteoarthritis, left knee: Secondary | ICD-10-CM | POA: Diagnosis not present

## 2021-06-08 DIAGNOSIS — N1831 Chronic kidney disease, stage 3a: Secondary | ICD-10-CM | POA: Diagnosis not present

## 2021-07-12 DIAGNOSIS — R208 Other disturbances of skin sensation: Secondary | ICD-10-CM | POA: Diagnosis not present

## 2021-07-12 DIAGNOSIS — B078 Other viral warts: Secondary | ICD-10-CM | POA: Diagnosis not present

## 2021-07-12 DIAGNOSIS — C44629 Squamous cell carcinoma of skin of left upper limb, including shoulder: Secondary | ICD-10-CM | POA: Diagnosis not present

## 2021-08-03 DIAGNOSIS — N1831 Chronic kidney disease, stage 3a: Secondary | ICD-10-CM | POA: Diagnosis not present

## 2021-08-03 DIAGNOSIS — I129 Hypertensive chronic kidney disease with stage 1 through stage 4 chronic kidney disease, or unspecified chronic kidney disease: Secondary | ICD-10-CM | POA: Diagnosis not present

## 2021-08-03 DIAGNOSIS — E1129 Type 2 diabetes mellitus with other diabetic kidney complication: Secondary | ICD-10-CM | POA: Diagnosis not present

## 2021-08-03 DIAGNOSIS — K760 Fatty (change of) liver, not elsewhere classified: Secondary | ICD-10-CM | POA: Diagnosis not present

## 2021-08-17 DIAGNOSIS — E785 Hyperlipidemia, unspecified: Secondary | ICD-10-CM | POA: Diagnosis not present

## 2021-08-17 DIAGNOSIS — E559 Vitamin D deficiency, unspecified: Secondary | ICD-10-CM | POA: Diagnosis not present

## 2021-08-17 DIAGNOSIS — E538 Deficiency of other specified B group vitamins: Secondary | ICD-10-CM | POA: Diagnosis not present

## 2021-08-17 DIAGNOSIS — E1129 Type 2 diabetes mellitus with other diabetic kidney complication: Secondary | ICD-10-CM | POA: Diagnosis not present

## 2021-08-24 DIAGNOSIS — Z0000 Encounter for general adult medical examination without abnormal findings: Secondary | ICD-10-CM | POA: Diagnosis not present

## 2021-08-24 DIAGNOSIS — R82998 Other abnormal findings in urine: Secondary | ICD-10-CM | POA: Diagnosis not present

## 2021-08-24 DIAGNOSIS — Z1331 Encounter for screening for depression: Secondary | ICD-10-CM | POA: Diagnosis not present

## 2021-08-24 DIAGNOSIS — E1129 Type 2 diabetes mellitus with other diabetic kidney complication: Secondary | ICD-10-CM | POA: Diagnosis not present

## 2021-08-24 DIAGNOSIS — E559 Vitamin D deficiency, unspecified: Secondary | ICD-10-CM | POA: Diagnosis not present

## 2021-08-24 DIAGNOSIS — I10 Essential (primary) hypertension: Secondary | ICD-10-CM | POA: Diagnosis not present

## 2021-08-24 DIAGNOSIS — I129 Hypertensive chronic kidney disease with stage 1 through stage 4 chronic kidney disease, or unspecified chronic kidney disease: Secondary | ICD-10-CM | POA: Diagnosis not present

## 2021-08-24 DIAGNOSIS — K5090 Crohn's disease, unspecified, without complications: Secondary | ICD-10-CM | POA: Diagnosis not present

## 2021-08-24 DIAGNOSIS — E785 Hyperlipidemia, unspecified: Secondary | ICD-10-CM | POA: Diagnosis not present

## 2021-08-26 DIAGNOSIS — N183 Chronic kidney disease, stage 3 unspecified: Secondary | ICD-10-CM | POA: Diagnosis not present

## 2021-08-26 DIAGNOSIS — K7581 Nonalcoholic steatohepatitis (NASH): Secondary | ICD-10-CM | POA: Diagnosis not present

## 2021-08-26 DIAGNOSIS — K5090 Crohn's disease, unspecified, without complications: Secondary | ICD-10-CM | POA: Diagnosis not present

## 2021-08-26 DIAGNOSIS — I129 Hypertensive chronic kidney disease with stage 1 through stage 4 chronic kidney disease, or unspecified chronic kidney disease: Secondary | ICD-10-CM | POA: Diagnosis not present

## 2021-08-26 DIAGNOSIS — E872 Acidosis: Secondary | ICD-10-CM | POA: Diagnosis not present

## 2021-09-03 ENCOUNTER — Ambulatory Visit
Admission: RE | Admit: 2021-09-03 | Discharge: 2021-09-03 | Disposition: A | Discharge status: Home / Self Care | Payer: Medicare PPO | Source: Ambulatory Visit | Attending: Obstetrics and Gynecology | Admitting: Obstetrics and Gynecology

## 2021-09-03 ENCOUNTER — Other Ambulatory Visit: Payer: Self-pay

## 2021-09-03 DIAGNOSIS — R928 Other abnormal and inconclusive findings on diagnostic imaging of breast: Secondary | ICD-10-CM

## 2021-09-03 DIAGNOSIS — R922 Inconclusive mammogram: Secondary | ICD-10-CM | POA: Diagnosis not present

## 2021-11-09 DIAGNOSIS — L298 Other pruritus: Secondary | ICD-10-CM | POA: Diagnosis not present

## 2021-11-09 DIAGNOSIS — D235 Other benign neoplasm of skin of trunk: Secondary | ICD-10-CM | POA: Diagnosis not present

## 2021-11-09 DIAGNOSIS — E785 Hyperlipidemia, unspecified: Secondary | ICD-10-CM | POA: Diagnosis not present

## 2021-11-09 DIAGNOSIS — L853 Xerosis cutis: Secondary | ICD-10-CM | POA: Diagnosis not present

## 2021-11-09 DIAGNOSIS — C44529 Squamous cell carcinoma of skin of other part of trunk: Secondary | ICD-10-CM | POA: Diagnosis not present

## 2021-11-09 DIAGNOSIS — I129 Hypertensive chronic kidney disease with stage 1 through stage 4 chronic kidney disease, or unspecified chronic kidney disease: Secondary | ICD-10-CM | POA: Diagnosis not present

## 2021-11-09 DIAGNOSIS — L570 Actinic keratosis: Secondary | ICD-10-CM | POA: Diagnosis not present

## 2021-11-09 DIAGNOSIS — L728 Other follicular cysts of the skin and subcutaneous tissue: Secondary | ICD-10-CM | POA: Diagnosis not present

## 2021-11-09 DIAGNOSIS — N1831 Chronic kidney disease, stage 3a: Secondary | ICD-10-CM | POA: Diagnosis not present

## 2021-11-09 DIAGNOSIS — E1129 Type 2 diabetes mellitus with other diabetic kidney complication: Secondary | ICD-10-CM | POA: Diagnosis not present

## 2021-11-09 DIAGNOSIS — Z872 Personal history of diseases of the skin and subcutaneous tissue: Secondary | ICD-10-CM | POA: Diagnosis not present

## 2021-11-09 DIAGNOSIS — Z7189 Other specified counseling: Secondary | ICD-10-CM | POA: Diagnosis not present

## 2021-11-09 DIAGNOSIS — K760 Fatty (change of) liver, not elsewhere classified: Secondary | ICD-10-CM | POA: Diagnosis not present

## 2021-11-09 DIAGNOSIS — C44519 Basal cell carcinoma of skin of other part of trunk: Secondary | ICD-10-CM | POA: Diagnosis not present

## 2021-11-09 DIAGNOSIS — Z85828 Personal history of other malignant neoplasm of skin: Secondary | ICD-10-CM | POA: Diagnosis not present

## 2022-02-15 DIAGNOSIS — K760 Fatty (change of) liver, not elsewhere classified: Secondary | ICD-10-CM | POA: Diagnosis not present

## 2022-02-15 DIAGNOSIS — E785 Hyperlipidemia, unspecified: Secondary | ICD-10-CM | POA: Diagnosis not present

## 2022-02-15 DIAGNOSIS — E1129 Type 2 diabetes mellitus with other diabetic kidney complication: Secondary | ICD-10-CM | POA: Diagnosis not present

## 2022-02-15 DIAGNOSIS — I129 Hypertensive chronic kidney disease with stage 1 through stage 4 chronic kidney disease, or unspecified chronic kidney disease: Secondary | ICD-10-CM | POA: Diagnosis not present

## 2022-02-15 DIAGNOSIS — N1831 Chronic kidney disease, stage 3a: Secondary | ICD-10-CM | POA: Diagnosis not present

## 2022-02-21 DIAGNOSIS — E785 Hyperlipidemia, unspecified: Secondary | ICD-10-CM | POA: Diagnosis not present

## 2022-02-21 DIAGNOSIS — M542 Cervicalgia: Secondary | ICD-10-CM | POA: Diagnosis not present

## 2022-02-21 DIAGNOSIS — K760 Fatty (change of) liver, not elsewhere classified: Secondary | ICD-10-CM | POA: Diagnosis not present

## 2022-02-21 DIAGNOSIS — I129 Hypertensive chronic kidney disease with stage 1 through stage 4 chronic kidney disease, or unspecified chronic kidney disease: Secondary | ICD-10-CM | POA: Diagnosis not present

## 2022-02-21 DIAGNOSIS — I700 Atherosclerosis of aorta: Secondary | ICD-10-CM | POA: Diagnosis not present

## 2022-02-21 DIAGNOSIS — E1129 Type 2 diabetes mellitus with other diabetic kidney complication: Secondary | ICD-10-CM | POA: Diagnosis not present

## 2022-02-21 DIAGNOSIS — M5416 Radiculopathy, lumbar region: Secondary | ICD-10-CM | POA: Diagnosis not present

## 2022-02-21 DIAGNOSIS — K5090 Crohn's disease, unspecified, without complications: Secondary | ICD-10-CM | POA: Diagnosis not present

## 2022-02-21 DIAGNOSIS — N1832 Chronic kidney disease, stage 3b: Secondary | ICD-10-CM | POA: Diagnosis not present

## 2022-02-24 ENCOUNTER — Other Ambulatory Visit: Payer: Self-pay | Admitting: Internal Medicine

## 2022-03-01 DIAGNOSIS — M542 Cervicalgia: Secondary | ICD-10-CM | POA: Diagnosis not present

## 2022-03-01 DIAGNOSIS — M5416 Radiculopathy, lumbar region: Secondary | ICD-10-CM | POA: Diagnosis not present

## 2022-03-01 DIAGNOSIS — Z23 Encounter for immunization: Secondary | ICD-10-CM | POA: Diagnosis not present

## 2022-03-15 ENCOUNTER — Other Ambulatory Visit: Payer: Self-pay | Admitting: Internal Medicine

## 2022-03-15 DIAGNOSIS — M542 Cervicalgia: Secondary | ICD-10-CM

## 2022-03-17 ENCOUNTER — Ambulatory Visit
Admission: RE | Admit: 2022-03-17 | Discharge: 2022-03-17 | Disposition: A | Discharge status: Home / Self Care | Payer: Medicare PPO | Source: Ambulatory Visit | Attending: Internal Medicine | Admitting: Internal Medicine

## 2022-03-17 ENCOUNTER — Other Ambulatory Visit: Payer: Medicare PPO

## 2022-03-17 DIAGNOSIS — H04123 Dry eye syndrome of bilateral lacrimal glands: Secondary | ICD-10-CM | POA: Diagnosis not present

## 2022-03-17 DIAGNOSIS — M48061 Spinal stenosis, lumbar region without neurogenic claudication: Secondary | ICD-10-CM | POA: Diagnosis not present

## 2022-03-17 DIAGNOSIS — M542 Cervicalgia: Secondary | ICD-10-CM

## 2022-03-17 DIAGNOSIS — H353132 Nonexudative age-related macular degeneration, bilateral, intermediate dry stage: Secondary | ICD-10-CM | POA: Diagnosis not present

## 2022-03-17 DIAGNOSIS — H2513 Age-related nuclear cataract, bilateral: Secondary | ICD-10-CM | POA: Diagnosis not present

## 2022-03-17 DIAGNOSIS — H52203 Unspecified astigmatism, bilateral: Secondary | ICD-10-CM | POA: Diagnosis not present

## 2022-03-17 DIAGNOSIS — M2578 Osteophyte, vertebrae: Secondary | ICD-10-CM | POA: Diagnosis not present

## 2022-03-17 DIAGNOSIS — M4802 Spinal stenosis, cervical region: Secondary | ICD-10-CM | POA: Diagnosis not present

## 2022-03-17 DIAGNOSIS — M4807 Spinal stenosis, lumbosacral region: Secondary | ICD-10-CM | POA: Diagnosis not present

## 2022-03-17 DIAGNOSIS — R200 Anesthesia of skin: Secondary | ICD-10-CM | POA: Diagnosis not present

## 2022-03-17 DIAGNOSIS — R29898 Other symptoms and signs involving the musculoskeletal system: Secondary | ICD-10-CM | POA: Diagnosis not present

## 2022-03-17 DIAGNOSIS — M545 Low back pain, unspecified: Secondary | ICD-10-CM | POA: Diagnosis not present

## 2022-03-17 DIAGNOSIS — M4316 Spondylolisthesis, lumbar region: Secondary | ICD-10-CM | POA: Diagnosis not present

## 2022-03-22 DIAGNOSIS — G959 Disease of spinal cord, unspecified: Secondary | ICD-10-CM | POA: Diagnosis not present

## 2022-03-24 ENCOUNTER — Other Ambulatory Visit: Payer: Self-pay | Admitting: Neurosurgery

## 2022-03-29 NOTE — Pre-Procedure Instructions (Addendum)
Surgical Instructions ? ? ? Your procedure is scheduled on Friday 04/01/22. ? ? Report to [facility] Main Entrance "A" at 05:30 A.M., then check in with the Admitting office. ? Call this number if you have problems the morning of surgery: ? [phone] ? ? If you have any questions prior to your surgery date call [phone]: Open Monday-Friday 8am-4pm ? ? ? Remember: ? Do not eat or drink after midnight the night before your surgery ?  ? Take these medicines the morning of surgery with A SIP OF WATER:  ? amLODipine (NORVASC)  ? metoprolol succinate (TOPROL-XL) ? venlafaxine [facility])  ? ? Take these medicines if needed:  ? omeprazole (PRILOSEC) ? triamcinolone (NASACORT)  ? ? ?As of today, STOP taking any Aspirin (unless otherwise instructed by your surgeon) Aleve, Naproxen, Ibuprofen, Motrin, Advil, Goody's, BC's, all herbal medications, fish oil, and all vitamins. ? ?WHAT DO I DO ABOUT MY DIABETES MEDICATION? ? ? ?Do not take oral diabetes medicines (pills) the morning of surgery. ? ?DO NO TAKE pioglitazone (ACTOS) the morning of surgery.  ? ?The day of surgery, do not take other diabetes injectables, including Byetta (exenatide), Bydureon (exenatide ER), Victoza (liraglutide), or Trulicity (dulaglutide). ? ? ?HOW TO MANAGE YOUR DIABETES ?BEFORE AND AFTER SURGERY ? ?Why is it important to control my blood sugar before and after surgery? ?Improving blood sugar levels before and after surgery helps healing and can limit problems. ?A way of improving blood sugar control is eating a healthy diet by: ? Eating less sugar and carbohydrates ? Increasing activity/exercise ? Talking with your doctor about reaching your blood sugar goals ?High blood sugars (greater than 180 mg/dL) can raise your risk of infections and slow your recovery, so you will need to focus on controlling your diabetes during the weeks before surgery. ?Make sure that the doctor who takes care of your diabetes knows about your planned surgery  including the date and location. ? ?How do I manage my blood sugar before surgery? ?Check your blood sugar at least 4 times a day, starting 2 days before surgery, to make sure that the level is not too high or low. ? ?Check your blood sugar the morning of your surgery when you wake up and every 2 hours until you get to the Short Stay unit. ? ?If your blood sugar is less than 70 mg/dL, you will need to treat for low blood sugar: ?Do not take insulin. ?Treat a low blood sugar (less than 70 mg/dL) with ? cup of clear juice (cranberry or apple), 4 glucose tablets, OR glucose gel. ?Recheck blood sugar in 15 minutes after treatment (to make sure it is greater than 70 mg/dL). If your blood sugar is not greater than 70 mg/dL on recheck, call [phone] for further instructions. ?Report your blood sugar to the short stay nurse when you get to Short Stay. ? ?If you are admitted to the hospital after surgery: ?Your blood sugar will be checked by the staff and you will probably be given insulin after surgery (instead of oral diabetes medicines) to make sure you have good blood sugar levels. ?The goal for blood sugar control after surgery is 80-180 mg/dL.  ?Do not wear jewelry or makeup ?Do not wear lotions, powders, perfumes/colognes, or deodorant. ?Do not shave 48 hours prior to surgery.  Men may shave face and neck. ?Do not bring valuables to the hospital. ?Do not wear nail polish, gel polish, artificial nails, or any other type of covering on natural nails (  fingers and toes) ?If you have artificial nails or gel coating that need to be removed by a nail salon, please have this removed prior to surgery. Artificial nails or gel coating may interfere with anesthesia's ability to adequately monitor your vital signs. ? ?[facility] is not responsible for any belongings or valuables. .  ? ?Do NOT Smoke (Tobacco/Vaping)  24 hours prior to your procedure ? ?If you use a CPAP at night, you may bring your mask for your overnight  stay. ?  ?Contacts, glasses, hearing aids, dentures or partials may not be worn into surgery, please bring cases for these belongings ?  ?For patients admitted to the hospital, discharge time will be determined by your treatment team. ?  ?Patients discharged the day of surgery will not be allowed to drive home, and someone needs to stay with them for 24 hours. ? ? ?SURGICAL WAITING ROOM VISITATION ?Patients having surgery or a procedure in a hospital may have two support people. ?Children under the age of 37 must have an adult with them who is not the patient. ?They may stay in the waiting area during the procedure and may switch out with other visitors. If the patient needs to stay at the hospital during part of their recovery, the visitor guidelines for inpatient rooms apply. ? ?Please refer to the [facility] website for the visitor guidelines for Inpatients (after your surgery is over and you are in a regular room).  ? ? ? ? ? ?Special instructions:   ? ?Oral Hygiene is also important to reduce your risk of infection.  Remember - BRUSH YOUR TEETH THE MORNING OF SURGERY WITH YOUR REGULAR TOOTHPASTE ? ? ?[facility]- Preparing For Surgery ? ?Before surgery, you can play an important role. Because skin is not sterile, your skin needs to be as free of germs as possible. You can reduce the number of germs on your skin by washing with CHG (chlorahexidine gluconate) Soap before surgery.  CHG is an antiseptic cleaner which kills germs and bonds with the skin to continue killing germs even after washing.   ? ? ?Please do not use if you have an allergy to CHG or antibacterial soaps. If your skin becomes reddened/irritated stop using the CHG.  ?Do not shave (including legs and underarms) for at least 48 hours prior to first CHG shower. It is OK to shave your face. ? ?Please follow these instructions carefully. ?  ? ? Shower the NIGHT BEFORE SURGERY and the MORNING OF SURGERY with CHG Soap.  ? If you chose to wash your hair,  wash your hair first as usual with your normal shampoo. After you shampoo, rinse your hair and body thoroughly to remove the shampoo.  Then [employer] and genitals (private parts) with your normal soap and rinse thoroughly to remove soap. ? ?After that Use CHG Soap as you would any other liquid soap. You can apply CHG directly to the skin and wash gently with a scrungie or a clean washcloth.  ? ?Apply the CHG Soap to your body ONLY FROM THE NECK DOWN.  Do not use on open wounds or open sores. Avoid contact with your eyes, ears, mouth and genitals (private parts). Wash Face and genitals (private parts)  with your normal soap.  ? ?Wash thoroughly, paying special attention to the area where your surgery will be performed. ? ?Thoroughly rinse your body with warm water from the neck down. ? ?DO NOT shower/wash with your normal soap after using and rinsing  off the CHG Soap. ? ?Pat yourself dry with a CLEAN TOWEL. ? ?Wear CLEAN PAJAMAS to bed the night before surgery ? ?Place CLEAN SHEETS on your bed the night before your surgery ? ?DO NOT SLEEP WITH PETS. ? ? ?Day of Surgery: ? ?Take a shower with CHG soap. ?Wear Clean/Comfortable clothing the morning of surgery ?Do not apply any deodorants/lotions.   ?Remember to brush your teeth WITH YOUR REGULAR TOOTHPASTE. ? ? ? ?If you received a COVID test during your pre-op visit  it is requested that you wear a mask when out in public, stay away from anyone that may not be feeling well and notify your surgeon if you develop symptoms. If you have been in contact with anyone that has tested positive in the last 10 days please notify you surgeon. ? ?  ?Please read over the following fact sheets that you were given.  ? ?

## 2022-03-30 ENCOUNTER — Encounter (HOSPITAL_COMMUNITY)
Admission: RE | Admit: 2022-03-30 | Discharge: 2022-03-30 | Disposition: A | Discharge status: Home / Self Care | Payer: Medicare PPO | Source: Ambulatory Visit | Attending: Neurosurgery | Admitting: Neurosurgery

## 2022-03-30 ENCOUNTER — Encounter (HOSPITAL_COMMUNITY): Payer: Self-pay

## 2022-03-30 ENCOUNTER — Other Ambulatory Visit: Payer: Self-pay

## 2022-03-30 VITALS — BP 156/76 | HR 62 | Temp 97.7°F | Resp 16 | Ht 59.0 in | Wt 121.4 lb

## 2022-03-30 DIAGNOSIS — M4322 Fusion of spine, cervical region: Secondary | ICD-10-CM | POA: Diagnosis not present

## 2022-03-30 DIAGNOSIS — E785 Hyperlipidemia, unspecified: Secondary | ICD-10-CM | POA: Diagnosis present

## 2022-03-30 DIAGNOSIS — M452 Ankylosing spondylitis of cervical region: Secondary | ICD-10-CM | POA: Diagnosis not present

## 2022-03-30 DIAGNOSIS — F32A Depression, unspecified: Secondary | ICD-10-CM | POA: Diagnosis present

## 2022-03-30 DIAGNOSIS — Z886 Allergy status to analgesic agent status: Secondary | ICD-10-CM | POA: Diagnosis not present

## 2022-03-30 DIAGNOSIS — Z880 Allergy status to penicillin: Secondary | ICD-10-CM | POA: Diagnosis not present

## 2022-03-30 DIAGNOSIS — G959 Disease of spinal cord, unspecified: Secondary | ICD-10-CM | POA: Diagnosis not present

## 2022-03-30 DIAGNOSIS — Z981 Arthrodesis status: Secondary | ICD-10-CM | POA: Diagnosis not present

## 2022-03-30 DIAGNOSIS — Z79899 Other long term (current) drug therapy: Secondary | ICD-10-CM | POA: Diagnosis not present

## 2022-03-30 DIAGNOSIS — E119 Type 2 diabetes mellitus without complications: Secondary | ICD-10-CM | POA: Diagnosis present

## 2022-03-30 DIAGNOSIS — D649 Anemia, unspecified: Secondary | ICD-10-CM | POA: Diagnosis not present

## 2022-03-30 DIAGNOSIS — G9520 Unspecified cord compression: Secondary | ICD-10-CM | POA: Diagnosis present

## 2022-03-30 DIAGNOSIS — Z85820 Personal history of malignant melanoma of skin: Secondary | ICD-10-CM | POA: Diagnosis not present

## 2022-03-30 DIAGNOSIS — Z01812 Encounter for preprocedural laboratory examination: Secondary | ICD-10-CM | POA: Insufficient documentation

## 2022-03-30 DIAGNOSIS — Z8249 Family history of ischemic heart disease and other diseases of the circulatory system: Secondary | ICD-10-CM | POA: Diagnosis not present

## 2022-03-30 DIAGNOSIS — Z01818 Encounter for other preprocedural examination: Secondary | ICD-10-CM

## 2022-03-30 DIAGNOSIS — Z888 Allergy status to other drugs, medicaments and biological substances status: Secondary | ICD-10-CM | POA: Diagnosis not present

## 2022-03-30 DIAGNOSIS — Z833 Family history of diabetes mellitus: Secondary | ICD-10-CM | POA: Diagnosis not present

## 2022-03-30 DIAGNOSIS — M4802 Spinal stenosis, cervical region: Secondary | ICD-10-CM | POA: Diagnosis present

## 2022-03-30 DIAGNOSIS — I10 Essential (primary) hypertension: Secondary | ICD-10-CM | POA: Diagnosis present

## 2022-03-30 DIAGNOSIS — Z9049 Acquired absence of other specified parts of digestive tract: Secondary | ICD-10-CM | POA: Diagnosis not present

## 2022-03-30 DIAGNOSIS — M4712 Other spondylosis with myelopathy, cervical region: Secondary | ICD-10-CM | POA: Diagnosis present

## 2022-03-30 DIAGNOSIS — Z8673 Personal history of transient ischemic attack (TIA), and cerebral infarction without residual deficits: Secondary | ICD-10-CM | POA: Diagnosis not present

## 2022-03-30 DIAGNOSIS — K219 Gastro-esophageal reflux disease without esophagitis: Secondary | ICD-10-CM | POA: Diagnosis present

## 2022-03-30 HISTORY — DX: Anemia, unspecified: D64.9

## 2022-03-30 HISTORY — DX: Gastro-esophageal reflux disease without esophagitis: K21.9

## 2022-03-30 HISTORY — DX: Personal history of urinary calculi: Z87.442

## 2022-03-30 LAB — CBC
HCT: 39.9 % (ref 36.0–46.0)
Hemoglobin: 12.2 g/dL (ref 12.0–15.0)
MCH: 29 pg (ref 26.0–34.0)
MCHC: 30.6 g/dL (ref 30.0–36.0)
MCV: 94.8 fL (ref 80.0–100.0)
Platelets: 192 10*3/uL (ref 150–400)
RBC: 4.21 MIL/uL (ref 3.87–5.11)
RDW: 14.6 % (ref 11.5–15.5)
WBC: 13.6 10*3/uL — ABNORMAL HIGH (ref 4.0–10.5)
nRBC: 0 % (ref 0.0–0.2)

## 2022-03-30 LAB — BASIC METABOLIC PANEL
Anion gap: 6 (ref 5–15)
BUN: 22 mg/dL (ref 8–23)
CO2: 26 mmol/L (ref 22–32)
Calcium: 8.8 mg/dL — ABNORMAL LOW (ref 8.9–10.3)
Chloride: 109 mmol/L (ref 98–111)
Creatinine, Ser: 1.19 mg/dL — ABNORMAL HIGH (ref 0.44–1.00)
GFR, Estimated: 48 mL/min — ABNORMAL LOW (ref >60)
Glucose, Bld: 121 mg/dL — ABNORMAL HIGH (ref 70–99)
Potassium: 4.1 mmol/L (ref 3.5–5.1)
Sodium: 141 mmol/L (ref 135–145)

## 2022-03-30 LAB — SURGICAL PCR SCREEN
MRSA, PCR: NEGATIVE
Staphylococcus aureus: NEGATIVE

## 2022-03-30 LAB — GLUCOSE, CAPILLARY: Glucose-Capillary: 106 mg/dL — ABNORMAL HIGH (ref 70–99)

## 2022-03-30 LAB — HEMOGLOBIN A1C
Hgb A1c MFr Bld: 8.1 % — ABNORMAL HIGH (ref 4.8–5.6)
Mean Plasma Glucose: 185.77 mg/dL

## 2022-03-30 NOTE — Progress Notes (Addendum)
PCP - Dr. Bevelyn Buckles ?Cardiologist - pt. denies ? ?PPM/ICD - n/a ? ? ?Chest x-ray - 05/12/21 ?EKG - 05/07/21 ?Stress Test - Over 20 years ago. Normal per patient.  ?ECHO - pt. denies ?Cardiac Cath - pt. denies ? ?Sleep Study - denies ?CPAP - n/a ? ?Fasting Blood Sugar - 106 ?Checks Blood Sugar- never per patient ?A1 C drawn today- 8.1 ? ?Blood Thinner Instructions: n/a ?Aspirin Instructions: n/a ? ?COVID TEST- n/a ? ? ?Anesthesia review: Yes per anesthesia review values.  Hemoglobin A1 C greater than 8.0 ? ?Patient denies shortness of breath, fever, cough and chest pain at PAT appointment ? ? ?All instructions explained to the patient, with a verbal understanding of the material. Patient agrees to go over the instructions while at home for a better understanding. Patient also instructed to self quarantine after being tested for COVID-19. The opportunity to ask questions was provided. ? ? ?

## 2022-03-31 NOTE — Progress Notes (Signed)
Anesthesia Chart Review: ? Case: [id number] Date/Time: 04/01/22 0715  ? Procedure: ACDF - C3-C4 with removal of [employer] hardware left-sided approach (Left)  ? Anesthesia type: General  ? Pre-op diagnosis: Myelopathy  ? Location: [facility] ROOM 20 / [facility] OR  ? Surgeons: Kary Kos, MD  ? ?  ? ? ?DISCUSSION: Patient is a 71 year old female scheduled for the above procedure. ? ?History includes never smoker, HTN, Crohn's disease (bowel resection 1971, 1985, 1996), melanoma (on back, s/p excision), HLD, DM2, TIA 04/30/2020, anemia, GERD, DES exposure in utero, spinal surgery (C4-7 ACDF 05/14/09), cholecystectomy (with LOA, core liver biopsy, gastric repair 08/10/16), snoring. ? ?She denies shortness of breath, cough, fever, chest pain at PAT RN visit.  PAT labs were routed to Dr. Saintclair Halsted for review.  She will get a CBG on arrival.  Anesthesia team to evaluate on the day of surgery. ? ? ?VS: BP (!) 156/76   Pulse 62   Temp 36.5 ?C (Oral)   Resp 16   Ht 4' 11"  (1.499 m)   Wt 55.1 kg   SpO2 100%   BMI 24.52 kg/m?  ? ? ?PROVIDERS: ?Donnajean Lopes, MD is PCP  ?Buccini, Herbie [place], MD is GI ? ?LABS: Preoperative labs noted. A1c 8.1.  She does not do home CBG monitoring.  PAT lab results routed to Dr. Saintclair Halsted for review.  ?(all labs ordered are listed, but only abnormal results are displayed) ? ?Labs Reviewed  ?GLUCOSE, CAPILLARY - Abnormal; Notable for the following components:  ?    Result Value  ? Glucose-Capillary 106 (*)   ? All other components within normal limits  ?HEMOGLOBIN A1C - Abnormal; Notable for the following components:  ? Hgb A1c MFr Bld 8.1 (*)   ? All other components within normal limits  ?BASIC METABOLIC PANEL - Abnormal; Notable for the following components:  ? Glucose, Bld 121 (*)   ? Creatinine, Ser 1.19 (*)   ? Calcium 8.8 (*)   ? GFR, Estimated 48 (*)   ? All other components within normal limits  ?CBC - Abnormal; Notable for the following components:  ? WBC 13.6 (*)   ? All other components within normal limits   ?SURGICAL PCR SCREEN  ? ? ?IMAGES: ?MRI C-spine 03/17/22: ?IMPRESSION: ?1. Adjacent segment disease at C3-4 including severe facet arthrosis ?with prominent edema. Severe spinal stenosis and severe right neural ?foraminal stenosis with suspected mild edema or myelomalacia in the ?spinal cord at this level. ?2. C4-C7 ACDF without significant residual stenosis. ?3. Mild neural foraminal stenosis at C7-T1 and T1-2. ? ?MRI L-spine 03/17/22 (ordered by Donnajean Lopes, MD) ?IMPRESSION: ?1. Progressive lumbar disc and facet degeneration since 2011, most ?notable at L4-5 where there is new grade 1 anterolisthesis, severe ?spinal stenosis, and mild-to-moderate neural foraminal stenosis. ?2. Moderate left neural foraminal and severe left lateral recess ?stenosis at L5-S1. ?3. Mild lateral recess stenosis at L3-4. ?4. 3.3 cm right adnexal simple-appearing cyst, not adequately ?characterized although slightly smaller than in 2011 and most likely ?benign. Recommend prompt follow-up with pelvic US if this has not ?previously been performed elsewhere. Reference: JACR 2020 ?Feb;17(2):248-254 ?  ?1V CXR 05/12/21: ?FINDINGS: ?The heart size and mediastinal contours are within normal limits. ?Both lungs are clear. Surgical hardware in the cervical spine.  ?IMPRESSION: ?No active disease. ?  ?CT Abd/pelvis 05/03/21: ?IMPRESSION: ?1. Diarrheal state with probable mild colitis. Correlation with ?clinical exam and stool cultures recommended. No bowel obstruction. ?2. Fatty liver. ?3. Aortic Atherosclerosis (ICD10-I70.0). ?  ? ?  EKG: ?EKG 05/03/21: Sinus arrhythmia. Borderline T wave abnormalities, anterior leads. ? ? ?CV: ?US Carotid 05/28/20: ?Summary:  ?- Right Carotid: There is no evidence of stenosis in the right ICA.  ?- Left Carotid: There is no evidence of stenosis in the left ICA.  ?- Vertebrals:  Bilateral vertebral arteries demonstrate antegrade flow.  ?- Subclavians: Normal flow hemodynamics were seen in bilateral subclavian  ?              arteries.  ? ? ?Reported a remote history of a normal stress test over 20 years ago. ? ? ?Past Medical History:  ?Diagnosis Date  ? Anemia   ? Arthritis   ? Cancer [employer])   ? melanoma on back   ? Crohn's disease ([place])   ? Depression   ? DES exposure in utero   ? Diabetes ([place])   ? type 2  ? Forgetfulness   ? GERD (gastroesophageal reflux disease)   ? History of kidney stones   ? Hyperlipidemia   ? Hypertension   ? Kidney stones   ? Menopausal symptoms   ? SBO (small bowel obstruction) ([place]) 06/2011, 11/2018  ? tx w/steroids  ? Snoring   ? TIA (transient ischemic attack) 04/2020  ? ? ?Past Surgical History:  ?Procedure Laterality Date  ? Anal abscess surg    ? anal sphincterectomy    ? APPENDECTOMY  1970  ? [place]  ? small, x 3  ? BREAST BIOPSY Left   ? BREAST SURGERY    ? Biopsy-Benign  ? CERVICAL FUSION  05/2009  ? C4-C7  ? CHOLECYSTECTOMY N/A 08/10/2016  ? Procedure: LAPAROSCOPIC CHOLECYSTECTOMY WITH INTRAOPERATIVE CHOLANGIOGRAM -STANDARD 4 PORT;  Surgeon: [name], MD;  Location: [facility] ORS;  Service: General;  Laterality: N/A;  ? [place]  ? HAND SURGERY    ? ILEOCECETOMY  1971  ? Crohns disease flare  ? KIDNEY SURGERY  1992  ? x2  ? KNEE SURGERY Left 1998  ? LAPAROSCOPIC LYSIS OF ADHESIONS N/A 08/10/2016  ? Procedure: LAPAROSCOPIC LYSIS OF ADHESIONS WITH CORE LIVER BIOPSY;  Surgeon: [name], MD;  Location: [facility] ORS;  Service: General;  Laterality: N/A;  ? LIPOSUCTION  2004  ? neck  ? NASAL SINUS SURGERY  1995  ? PR DRAIN/INJECT LARGE JOINT/BURSA  05/11/2021  ?    ? SKIN CANCER EXCISION  03/2016  ? melanoma, back  ? SMALL INTESTINE SURGERY  1985  ? Crohn stricture  ? SMALL INTESTINE SURGERY  1996  ? Crohn stricture  ? Thumb surg    ? TONGUE BASE REDUCTION SOMNOPLASTY  2000  ? Removed uvula  ? TONSILLECTOMY AND ADENOIDECTOMY  1959  ? [place] EXTRACTION  1974  ? ? ?MEDICATIONS: ? amLODipine (NORVASC) 5 MG tablet  ? cyanocobalamin (,VITAMIN B-12,)  1000 MCG/ML injection  ? fenofibrate 54 MG tablet  ? losartan (COZAAR) 100 MG tablet  ? metoprolol succinate (TOPROL-XL) 100 MG 24 hr tablet  ? Multiple Vitamins-Minerals (PRESERVISION AREDS) CAPS  ? omeprazole (PRILOSEC) 20 MG capsule  ? pioglitazone (ACTOS) 15 MG tablet  ? potassium chloride SA (KLOR-CON) 20 MEQ tablet  ? saccharomyces boulardii (FLORASTOR) 250 MG capsule  ? triamcinolone (NASACORT) 55 MCG/ACT AERO nasal inhaler  ? venlafaxine (EFFEXOR) 75 MG tablet  ? Vitamin E 400 units TABS  ? ?No current facility-administered medications for this encounter.  ? ? ?Myra Gianotti, PA-C ?Surgical Short Stay/Anesthesiology ?[facility] Phone [phone] ?[facility]  Phone [phone] ?03/31/2022 10:39 AM ? ? ? ? ? ? ?

## 2022-03-31 NOTE — Anesthesia Preprocedure Evaluation (Addendum)
Anesthesia Evaluation  ?Patient identified by MRN, date of birth, ID band ?Patient awake ? ? ? ?Reviewed: ?Allergy & Precautions, NPO status , Patient's Chart, lab work & pertinent test results, reviewed documented beta blocker date and time  ? ?Airway ?Mallampati: II ? ?TM Distance: >3 FB ?Neck ROM: Full ? ? ? Dental ? ?(+) Dental Advisory Given, Partial Upper, Partial Lower, Missing, Poor Dentition ?  ?Pulmonary ?neg pulmonary ROS,  ?  ?Pulmonary exam normal ?breath sounds clear to auscultation ? ? ? ? ? ? Cardiovascular ?hypertension, Pt. on medications and Pt. on home beta blockers ?Normal cardiovascular exam ?Rhythm:Regular Rate:Normal ? ? ?  ?Neuro/Psych ?PSYCHIATRIC DISORDERS Depression TIA  ? GI/Hepatic ?GERD  ,(+) Hepatitis -  ?Endo/Other  ?diabetes ? Renal/GU ?Renal disease  ? ?  ?Musculoskeletal ? ?(+) Arthritis ,  ? Abdominal ?  ?Peds ? Hematology ? ?(+) Blood dyscrasia, anemia ,   ?Anesthesia Other Findings ? ? Reproductive/Obstetrics ? ?  ? ? ? ? ? ? ? ? ? ? ? ? ? ?  ?  ? ? ? ? ? ?Anesthesia Physical ?Anesthesia Plan ? ?ASA: 3 ? ?Anesthesia Plan: General  ? ?Post-op Pain Management: Ofirmev IV (intra-op)*  ? ?Induction: Intravenous ? ?PONV Risk Score and Plan: 4 or greater and Ondansetron, Dexamethasone, Midazolam and Treatment may vary due to age or medical condition ? ?Airway Management Planned: Video Laryngoscope Planned and Oral ETT ? ?Additional Equipment:  ? ?Intra-op Plan:  ? ?Post-operative Plan: Extubation in OR ? ?Informed Consent: I have reviewed the patients History and Physical, chart, labs and discussed the procedure including the risks, benefits and alternatives for the proposed anesthesia with the patient or authorized representative who has indicated his/her understanding and acceptance.  ? ? ? ?Dental advisory given ? ?Plan Discussed with: CRNA ? ?Anesthesia Plan Comments: (PAT note written 03/31/2022 by Myra Gianotti, PA-C. ?)  ? ? ? ? ?Anesthesia  Quick Evaluation ? ?

## 2022-04-01 ENCOUNTER — Encounter (HOSPITAL_COMMUNITY): Payer: Self-pay | Admitting: Neurosurgery

## 2022-04-01 ENCOUNTER — Inpatient Hospital Stay (HOSPITAL_COMMUNITY)
Admission: RE | Admit: 2022-04-01 | Discharge: 2022-04-02 | DRG: 472 | Disposition: A | Discharge status: Home / Self Care | Payer: Medicare PPO | Attending: Neurosurgery | Admitting: Neurosurgery

## 2022-04-01 ENCOUNTER — Encounter (HOSPITAL_COMMUNITY)
Admission: RE | Disposition: A | Discharge status: Home / Self Care | Payer: Self-pay | Source: Home / Self Care | Attending: Neurosurgery

## 2022-04-01 ENCOUNTER — Inpatient Hospital Stay (HOSPITAL_COMMUNITY): Payer: Medicare PPO | Admitting: Vascular Surgery

## 2022-04-01 ENCOUNTER — Other Ambulatory Visit: Payer: Self-pay

## 2022-04-01 ENCOUNTER — Inpatient Hospital Stay (HOSPITAL_COMMUNITY): Payer: Medicare PPO | Admitting: Anesthesiology

## 2022-04-01 ENCOUNTER — Inpatient Hospital Stay (HOSPITAL_COMMUNITY): Payer: Medicare PPO

## 2022-04-01 DIAGNOSIS — M4712 Other spondylosis with myelopathy, cervical region: Secondary | ICD-10-CM | POA: Diagnosis not present

## 2022-04-01 DIAGNOSIS — Z8249 Family history of ischemic heart disease and other diseases of the circulatory system: Secondary | ICD-10-CM | POA: Diagnosis not present

## 2022-04-01 DIAGNOSIS — Z85820 Personal history of malignant melanoma of skin: Secondary | ICD-10-CM

## 2022-04-01 DIAGNOSIS — Z8673 Personal history of transient ischemic attack (TIA), and cerebral infarction without residual deficits: Secondary | ICD-10-CM

## 2022-04-01 DIAGNOSIS — Z880 Allergy status to penicillin: Secondary | ICD-10-CM | POA: Diagnosis not present

## 2022-04-01 DIAGNOSIS — E119 Type 2 diabetes mellitus without complications: Secondary | ICD-10-CM | POA: Diagnosis present

## 2022-04-01 DIAGNOSIS — I10 Essential (primary) hypertension: Secondary | ICD-10-CM

## 2022-04-01 DIAGNOSIS — Z79899 Other long term (current) drug therapy: Secondary | ICD-10-CM | POA: Diagnosis not present

## 2022-04-01 DIAGNOSIS — K219 Gastro-esophageal reflux disease without esophagitis: Secondary | ICD-10-CM | POA: Diagnosis present

## 2022-04-01 DIAGNOSIS — M4802 Spinal stenosis, cervical region: Secondary | ICD-10-CM | POA: Diagnosis present

## 2022-04-01 DIAGNOSIS — Z981 Arthrodesis status: Secondary | ICD-10-CM

## 2022-04-01 DIAGNOSIS — F32A Depression, unspecified: Secondary | ICD-10-CM | POA: Diagnosis present

## 2022-04-01 DIAGNOSIS — Z888 Allergy status to other drugs, medicaments and biological substances status: Secondary | ICD-10-CM

## 2022-04-01 DIAGNOSIS — Z833 Family history of diabetes mellitus: Secondary | ICD-10-CM

## 2022-04-01 DIAGNOSIS — G9520 Unspecified cord compression: Secondary | ICD-10-CM | POA: Diagnosis present

## 2022-04-01 DIAGNOSIS — Z9049 Acquired absence of other specified parts of digestive tract: Secondary | ICD-10-CM | POA: Diagnosis not present

## 2022-04-01 DIAGNOSIS — E785 Hyperlipidemia, unspecified: Secondary | ICD-10-CM | POA: Diagnosis present

## 2022-04-01 DIAGNOSIS — Z886 Allergy status to analgesic agent status: Secondary | ICD-10-CM

## 2022-04-01 DIAGNOSIS — G992 Myelopathy in diseases classified elsewhere: Principal | ICD-10-CM | POA: Diagnosis present

## 2022-04-01 HISTORY — PX: ANTERIOR CERVICAL DECOMP/DISCECTOMY FUSION: SHX1161

## 2022-04-01 LAB — GLUCOSE, CAPILLARY
Glucose-Capillary: 199 mg/dL — ABNORMAL HIGH (ref 70–99)
Glucose-Capillary: 139 mg/dL — ABNORMAL HIGH (ref 70–99)
Glucose-Capillary: 170 mg/dL — ABNORMAL HIGH (ref 70–99)
Glucose-Capillary: 225 mg/dL — ABNORMAL HIGH (ref 70–99)
Glucose-Capillary: 196 mg/dL — ABNORMAL HIGH (ref 70–99)

## 2022-04-01 SURGERY — ANTERIOR CERVICAL DECOMPRESSION/DISCECTOMY FUSION 1 LEVEL/HARDWARE REMOVAL
Anesthesia: General | Site: Spine Cervical

## 2022-04-01 MED ORDER — AMLODIPINE BESYLATE 5 MG PO TABS: 5.0000 mg | ORAL_TABLET | Freq: Every day | ORAL | Status: DC

## 2022-04-01 MED ORDER — PIOGLITAZONE HCL 15 MG PO TABS
15.0000 mg | ORAL_TABLET | Freq: Every day | ORAL | Status: DC
  Administered 2022-04-02: 15 mg via ORAL
  Filled 2022-04-01: qty 1

## 2022-04-01 MED ORDER — LOSARTAN POTASSIUM 50 MG PO TABS
100.0000 mg | ORAL_TABLET | Freq: Every day | ORAL | Status: DC
Start: 2022-04-01 — End: 2022-04-02
  Administered 2022-04-01: 100 mg via ORAL
  Filled 2022-04-01: qty 2

## 2022-04-01 MED ORDER — CHLORHEXIDINE GLUCONATE 0.12 % MT SOLN
15.0000 mL | Freq: Once | OROMUCOSAL | Status: AC
  Administered 2022-04-01: 15 mL via OROMUCOSAL
  Filled 2022-04-01: qty 15

## 2022-04-01 MED ORDER — VANCOMYCIN HCL 1000 MG IV SOLR
INTRAVENOUS | Status: DC | PRN
  Administered 2022-04-01: 1000 mg via INTRAVENOUS

## 2022-04-01 MED ORDER — ACETAMINOPHEN 325 MG PO TABS: 650.0000 mg | ORAL_TABLET | ORAL | Status: DC | PRN

## 2022-04-01 MED ORDER — VANCOMYCIN HCL IN DEXTROSE 1-5 GM/200ML-% IV SOLN
INTRAVENOUS | Status: AC
  Filled 2022-04-01: qty 200

## 2022-04-01 MED ORDER — PROPOFOL 10 MG/ML IV BOLUS
INTRAVENOUS | Status: DC | PRN
  Administered 2022-04-01: 80 mg via INTRAVENOUS

## 2022-04-01 MED ORDER — SODIUM CHLORIDE 0.9% FLUSH: 3.0000 mL | Freq: Two times a day (BID) | INTRAVENOUS | Status: DC

## 2022-04-01 MED ORDER — SODIUM CHLORIDE 0.9% FLUSH: 3.0000 mL | INTRAVENOUS | Status: DC | PRN

## 2022-04-01 MED ORDER — SUGAMMADEX SODIUM 200 MG/2ML IV SOLN
INTRAVENOUS | Status: DC | PRN
  Administered 2022-04-01: 150 mg via INTRAVENOUS

## 2022-04-01 MED ORDER — ORAL CARE MOUTH RINSE: 15.0000 mL | Freq: Once | OROMUCOSAL | Status: AC

## 2022-04-01 MED ORDER — LIDOCAINE 2% (20 MG/ML) 5 ML SYRINGE
INTRAMUSCULAR | Status: DC | PRN
  Administered 2022-04-01: 40 mg via INTRAVENOUS

## 2022-04-01 MED ORDER — MIDAZOLAM HCL 2 MG/2ML IJ SOLN
INTRAMUSCULAR | Status: AC
  Filled 2022-04-01: qty 2

## 2022-04-01 MED ORDER — 0.9 % SODIUM CHLORIDE (POUR BTL) OPTIME
TOPICAL | Status: DC | PRN
  Administered 2022-04-01: 1000 mL

## 2022-04-01 MED ORDER — OXYCODONE HCL 5 MG PO TABS: 5.0000 mg | ORAL_TABLET | Freq: Once | ORAL | Status: DC | PRN

## 2022-04-01 MED ORDER — VITAMIN E 180 MG (400 UNIT) PO CAPS
400.0000 [IU] | ORAL_CAPSULE | Freq: Two times a day (BID) | ORAL | Status: DC
  Administered 2022-04-01: 400 [IU] via ORAL
  Filled 2022-04-01 (×2): qty 1

## 2022-04-01 MED ORDER — HYDROMORPHONE HCL 1 MG/ML IJ SOLN: 0.5000 mg | INTRAMUSCULAR | Status: DC | PRN

## 2022-04-01 MED ORDER — ACETAMINOPHEN 10 MG/ML IV SOLN
INTRAVENOUS | Status: AC
  Filled 2022-04-01: qty 100

## 2022-04-01 MED ORDER — ONDANSETRON HCL 4 MG/2ML IJ SOLN: 4.0000 mg | Freq: Four times a day (QID) | INTRAMUSCULAR | Status: DC | PRN

## 2022-04-01 MED ORDER — FENTANYL CITRATE (PF) 250 MCG/5ML IJ SOLN
INTRAMUSCULAR | Status: AC
  Filled 2022-04-01: qty 5

## 2022-04-01 MED ORDER — SODIUM CHLORIDE 0.9 % IV SOLN: 250.0000 mL | INTRAVENOUS | Status: DC

## 2022-04-01 MED ORDER — ACETAMINOPHEN 650 MG RE SUPP: 650.0000 mg | RECTAL | Status: DC | PRN

## 2022-04-01 MED ORDER — PANTOPRAZOLE SODIUM 40 MG IV SOLR: 40.0000 mg | Freq: Every day | INTRAVENOUS | Status: DC

## 2022-04-01 MED ORDER — INSULIN ASPART 100 UNIT/ML IJ SOLN: 0.0000 [IU] | INTRAMUSCULAR | Status: DC | PRN

## 2022-04-01 MED ORDER — ONDANSETRON HCL 4 MG/2ML IJ SOLN
INTRAMUSCULAR | Status: DC | PRN
  Administered 2022-04-01: 4 mg via INTRAVENOUS

## 2022-04-01 MED ORDER — OXYCODONE HCL 5 MG/5ML PO SOLN: 5.0000 mg | Freq: Once | ORAL | Status: DC | PRN

## 2022-04-01 MED ORDER — AMISULPRIDE (ANTIEMETIC) 5 MG/2ML IV SOLN: 10.0000 mg | Freq: Once | INTRAVENOUS | Status: DC | PRN

## 2022-04-01 MED ORDER — PROSIGHT PO TABS
1.0000 | ORAL_TABLET | Freq: Two times a day (BID) | ORAL | Status: DC
  Administered 2022-04-01: 1 via ORAL
  Filled 2022-04-01 (×2): qty 1

## 2022-04-01 MED ORDER — PHENOL 1.4 % MT LIQD: 1.0000 | OROMUCOSAL | Status: DC | PRN

## 2022-04-01 MED ORDER — INSULIN ASPART 100 UNIT/ML IJ SOLN
INTRAMUSCULAR | Status: AC
  Administered 2022-04-01: 2 [IU] via SUBCUTANEOUS
  Filled 2022-04-01: qty 1

## 2022-04-01 MED ORDER — ROCURONIUM BROMIDE 10 MG/ML (PF) SYRINGE
PREFILLED_SYRINGE | INTRAVENOUS | Status: DC | PRN
Start: 2022-04-01 — End: 2022-04-01
  Administered 2022-04-01: 10 mg via INTRAVENOUS
  Administered 2022-04-01: 20 mg via INTRAVENOUS
  Administered 2022-04-01: 40 mg via INTRAVENOUS

## 2022-04-01 MED ORDER — FENOFIBRATE 54 MG PO TABS
54.0000 mg | ORAL_TABLET | ORAL | Status: DC
Start: 2022-04-02 — End: 2022-04-02
  Filled 2022-04-01: qty 1

## 2022-04-01 MED ORDER — THROMBIN 5000 UNITS EX SOLR
CUTANEOUS | Status: DC | PRN
  Administered 2022-04-01 (×2): 5000 [IU] via TOPICAL

## 2022-04-01 MED ORDER — FENTANYL CITRATE (PF) 250 MCG/5ML IJ SOLN
INTRAMUSCULAR | Status: DC | PRN
  Administered 2022-04-01: 100 ug via INTRAVENOUS
  Administered 2022-04-01: 50 ug via INTRAVENOUS

## 2022-04-01 MED ORDER — ALUM & MAG HYDROXIDE-SIMETH 200-200-20 MG/5ML PO SUSP: 30.0000 mL | Freq: Four times a day (QID) | ORAL | Status: DC | PRN

## 2022-04-01 MED ORDER — DEXAMETHASONE SODIUM PHOSPHATE 10 MG/ML IJ SOLN
INTRAMUSCULAR | Status: DC | PRN
Start: 2022-04-01 — End: 2022-04-01
  Administered 2022-04-01: 5 mg via INTRAVENOUS

## 2022-04-01 MED ORDER — TRIAMCINOLONE ACETONIDE 55 MCG/ACT NA AERO: 1.0000 | INHALATION_SPRAY | Freq: Every day | NASAL | Status: DC | PRN

## 2022-04-01 MED ORDER — MENTHOL 3 MG MT LOZG: 1.0000 | LOZENGE | OROMUCOSAL | Status: DC | PRN

## 2022-04-01 MED ORDER — THROMBIN 5000 UNITS EX SOLR: CUTANEOUS | Status: DC | PRN

## 2022-04-01 MED ORDER — THROMBIN 5000 UNITS EX SOLR
CUTANEOUS | Status: AC
  Filled 2022-04-01: qty 15000

## 2022-04-01 MED ORDER — ACETAMINOPHEN 10 MG/ML IV SOLN
INTRAVENOUS | Status: DC | PRN
  Administered 2022-04-01: 1000 mg via INTRAVENOUS

## 2022-04-01 MED ORDER — PHENYLEPHRINE HCL-NACL 20-0.9 MG/250ML-% IV SOLN
INTRAVENOUS | Status: DC | PRN
Start: 2022-04-01 — End: 2022-04-01
  Administered 2022-04-01: 25 ug/min via INTRAVENOUS

## 2022-04-01 MED ORDER — SACCHAROMYCES BOULARDII 250 MG PO CAPS: 250.0000 mg | ORAL_CAPSULE | Freq: Every day | ORAL | Status: DC | PRN

## 2022-04-01 MED ORDER — PROPOFOL 10 MG/ML IV BOLUS
INTRAVENOUS | Status: AC
  Filled 2022-04-01: qty 20

## 2022-04-01 MED ORDER — HYDROCODONE-ACETAMINOPHEN 5-325 MG PO TABS
2.0000 | ORAL_TABLET | ORAL | Status: DC | PRN
  Administered 2022-04-01 – 2022-04-02 (×3): 2 via ORAL
  Filled 2022-04-01 (×3): qty 2

## 2022-04-01 MED ORDER — CYCLOBENZAPRINE HCL 10 MG PO TABS
10.0000 mg | ORAL_TABLET | Freq: Three times a day (TID) | ORAL | Status: DC | PRN
  Administered 2022-04-01: 10 mg via ORAL
  Filled 2022-04-01: qty 1

## 2022-04-01 MED ORDER — LACTATED RINGERS IV SOLN: INTRAVENOUS | Status: DC

## 2022-04-01 MED ORDER — MIDAZOLAM HCL 2 MG/2ML IJ SOLN
INTRAMUSCULAR | Status: DC | PRN
  Administered 2022-04-01: 1 mg via INTRAVENOUS

## 2022-04-01 MED ORDER — PANTOPRAZOLE SODIUM 40 MG PO TBEC: 40.0000 mg | DELAYED_RELEASE_TABLET | Freq: Every day | ORAL | Status: DC

## 2022-04-01 MED ORDER — VENLAFAXINE HCL 75 MG PO TABS
150.0000 mg | ORAL_TABLET | Freq: Two times a day (BID) | ORAL | Status: DC
  Administered 2022-04-01: 150 mg via ORAL
  Filled 2022-04-01 (×2): qty 2

## 2022-04-01 MED ORDER — ONDANSETRON HCL 4 MG PO TABS: 4.0000 mg | ORAL_TABLET | Freq: Four times a day (QID) | ORAL | Status: DC | PRN

## 2022-04-01 MED ORDER — HYDROMORPHONE HCL 1 MG/ML IJ SOLN: 0.2500 mg | INTRAMUSCULAR | Status: DC | PRN

## 2022-04-01 MED ORDER — METOPROLOL SUCCINATE ER 50 MG PO TB24
50.0000 mg | ORAL_TABLET | Freq: Two times a day (BID) | ORAL | Status: DC
  Administered 2022-04-01: 50 mg via ORAL
  Filled 2022-04-01: qty 1

## 2022-04-01 MED ORDER — HEMOSTATIC AGENTS (NO CHARGE) OPTIME
TOPICAL | Status: DC | PRN
  Administered 2022-04-01: 1 via TOPICAL

## 2022-04-01 MED ORDER — CYANOCOBALAMIN 1000 MCG/ML IJ SOLN
1000.0000 ug | INTRAMUSCULAR | Status: DC
  Filled 2022-04-01: qty 1

## 2022-04-01 SURGICAL SUPPLY — 55 items
BAG COUNTER SPONGE SURGICOUNT (BAG) ×3 IMPLANT
BAND RUBBER #18 3X1/16 STRL (MISCELLANEOUS) ×4 IMPLANT
BASKET BONE COLLECTION (BASKET) ×2 IMPLANT
BENZOIN TINCTURE PRP APPL 2/3 (GAUZE/BANDAGES/DRESSINGS) ×2 IMPLANT
BIT DRILL NEURO 2X3.1 SFT TUCH (MISCELLANEOUS) ×1 IMPLANT
BONE CC-ACS 11X14X7 6D (Bone Implant) ×2 IMPLANT
BUR MATCHSTICK NEURO 3.0 LAGG (BURR) ×2 IMPLANT
CANISTER SUCT 3000ML PPV (MISCELLANEOUS) ×2 IMPLANT
CARTRIDGE OIL MAESTRO DRILL (MISCELLANEOUS) ×1 IMPLANT
CHIPS BONE CANC-ACS11X14X7 6D (Bone Implant) IMPLANT
DERMABOND ADVANCED (GAUZE/BANDAGES/DRESSINGS) ×1
DERMABOND ADVANCED .7 DNX12 (GAUZE/BANDAGES/DRESSINGS) IMPLANT
DIFFUSER DRILL AIR PNEUMATIC (MISCELLANEOUS) ×2 IMPLANT
DRAPE C-ARM 42X72 X-RAY (DRAPES) ×4 IMPLANT
DRAPE LAPAROTOMY 100X72 PEDS (DRAPES) ×2 IMPLANT
DRAPE MICROSCOPE LEICA (MISCELLANEOUS) ×2 IMPLANT
DRILL NEURO 2X3.1 SOFT TOUCH (MISCELLANEOUS) ×2
DRSG OPSITE POSTOP 4X6 (GAUZE/BANDAGES/DRESSINGS) ×1 IMPLANT
DURAPREP 6ML APPLICATOR 50/CS (WOUND CARE) ×2 IMPLANT
ELECT COATED BLADE 2.86 ST (ELECTRODE) ×2 IMPLANT
ELECT REM PT RETURN 9FT ADLT (ELECTROSURGICAL) ×2
ELECTRODE REM PT RTRN 9FT ADLT (ELECTROSURGICAL) ×1 IMPLANT
GAUZE SPONGE 4X4 12PLY STRL (GAUZE/BANDAGES/DRESSINGS) ×2 IMPLANT
GLOVE BIO SURGEON STRL SZ7 (GLOVE) ×1 IMPLANT
GLOVE BIO SURGEON STRL SZ8 (GLOVE) ×2 IMPLANT
GLOVE BIOGEL PI IND STRL 7.0 (GLOVE) IMPLANT
GLOVE BIOGEL PI INDICATOR 7.0 (GLOVE) ×1
GLOVE INDICATOR 8.5 STRL (GLOVE) ×2 IMPLANT
GLOVE SURG UNDER POLY LF SZ8.5 (GLOVE) ×2 IMPLANT
GOWN STRL REUS W/ TWL LRG LVL3 (GOWN DISPOSABLE) ×1 IMPLANT
GOWN STRL REUS W/ TWL XL LVL3 (GOWN DISPOSABLE) ×1 IMPLANT
GOWN STRL REUS W/TWL LRG LVL3 (GOWN DISPOSABLE) ×2
GOWN STRL REUS W/TWL XL LVL3 (GOWN DISPOSABLE) ×4
HALTER HD/CHIN CERV TRACTION D (MISCELLANEOUS) ×2 IMPLANT
HEMOSTAT POWDER KIT SURGIFOAM (HEMOSTASIS) ×2 IMPLANT
KIT BASIN OR (CUSTOM PROCEDURE TRAY) ×2 IMPLANT
KIT TURNOVER KIT B (KITS) ×2 IMPLANT
NDL SPNL 20GX3.5 QUINCKE YW (NEEDLE) ×1 IMPLANT
NEEDLE SPNL 20GX3.5 QUINCKE YW (NEEDLE) ×2 IMPLANT
NS IRRIG 1000ML POUR BTL (IV SOLUTION) ×2 IMPLANT
OIL CARTRIDGE MAESTRO DRILL (MISCELLANEOUS) ×2
PACK LAMINECTOMY NEURO (CUSTOM PROCEDURE TRAY) ×2 IMPLANT
PLATE CERV RES ACP 13 1L (Plate) ×1 IMPLANT
RASP HELIOCORDIAL MED (MISCELLANEOUS) ×1 IMPLANT
SCREW VA SD 4.2X12 (Screw) ×2 IMPLANT
SCREW VA SD 4.6X12 (Screw) ×2 IMPLANT
SPONGE INTESTINAL PEANUT (DISPOSABLE) ×2 IMPLANT
SPONGE SURGIFOAM ABS GEL SZ50 (HEMOSTASIS) ×2 IMPLANT
STRIP CLOSURE SKIN 1/2X4 (GAUZE/BANDAGES/DRESSINGS) ×2 IMPLANT
SUT VIC AB 3-0 SH 8-18 (SUTURE) ×2 IMPLANT
SUT VICRYL 4-0 PS2 18IN ABS (SUTURE) ×2 IMPLANT
TAPE CLOTH 4X10 WHT NS (GAUZE/BANDAGES/DRESSINGS) ×2 IMPLANT
TOWEL GREEN STERILE (TOWEL DISPOSABLE) ×2 IMPLANT
TOWEL GREEN STERILE FF (TOWEL DISPOSABLE) ×2 IMPLANT
WATER STERILE IRR 1000ML POUR (IV SOLUTION) ×2 IMPLANT

## 2022-04-01 NOTE — Progress Notes (Signed)
Doing well, ambulating in the room. Can d/c tomorrow morning.  ?

## 2022-04-01 NOTE — Op Note (Signed)
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 ?Postoperative diagnosis: Same ? ?Procedure: Anterior cervical discectomies and fusion at C3-4 utilizing allograft and the globus resonate plating system with cutting the superior aspect of the old plate and utilizing existing C4 screw holes ? ?Surgeon: Dominica Severin Kelcy Baeten ? ?Assistant: Nash Shearer ? ?Anesthesia: General ? ?EBL: Minimal ? ?HPI: 71 year old female progressive worsening left-sided neck pain shoulder pain work-up revealed cord compression at C3-4 exam was consistent with myelopathy and due to patient's progression of clinical syndrome imaging findings and failed conservative treatment I recommended anterior cervical discectomy and fusion at C3-4.  I extensively reviewed the risks and benefits of the operation with the patient as well as perioperative course expectations of outcome and alternatives of surgery and she understood and agreed to proceed forward. ? ?Operative procedure: Patient was brought into the OR was induced under general anesthesia positioned supine the neck in slight extension 5 pounds halter traction.  The left side of her neck was prepped and draped in routine sterile fashion and preoperative x-ray localized the appropriate level.  So a curvilinear incision was made just off the midline to the entry border of the sternocleidomastoid and the superficial abscess was dissected out divided longitudinally.  The avascular plane between the sternocleidomastoid and strap muscle was developed down to the prevertebral fascia and prevertebral fascia was dissected away with [place].  I immediately identified the old plate dissected inferior to the superior screws and C4 dissected out the disc base in the vertebral body of C3.  Self-retaining retractors were placed disc base was noted to be up against the superior aspect of the plate so at this point I cut the plate just inferior to the superior screws and  remove the top of the plate exposing the screw holes in the C4 vertebral body.  I then incised the disc scraped the disc with a BA curette and under microscopic lamination further drilled down the posterior aspect the disc base and posterior osteophytic complex.  Aggressive under biting of both endplates allowed identification of posterior logical ligament.  This was removed in piecemeal fashion several large fibers disc removed in addition a lot of central spur was also removed causing severe cord compression.  At the end of discectomy there is no further stenosis either centrally or foraminally the C4 nerve roots were decompressed and skeletonized flush with pedicle then I sized up an implant and it shows a 7 mm allograft spacer and a 13 mm globus resonate plate these were placed under fluoroscopy utilizing the old screw holes and C4 and drilling 2 holes in 3 everything was anchored in place and fluoroscopy confirmed good position of the implants.  The wound was then copiously irrigated meticulous hemostasis was maintained the wound was then closed in layers with interrupted Vicryl and a running 4 subcuticular.  Dermabond benzoin Steri-Strips and a sterile dressing was applied patient recovery in stable condition.  At the end the case all needle counts and sponge counts were correct. ?

## 2022-04-01 NOTE — Transfer of Care (Signed)
Immediate Anesthesia Transfer of Care Note ? ?Patient: Alexis Ramos ? ?Procedure(s) Performed: CERVICAL THREE-FOUR ANTERIOR CERVICAL DECOMPRESSION/DISCECTOMY FUSION WITH REMOVAL OF PREVIOUS ANTERIOR CERVICAL PLATE (Spine Cervical) ? ?Patient Location: PACU ? ?Anesthesia Type:General ? ?Level of Consciousness: oriented, drowsy and patient cooperative ? ?Airway & Oxygen Therapy: Patient Spontanous Breathing and Patient connected to nasal cannula oxygen ? ?Post-op Assessment: Report given to RN, Post -op Vital signs reviewed and stable and Patient moving all extremities X 4 ? ?Post vital signs: Reviewed and stable ? ?Last Vitals:  ?Vitals Value Taken Time  ?BP 163/82 04/01/22 0929  ?Temp    ?Pulse 68 04/01/22 0931  ?Resp 19 04/01/22 0931  ?SpO2 95 % 04/01/22 0931  ?Vitals shown include unvalidated device data. ? ?Last Pain:  ?Vitals:  ? 04/01/22 0628  ?TempSrc:   ?PainSc: 0-No pain  ?   ? ?  ? ?Complications: No notable events documented. ?

## 2022-04-01 NOTE — H&P (Signed)
Alexis Ramos is an 71 y.o. female.   ?Chief Complaint: Neck pain ?HPI: 71 year old female with left-sided neck pain going on for now for the last several weeks to months get progressively worse.  Work-up revealed severe cervical spondylosis segmental breakdown above her previous fusion and spinal cord compression at C3-4.  Due to patient's progression of clinical syndrome imaging findings and failed conservative treatment I recommended anterior cervical discectomy and fusion at C3-4 including cutting off of the old plate top of it to make room for the new plate.  I extensively reviewed the risks and benefits of the operation with her as well as perioperative course expectations of outcome and alternatives to surgery and she understands and agrees to proceed forward. ? ?Past Medical History:  ?Diagnosis Date  ? Anemia   ? Arthritis   ? Cancer [facility])   ? melanoma on back   ? Crohn's disease ([place])   ? Depression   ? DES exposure in utero   ? Diabetes ([place])   ? type 2  ? Forgetfulness   ? GERD (gastroesophageal reflux disease)   ? History of kidney stones   ? Hyperlipidemia   ? Hypertension   ? Kidney stones   ? Menopausal symptoms   ? SBO (small bowel obstruction) ([place]) 06/2011, 11/2018  ? tx w/steroids  ? Snoring   ? TIA (transient ischemic attack) 04/2020  ? ? ?Past Surgical History:  ?Procedure Laterality Date  ? Anal abscess surg    ? anal sphincterectomy    ? APPENDECTOMY  1970  ? [place]  ? small, x 3  ? BREAST BIOPSY Left   ? BREAST SURGERY    ? Biopsy-Benign  ? CERVICAL FUSION  05/2009  ? C4-C7  ? CHOLECYSTECTOMY N/A 08/10/2016  ? Procedure: LAPAROSCOPIC CHOLECYSTECTOMY WITH INTRAOPERATIVE CHOLANGIOGRAM -STANDARD 4 PORT;  Surgeon: [name], MD;  Location: [facility];  Service: General;  Laterality: N/A;  ? [place]  ? HAND SURGERY    ? ILEOCECETOMY  1971  ? Crohns disease flare  ? KIDNEY SURGERY  1992  ? x2  ? KNEE SURGERY Left 1998  ? LAPAROSCOPIC LYSIS  OF ADHESIONS N/A 08/10/2016  ? Procedure: LAPAROSCOPIC LYSIS OF ADHESIONS WITH CORE LIVER BIOPSY;  Surgeon: [name], MD;  Location: [facility];  Service: General;  Laterality: N/A;  ? LIPOSUCTION  2004  ? neck  ? NASAL SINUS SURGERY  1995  ? PR DRAIN/INJECT LARGE JOINT/BURSA  05/11/2021  ?    ? SKIN CANCER EXCISION  03/2016  ? melanoma, back  ? SMALL INTESTINE SURGERY  1985  ? Crohn stricture  ? SMALL INTESTINE SURGERY  1996  ? Crohn stricture  ? Thumb surg    ? TONGUE BASE REDUCTION SOMNOPLASTY  2000  ? Removed uvula  ? TONSILLECTOMY AND ADENOIDECTOMY  1959  ? [place]  ? ? ?Family History  ?Problem Relation Age of Onset  ? Diabetes Mother   ? Hypertension Mother   ? Osteoporosis Mother   ? Hypertension Father   ? Heart disease Father   ? Prostate cancer Father   ? Diabetes Brother   ? Hypertension Brother   ? Cancer Other   ?     Niece-Glioblastoma  ? Breast cancer Neg Hx   ? ?Social History:  reports that she has never smoked. She has never used smokeless tobacco. She reports that she does not drink alcohol and does  not use drugs. ? ?Allergies:  ?Allergies  ?Allergen Reactions  ? Nsaids Other (See Comments)  ?  H/o Crohn's disease  ? Penicillins Anaphylaxis  ?  Pt grandmother died from it and her brother had anaphylaxis so she was told never to take.  ?Has patient had a PCN reaction causing immediate rash, facial/tongue/throat swelling, SOB or lightheadedness with hypotension: No ?Has patient had a PCN reaction causing severe rash involving mucus membranes or skin necrosis: No ?Has patient had a PCN reaction that required hospitalization No ?Has patient had a PCN reaction occurring within the last 10 years: No ?If all of the above answers are "NO", then may pr  ? Advil [Ibuprofen]   ?  Bleeding.   ? Asa [Aspirin]   ?  Hemorrhage.     ? Daypro [Oxaprozin]   ?  Bleeding.  ? ? ?Medications Prior to Admission  ?Medication Sig Dispense Refill  ? amLODipine (NORVASC) 5 MG tablet Take 1 tablet (5 mg  total) by mouth daily. 30 tablet 0  ? cyanocobalamin (,VITAMIN B-12,) 1000 MCG/ML injection Inject 1,000 mcg into the muscle every 21 ( twenty-one) days.    ? fenofibrate 54 MG tablet Take 54 mg by mouth every other day.    ? losartan (COZAAR) 100 MG tablet Take 1 tablet (100 mg total) by mouth daily. 30 tablet 0  ? metoprolol succinate (TOPROL-XL) 100 MG 24 hr tablet Take 50 mg by mouth 2 (two) times daily.    ? Multiple Vitamins-Minerals (PRESERVISION AREDS) CAPS Take 1 capsule by mouth 2 (two) times daily.    ? omeprazole (PRILOSEC) 20 MG capsule Take 20 mg by mouth daily as needed (heartburn).    ? pioglitazone (ACTOS) 15 MG tablet Take 15 mg by mouth daily.    ? venlafaxine (EFFEXOR) 75 MG tablet Take 150 mg by mouth 2 (two) times daily with a meal.    ? Vitamin E 400 units TABS Take 400 Units by mouth 2 (two) times daily.    ? potassium chloride SA (KLOR-CON) 20 MEQ tablet Take 1 tablet (20 mEq total) by mouth 2 (two) times daily for 10 days. (Patient not taking: Reported on 03/29/2022) 20 tablet 0  ? saccharomyces boulardii (FLORASTOR) 250 MG capsule Take 1 capsule (250 mg total) by mouth 2 (two) times daily. (Patient taking differently: Take 250 mg by mouth daily as needed (When on antiboitic).) 30 capsule 0  ? triamcinolone (NASACORT) 55 MCG/ACT AERO nasal inhaler Place 1 spray into the nose daily as needed (allergy).    ? ? ?Results for orders placed or performed during the hospital encounter of 04/01/22 (from the past 48 hour(s))  ?Glucose, capillary     Status: Abnormal  ? Collection Time: 04/01/22  6:08 AM  ?Result Value Ref Range  ? Glucose-Capillary 199 (H) 70 - 99 mg/dL  ?  Comment: Glucose reference range applies only to samples taken after fasting for at least 8 hours.  ? ?No results found. ? ?Review of Systems  ?Musculoskeletal:  Positive for neck pain.  ?Neurological:  Positive for numbness.  ? ?Blood pressure (!) 154/71, pulse 72, temperature 98.2 ?F (36.8 ?C), temperature source Oral, resp. rate  18, height 4' 11"  (1.499 m), weight 54.4 kg, SpO2 98 %. ?Physical Exam ?HENT:  ?   Head: Normocephalic.  ?   Right Ear: Tympanic membrane normal.  ?   Nose: Nose normal.  ?   Mouth/Throat:  ?   Mouth: Mucous membranes are moist.  ?Eyes:  ?  Pupils: Pupils are equal, round, and reactive to light.  ?Cardiovascular:  ?   Rate and Rhythm: Normal rate.  ?Pulmonary:  ?   Effort: Pulmonary effort is normal.  ?Abdominal:  ?   General: Abdomen is flat.  ?Musculoskeletal:     ?   General: Normal range of motion.  ?Skin: ?   General: Skin is warm.  ?Neurological:  ?   Mental Status: She is alert.  ?   Comments: Strength is 5 out of 5 deltoid, bicep, tricep, wrist flexion, wrist extension, hand intrinsics.  ?  ? ?Assessment/Plan ?71 year old presents for anterior cervical discectomy and fusion C3-4 ? ?Elaina Hoops, MD ?04/01/2022, 7:06 AM ? ? ? ?

## 2022-04-01 NOTE — Anesthesia Procedure Notes (Signed)
Procedure Name: Intubation ?Date/Time: 04/01/2022 7:42 AM ?Performed by: Gaylene Brooks, CRNA ?Pre-anesthesia Checklist: Patient identified, Emergency Drugs available, Suction available and Patient being monitored ?Patient Re-evaluated:Patient Re-evaluated prior to induction ?Oxygen Delivery Method: Circle System Utilized ?Preoxygenation: Pre-oxygenation with 100% oxygen ?Induction Type: IV induction ?Ventilation: Mask ventilation without difficulty ?Laryngoscope Size: Glidescope and 3 ?Grade View: Grade I ?Tube type: Oral ?Tube size: 7.0 mm ?Number of attempts: 1 ?Airway Equipment and Method: Stylet, Oral airway and Video-laryngoscopy ?Placement Confirmation: ETT inserted through vocal cords under direct vision, positive ETCO2 and breath sounds checked- equal and bilateral ?Secured at: 19 cm ?Tube secured with: Tape ?Dental Injury: Teeth and Oropharynx as per pre-operative assessment  ?Difficulty Due To: Difficult Airway- due to reduced neck mobility ?Comments: Elective glidescope due to pt's neck pathology and history of glidescope intubations. Easy intubation with Glide.  ? ? ? ? ?

## 2022-04-01 NOTE — Anesthesia Postprocedure Evaluation (Signed)
Anesthesia Post Note ? ?Patient: Alexis Ramos ? ?Procedure(s) Performed: CERVICAL THREE-FOUR ANTERIOR CERVICAL DECOMPRESSION/DISCECTOMY FUSION WITH REMOVAL OF PREVIOUS ANTERIOR CERVICAL PLATE (Spine Cervical) ? ?  ? ?Patient location during evaluation: PACU ?Anesthesia Type: General ?Level of consciousness: sedated and patient cooperative ?Pain management: pain level controlled ?Vital Signs Assessment: post-procedure vital signs reviewed and stable ?Respiratory status: spontaneous breathing ?Cardiovascular status: stable ?Anesthetic complications: no ? ? ?No notable events documented. ? ?Last Vitals:  ?Vitals:  ? 04/01/22 1000 04/01/22 1031  ?BP: (!) 156/76 (!) 161/71  ?Pulse: (!) 56 (!) 58  ?Resp: 18 16  ?Temp:    ?SpO2: 99% 99%  ?  ?Last Pain:  ?Vitals:  ? 04/01/22 1000  ?TempSrc:   ?PainSc: 0-No pain  ? ? ?  ?  ?  ?  ?  ?  ? ?Nolon Nations ? ? ? ? ?

## 2022-04-01 NOTE — Progress Notes (Signed)
Pharmacy Antibiotic Note ? ?Alexis Ramos is a 71 y.o. female admitted on 04/01/2022 with surgical prophylaxis.  Pharmacy has been consulted for Vancomycin dosing. ? ?Pt received Vancomycin 1gm IV ~0700 pre-op this morning. ? ?Plan: ?The 1gm Vancomycin already given will cover 24 hour time period so no more vanc needed . ?Pharmacy will sign off - please reconsult if needed. ? ?Height: 4' 11"  (149.9 cm) ?Weight: 54.4 kg (120 lb) ?IBW/kg (Calculated) : 43.2 ? ?Temp (24hrs), Avg:98.1 ?F (36.7 ?C), Min:98 ?F (36.7 ?C), Max:98.2 ?F (36.8 ?C) ? ?Recent Labs  ?Lab 03/30/22 ?0829  ?WBC 13.6*  ?CREATININE 1.19*  ?  ?Estimated Creatinine Clearance: 31.7 mL/min (A) (by C-G formula based on SCr of 1.19 mg/dL (H)).   ? ?Allergies  ?Allergen Reactions  ? Nsaids Other (See Comments)  ?  H/o Crohn's disease  ? Penicillins Anaphylaxis  ?  Pt grandmother died from it and her brother had anaphylaxis so she was told never to take.  ?Has patient had a PCN reaction causing immediate rash, facial/tongue/throat swelling, SOB or lightheadedness with hypotension: No ?Has patient had a PCN reaction causing severe rash involving mucus membranes or skin necrosis: No ?Has patient had a PCN reaction that required hospitalization No ?Has patient had a PCN reaction occurring within the last 10 years: No ?If all of the above answers are "NO", then may pr  ? Advil [Ibuprofen]   ?  Bleeding.   ? Asa [Aspirin]   ?  Hemorrhage.     ? Daypro [Oxaprozin]   ?  Bleeding.  ? ? ?Thank you for allowing pharmacy to be a part of this patientAlexiss care. ? ?Sherlon Handing, PharmD, BCPS ?Please see amion for complete clinical pharmacist phone list ?04/01/2022 10:41 AM ? ?

## 2022-04-02 LAB — GLUCOSE, CAPILLARY: Glucose-Capillary: 127 mg/dL — ABNORMAL HIGH (ref 70–99)

## 2022-04-02 NOTE — Progress Notes (Signed)
Neurosurgery Service ?Progress Note ? ?Subjective: No acute events overnight, did great overnight - no pain medication, no pain this morning, preop paresthesias are mildly improved  ? ?Objective: ?Vitals:  ? 04/01/22 1031 04/01/22 1539 04/01/22 2007 04/02/22 0242  ?BP: (!) 161/71 (!) 145/77 132/67 120/69  ?Pulse: (!) 58 64 63 64  ?Resp: 16 18 18 18   ?Temp:  98 ?F (36.7 ?C) 98.2 ?F (36.8 ?C) 97.9 ?F (36.6 ?C)  ?TempSrc:  Oral Oral Oral  ?SpO2: 99% 97% 97% 100%  ?Weight:      ?Height:      ? ? ?Physical Exam: ?Strength 5/5 x4 and SILTx4 except some mild stocking numbness, incision c/d/I w/ dressing ? ?Assessment & Plan: ?71 y.o. woman s/p C3-4 ACDF, recovering well. ? ?-discharge home today ? ?Marcello Moores A Amyla Heffner  ?04/02/22 ?7:28 AM ? ?

## 2022-04-02 NOTE — Evaluation (Signed)
Physical Therapy Evaluation and Discharge ?Patient Details ?Name: Alexis Ramos ?MRN: [id number] ?DOB: 10/21/1951 ?Today's Date: 04/02/2022 ? ?History of Present Illness ? Pt is a 71 y.o. F who presents 4/21 now s/p C3-4 ACDF. Significant PMH: CA, Crohn's disease, DM2, cervical surgery.  ?Clinical Impression ? Patient evaluated by Physical Therapy with no further acute PT needs identified. PTA, pt lives alone and is independent. Pt overall verbalizing good pain control; reports continued numbness in bilateral fingertips. Pt ambulating 400 ft with no assistive device and negotiated 4 steps with a railing. Education reviewed regarding cervical precautions, activity recommendations, car transfer, ADL's. All education has been completed and the patient has no further questions. No follow-up Physical Therapy or equipment needs. PT is signing off. Thank you for this referral. ? ?   ? ?Recommendations for follow up therapy are one component of a multi-disciplinary discharge planning process, led by the attending physician.  Recommendations may be updated based on patient status, additional functional criteria and insurance authorization. ? ?Follow Up Recommendations No PT follow up ? ?  ?Assistance Recommended at Discharge PRN  ?Patient can return home with the following ? Assistance with cooking/housework;Assist for transportation ? ?  ?Equipment Recommendations None recommended by PT  ?Recommendations for Other Services ?    ?  ?Functional Status Assessment Patient has had a recent decline in their functional status and demonstrates the ability to make significant improvements in function in a reasonable and predictable amount of time.  ? ?  ?Precautions / Restrictions Precautions ?Precautions: Fall;Cervical ?Precaution Booklet Issued: Yes (comment) ?Precaution Comments: verbally reviewed, provided written handout ?Required Braces or Orthoses:  (No brace needed) ?Restrictions ?Weight Bearing Restrictions: No  ? ?   ? ?Mobility ? Bed Mobility ?Overal bed mobility: Modified Independent ?  ?  ?  ?  ?  ?  ?  ?  ? ?Transfers ?Overall transfer level: Independent ?Equipment used: None ?  ?  ?  ?  ?  ?  ?  ?  ?  ? ?Ambulation/Gait ?Ambulation/Gait assistance: Modified independent (Device/Increase time) ?Gait Distance (Feet): 400 Feet ?Assistive device: None ?Gait Pattern/deviations: Step-through pattern, Decreased stride length, Drifts right/left ?Gait velocity: decreased ?Gait velocity interpretation: <1.8 ft/sec, indicate of risk for recurrent falls ?  ?General Gait Details: Pt reaching out intermittently for external support, mild dynamic instability, no physical assist required ? ?Stairs ?Stairs: Yes ?Stairs assistance: Modified independent (Device/Increase time) ?Stair Management: One rail Left ?Number of Stairs: 4 ?General stair comments: step over step ascending, step by step descending ? ?Wheelchair Mobility ?  ? ?Modified Rankin (Stroke Patients Only) ?  ? ?  ? ?Balance Overall balance assessment: Mild deficits observed, not formally tested ?  ?  ?  ?  ?  ?  ?  ?  ?  ?  ?  ?  ?  ?  ?  ?  ?  ?  ?   ? ? ? ?Pertinent Vitals/Pain Pain Assessment ?Pain Assessment: Faces ?Faces Pain Scale: Hurts a little bit ?Pain Location: neck ?Pain Descriptors / Indicators: Operative site guarding ?Pain Intervention(s): Monitored during session  ? ? ?Home Living Family/patient expects to be discharged to:: Private residence ?Living Arrangements: Alone ?Available Help at Discharge: Available PRN/intermittently;Friend(s) ?Type of Home: House ?Home Access: Stairs to enter ?Entrance Stairs-Rails: Right;Left ?Entrance Stairs-Number of Steps: 4 ?  ?Home Layout: One level ?Home Equipment: Conservation officer, nature (2 wheels);Rollator (4 wheels);Cane - single point;Wheelchair - manual ?Additional Comments: Has 3 dogs  ?  ?Prior Function  Prior Level of Function : Independent/Modified Independent ?  ?  ?  ?  ?  ?  ?  ?  ?  ? ? ?Hand Dominance  ? Dominant Hand:  Right ? ?  ?Extremity/Trunk Assessment  ? Upper Extremity Assessment ?Upper Extremity Assessment: RUE deficits/detail;LUE deficits/detail ?RUE Deficits / Details: Grossly 4/5, reports numbness in fingertips ?LUE Deficits / Details: Grossly 4/5, reports numbness in fingertips ?  ? ?Lower Extremity Assessment ?Lower Extremity Assessment: RLE deficits/detail;LLE deficits/detail ?RLE Deficits / Details: Strength 5/5 ?LLE Deficits / Details: Strength 5/5 ?  ? ?Cervical / Trunk Assessment ?Cervical / Trunk Assessment: Neck Surgery  ?Communication  ? Communication: No difficulties  ?Cognition Arousal/Alertness: Awake/alert ?Behavior During Therapy: [facility] for tasks assessed/performed ?Overall Cognitive Status: Within Functional Limits for tasks assessed ?  ?  ?  ?  ?  ?  ?  ?  ?  ?  ?  ?  ?  ?  ?  ?  ?  ?  ?  ? ?  ?General Comments   ? ?  ?Exercises    ? ?Assessment/Plan  ?  ?PT Assessment Patient does not need any further PT services  ?PT Problem List   ? ?   ?  ?PT Treatment Interventions     ? ?PT Goals (Current goals can be found in the Care Plan section)  ?Acute Rehab PT Goals ?Patient Stated Goal: go home ?PT Goal Formulation: All assessment and education complete, DC therapy ? ?  ?Frequency   ?  ? ? ?Co-evaluation   ?  ?  ?  ?  ? ? ?  ?AM-PAC PT "6 Clicks" Mobility  ?Outcome Measure Help needed turning from your back to your side while in a flat bed without using bedrails?: None ?Help needed moving from lying on your back to sitting on the side of a flat bed without using bedrails?: None ?Help needed moving to and from a bed to a chair (including a wheelchair)?: None ?Help needed standing up from a chair using your arms (e.g., wheelchair or bedside chair)?: None ?Help needed to walk in hospital room?: None ?Help needed climbing 3-5 steps with a railing? : None ?6 Click Score: 24 ? ?  ?End of Session Equipment Utilized During Treatment: Cervical collar (pt wearing soft collar for comfort) ?Activity Tolerance: Patient  tolerated treatment well ?Patient left: in bed;with call bell/phone within reach ?Nurse Communication: Mobility status ?PT Visit Diagnosis: Unsteadiness on feet (R26.81);Pain ?Pain - part of body:  (neck) ?  ? ?Time: 6333-5456 ?PT Time Calculation (min) (ACUTE ONLY): 20 min ? ? ?Charges:   PT Evaluation ?$PT Eval Low Complexity: 1 Low ?  ?  ?   ? ? ?Wyona Almas, PT, DPT ?Acute Rehabilitation Services ?Pager [phone] ?Office [phone] ? ? ?Alexis Ramos ?04/02/2022, 8:47 AM ? ?

## 2022-04-02 NOTE — Discharge Summary (Signed)
Discharge Summary ? ?Date of Admission: 04/01/2022 ? ?Date of Discharge: 04/02/22 ? ?Attending Physician: Kary Kos, MD ? ?Hospital Course: Patient was admitted following an uncomplicated T4-1 ACDF. They were recovered in PACU and transferred to [facility]. Their preop symptoms were improved, their hospital course was uncomplicated and the patient was discharged home on 04/02/22. They will follow up in clinic with me in clinic in 2 weeks. ? ?Neurologic exam at discharge:  ?Strength 5/5 x4 and SILTx4 except mild stocking numbness ? ?Discharge diagnosis: Cervical myelopathy ? ?Judith Part, MD ?04/02/22 ?7:31 AM ? ? ?

## 2022-04-02 NOTE — Progress Notes (Signed)
Patient is discharged from room 3C02 at this time. Alert and in stable condition. IV site d/c'd and instructions read to patient and spouse with understanding verbalized and all questions answered. Left unit via wheelchair with all belongings at side.  ?

## 2022-04-04 ENCOUNTER — Encounter (HOSPITAL_COMMUNITY): Payer: Self-pay | Admitting: Neurosurgery

## 2022-05-10 DIAGNOSIS — Z08 Encounter for follow-up examination after completed treatment for malignant neoplasm: Secondary | ICD-10-CM | POA: Diagnosis not present

## 2022-05-10 DIAGNOSIS — L280 Lichen simplex chronicus: Secondary | ICD-10-CM | POA: Diagnosis not present

## 2022-05-10 DIAGNOSIS — D235 Other benign neoplasm of skin of trunk: Secondary | ICD-10-CM | POA: Diagnosis not present

## 2022-05-10 DIAGNOSIS — Z872 Personal history of diseases of the skin and subcutaneous tissue: Secondary | ICD-10-CM | POA: Diagnosis not present

## 2022-05-10 DIAGNOSIS — S90935A Unspecified superficial injury of left lesser toe(s), initial encounter: Secondary | ICD-10-CM | POA: Diagnosis not present

## 2022-05-10 DIAGNOSIS — Z85828 Personal history of other malignant neoplasm of skin: Secondary | ICD-10-CM | POA: Diagnosis not present

## 2022-05-10 DIAGNOSIS — L578 Other skin changes due to chronic exposure to nonionizing radiation: Secondary | ICD-10-CM | POA: Diagnosis not present

## 2022-05-10 DIAGNOSIS — R238 Other skin changes: Secondary | ICD-10-CM | POA: Diagnosis not present

## 2022-05-10 DIAGNOSIS — L281 Prurigo nodularis: Secondary | ICD-10-CM | POA: Diagnosis not present

## 2022-05-12 DIAGNOSIS — M48062 Spinal stenosis, lumbar region with neurogenic claudication: Secondary | ICD-10-CM | POA: Diagnosis not present

## 2022-05-31 DIAGNOSIS — M4316 Spondylolisthesis, lumbar region: Secondary | ICD-10-CM | POA: Diagnosis not present

## 2022-05-31 DIAGNOSIS — G959 Disease of spinal cord, unspecified: Secondary | ICD-10-CM | POA: Diagnosis not present

## 2022-06-03 ENCOUNTER — Other Ambulatory Visit: Payer: Self-pay | Admitting: Neurosurgery

## 2022-06-10 IMAGING — MR MR CERVICAL SPINE W/O CM
5 series · 34 of 48 positions shown · non-contrast
Comparison: Cervical spine CT 02/08/2021 and MRI 04/21/2009

CLINICAL DATA: Neck pain. Bilateral hand numbness and leg weakness.

EXAM:
MRI CERVICAL SPINE WITHOUT CONTRAST
TECHNIQUE: Multiplanar, multisequence MR imaging of the cervical spine was
performed. No intravenous contrast was administered.

[Series 2: T2 · sagittal · 3.0mm · 0.43mm/px · 8 of 21 slices shown (1 of 2)]
[im 1/21]
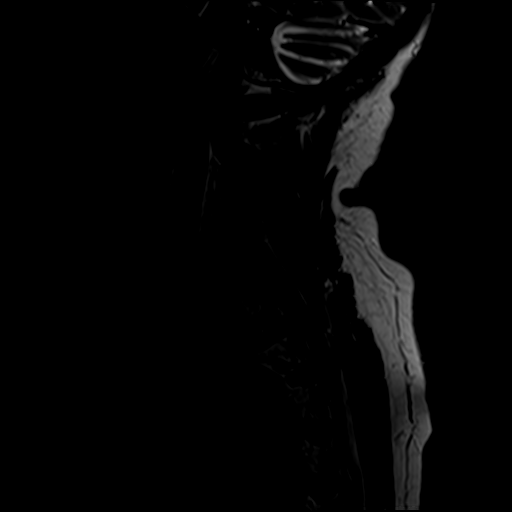
[im 3/21]
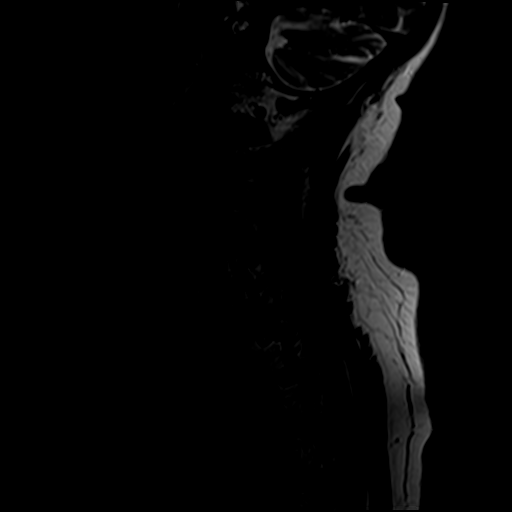
[im 6/21]
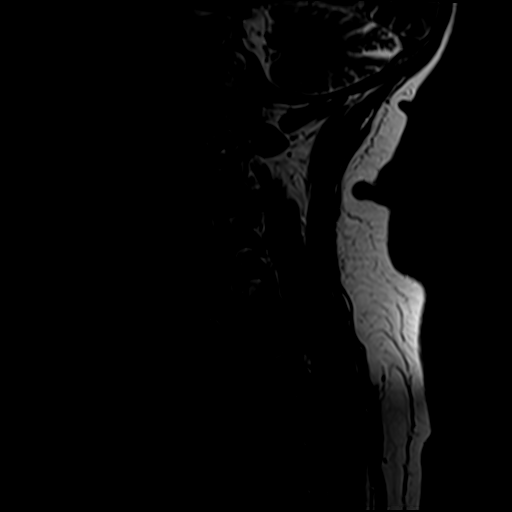
[im 9/21]
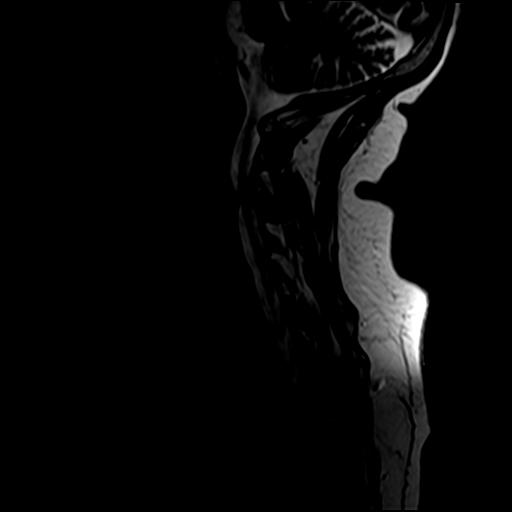
[im 12/21]
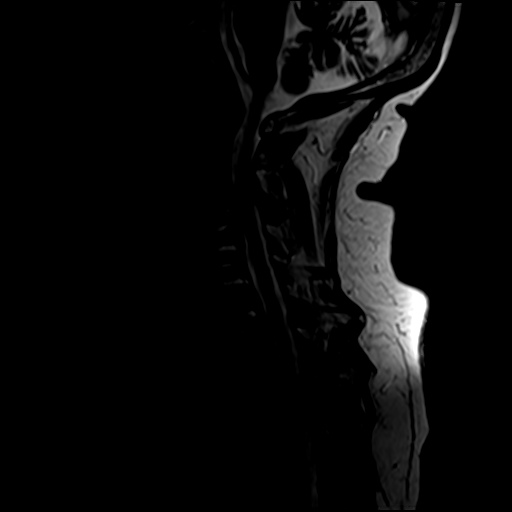
[im 15/21]
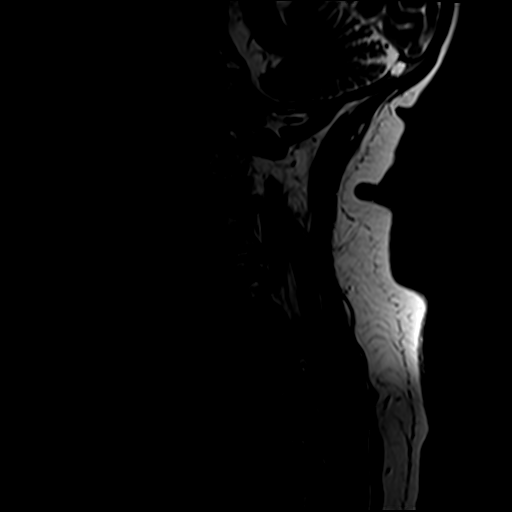
[im 18/21]
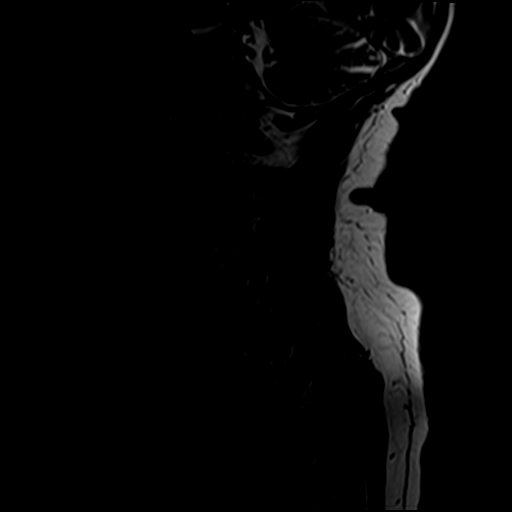
[im 21/21]
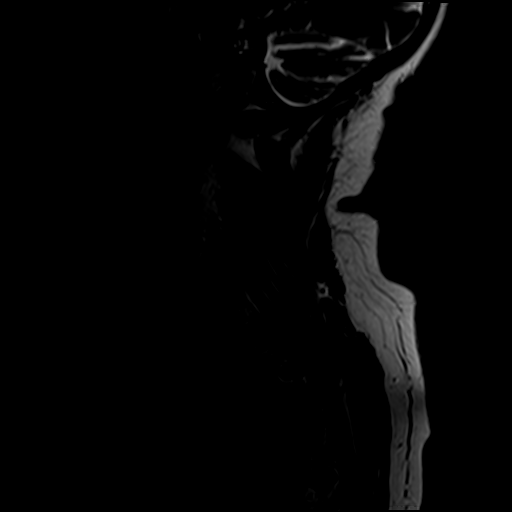

[Series 3: STIR · sagittal · 3.0mm · 0.86mm/px · 7 of 21 slices shown]
[im 1/21]
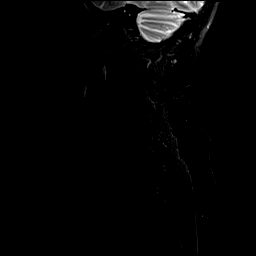
[im 4/21]
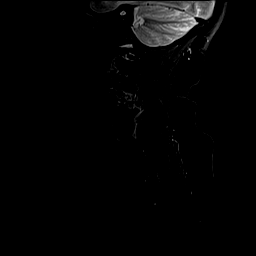
[im 7/21]
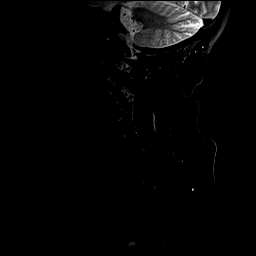
[im 11/21]
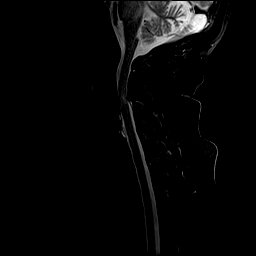
[im 14/21]
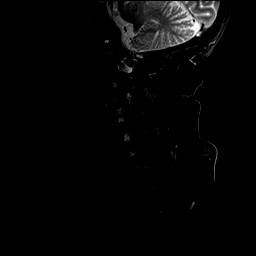
[im 17/21]
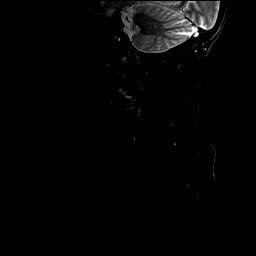
[im 21/21]
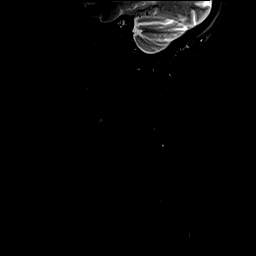

[Series 4: T1 · sagittal · 3.0mm · 0.86mm/px · 7 of 21 slices shown]
[im 1/21]
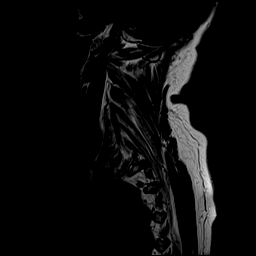
[im 4/21]
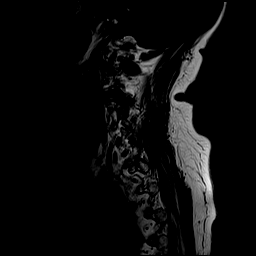
[im 7/21]
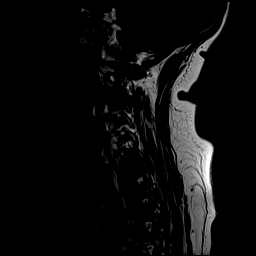
[im 11/21]
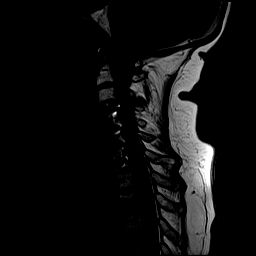
[im 14/21]
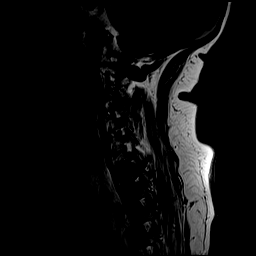
[im 17/21]
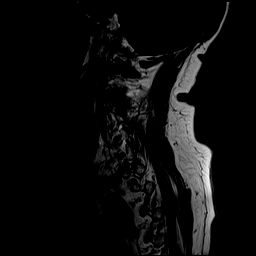
[im 21/21]
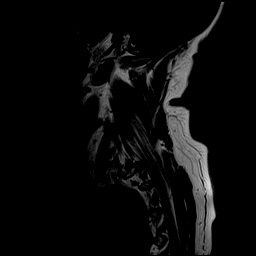

[Series 5: T2 · axial · 3.0mm · 0.78mm/px · z∈[-126,+16]mm · 9 of 40 slices shown (2 of 2)]
[im 1/40]
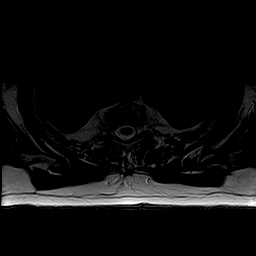
[im 7/40]
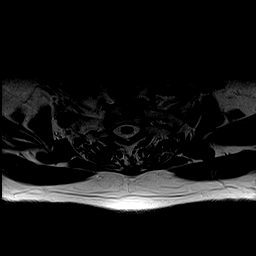
[im 14/40]
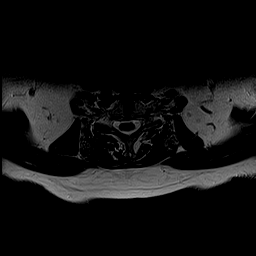
[im 17/40]
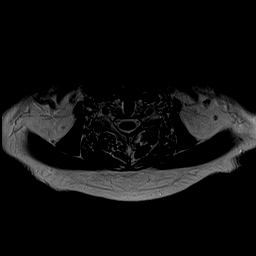
[im 20/40]
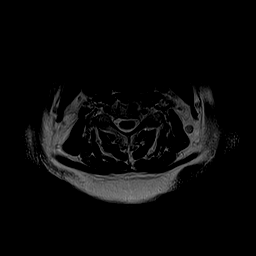
[im 23/40]
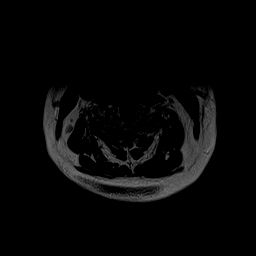
[im 27/40]
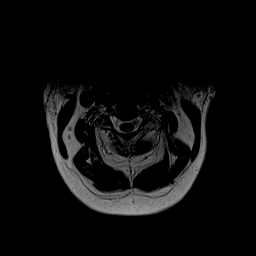
[im 33/40]
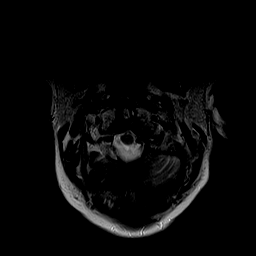
[im 40/40]
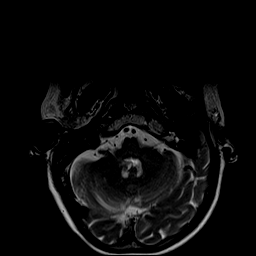

[Series 6: GRE · axial · 3.0mm · 0.39mm/px · z∈[-126,-78]mm · 3 of 40 slices shown]
[im 1/40]
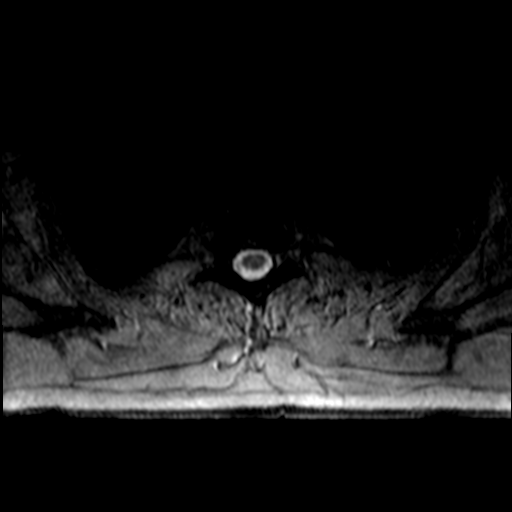
[im 7/40]
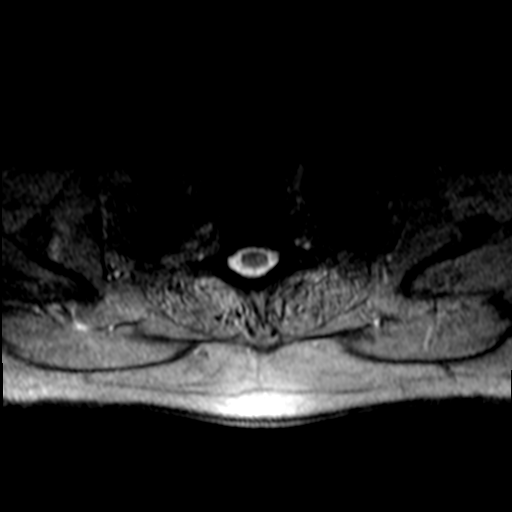
[im 14/40]
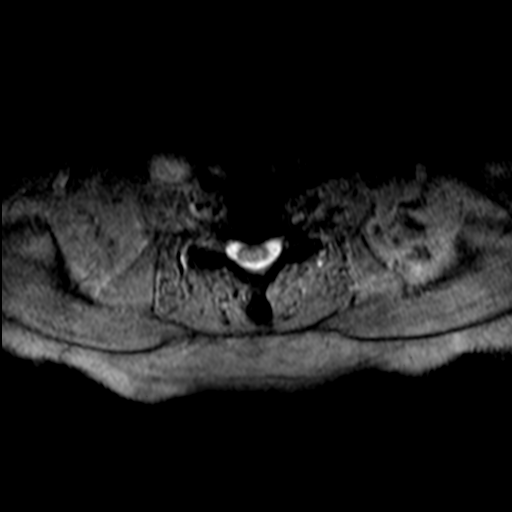

[34 of 48 positions shown; findings below may reference images not displayed]

FINDINGS: Alignment: Straightening of the normal cervical lordosis. No
listhesis.

Vertebrae: No fracture or suspicious marrow lesion. C4-C7 ACDF.
Prominent facet edema at C3-4, left greater than right.

Cord: Suspected new mild T2 hyperintensity in the spinal cord at
C3-4.

Posterior Fossa, vertebral arteries, paraspinal tissues: Patchy T2
hyperintensity in the pons, nonspecific but most often seen with
chronic small vessel ischemia. Preserved vertebral artery flow
voids.

Disc levels:

C2-3: Facet arthrosis with facet ankylosis bilaterally. No disc
herniation or stenosis.

C3-4: A broad-based posterior disc osteophyte complex, more focal
right paracentral disc protrusion, infolding of the ligamentum
flavum, and severe facet arthrosis result in severe spinal stenosis
with moderate cord flattening and severe right and mild left neural
foraminal stenosis.

C4-5: ACDF. Widely patent spinal canal and left neural foramen. Mild
right neural foraminal narrowing due to spurring.

C5-6: ACDF.  No stenosis.

C6-7: ACDF.  No stenosis.

C7-T1: Mild facet arthrosis and asymmetric right costovertebral
arthrosis resulting in mild right greater than left neural foraminal
stenosis without spinal stenosis.

T1-2: Left foraminal endplate spurring and mild facet arthrosis
result in mild left neural foraminal stenosis without spinal
stenosis.
IMPRESSION: 1. Adjacent segment disease at C3-4 including severe facet arthrosis
with prominent edema. Severe spinal stenosis and severe right neural
foraminal stenosis with suspected mild edema or myelomalacia in the
spinal cord at this level.
2. C4-C7 ACDF without significant residual stenosis.
3. Mild neural foraminal stenosis at C7-T1 and T1-2.

## 2022-06-10 IMAGING — MR MR LUMBAR SPINE W/O CM
4 of 5 series · 26 of 48 positions shown · non-contrast
Comparison: Lumbar spine MRI 03/16/2010. CT abdomen and pelvis
05/03/2021 and 07/24/2020.

CLINICAL DATA: Low back pain. Bilateral leg weakness with
difficulty walking and imbalance.

EXAM:
MRI LUMBAR SPINE WITHOUT CONTRAST
TECHNIQUE: Multiplanar, multisequence MR imaging of the lumbar spine was
performed. No intravenous contrast was administered.

[Series 3: T2 · sagittal · 4.0mm · 1.09mm/px · 6 of 21 slices shown (1 of 2)]
[im 1/21]
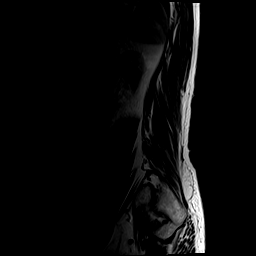
[im 5/21]
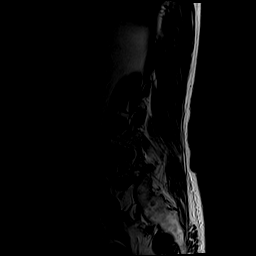
[im 9/21]
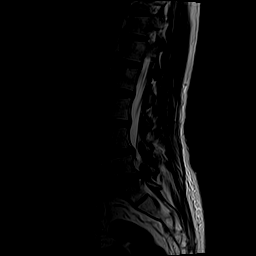
[im 13/21]
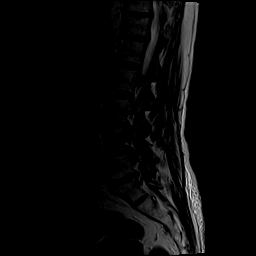
[im 17/21]
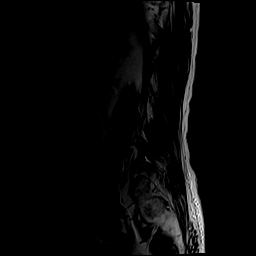
[im 21/21]
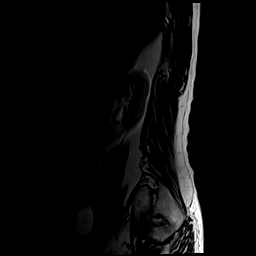

[Series 5: T1 · sagittal · 4.0mm · 1.17mm/px · 6 of 21 slices shown (1 of 2)]
[im 1/21]
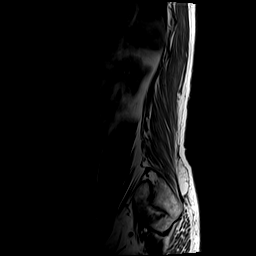
[im 5/21]
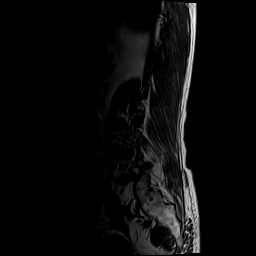
[im 9/21]
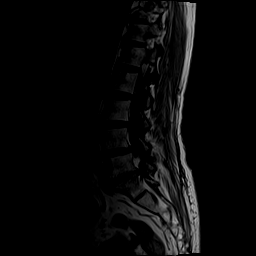
[im 13/21]
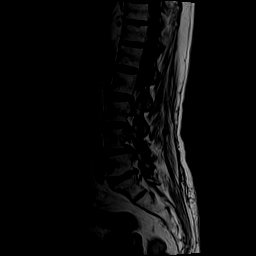
[im 17/21]
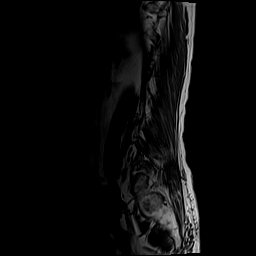
[im 21/21]
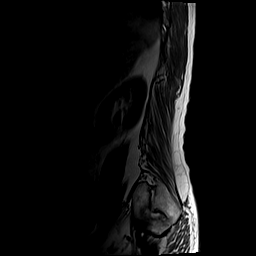

[Series 6: T2 · axial · 4.0mm · 0.39mm/px · z∈[-97,+157]mm · 9 of 53 slices shown (2 of 2)]
[im 1/53]
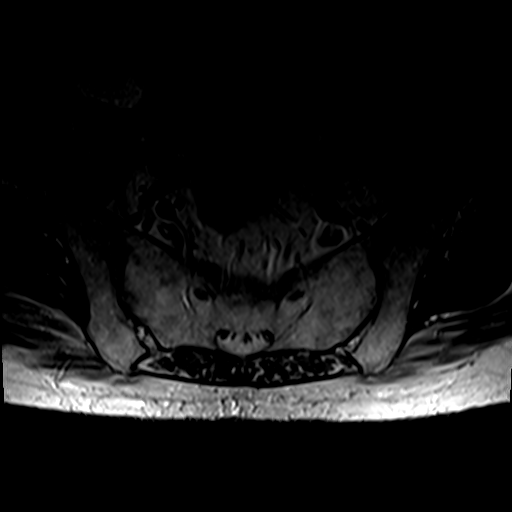
[im 8/53]
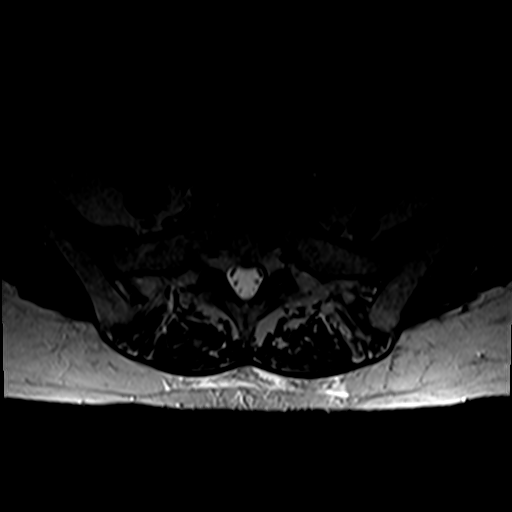
[im 15/53]
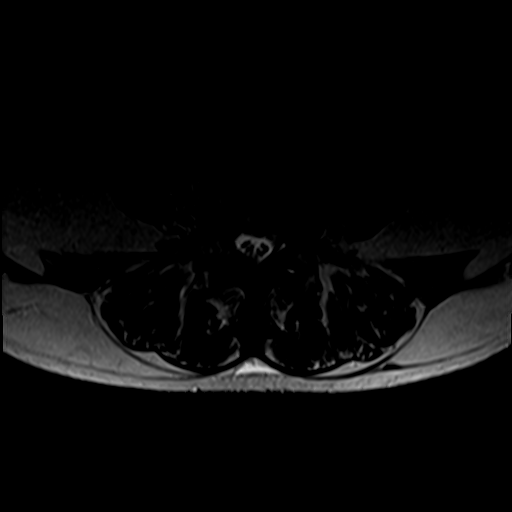
[im 23/53]
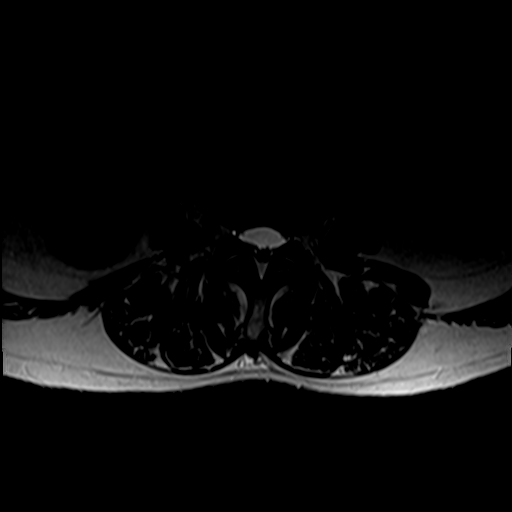
[im 27/53]
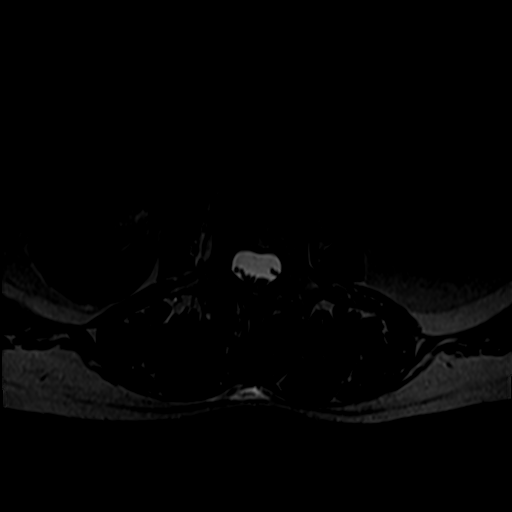
[im 30/53]
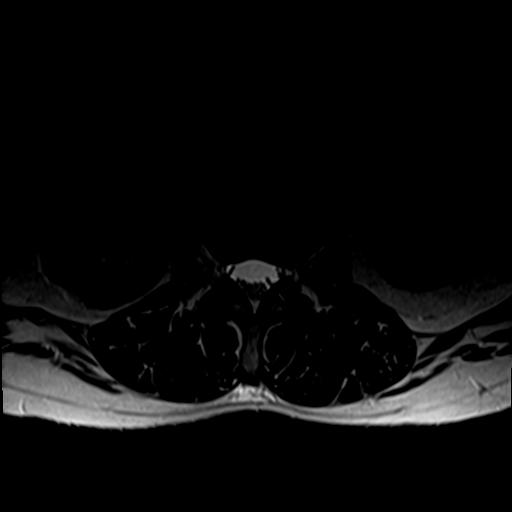
[im 38/53]
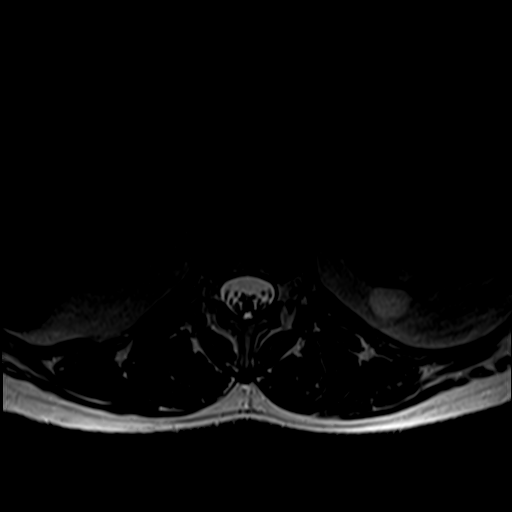
[im 45/53]
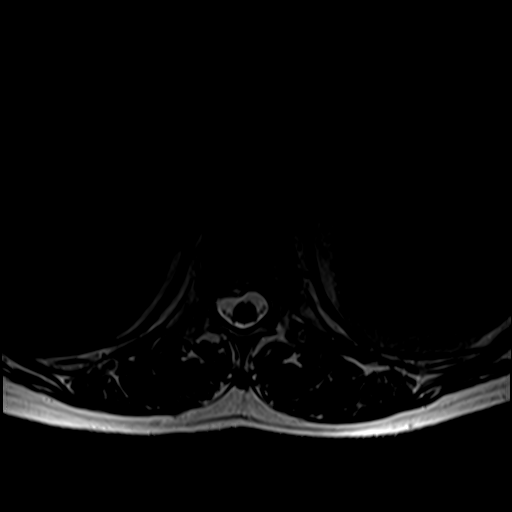
[im 53/53]
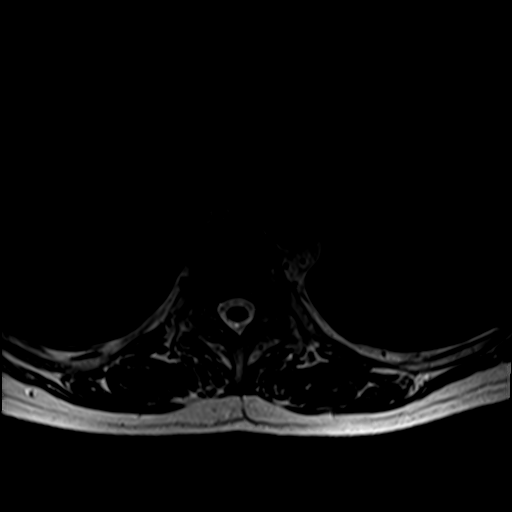

[Series 7: T1 · axial · 4.0mm · 0.39mm/px · z∈[-97,+119]mm · 5 of 53 slices shown (2 of 2)]
[im 1/53]
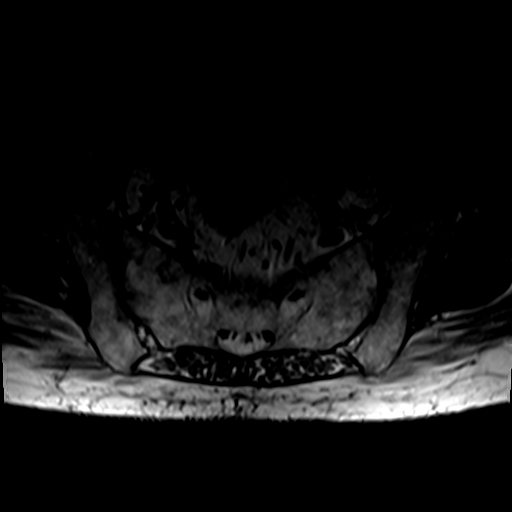
[im 8/53]
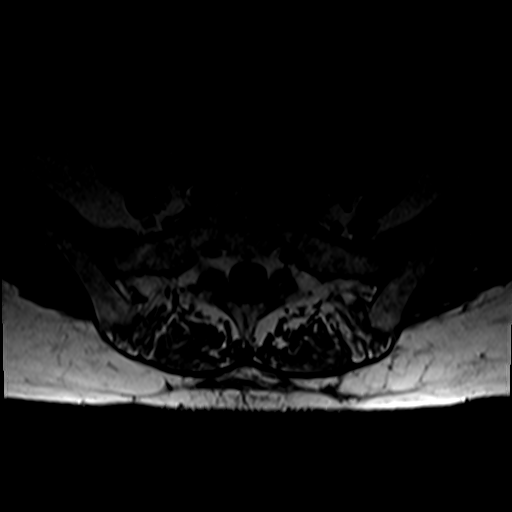
[im 15/53]
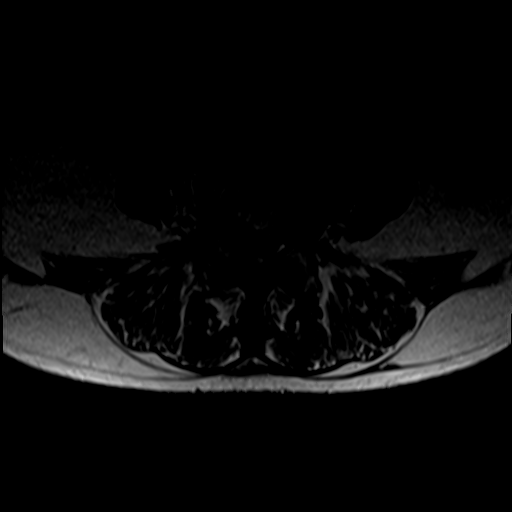
[im 27/53]
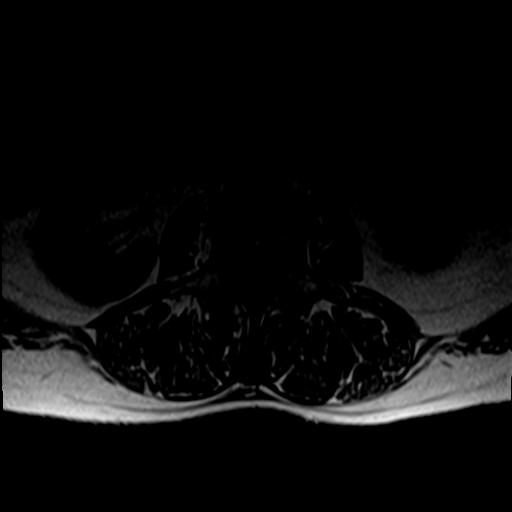
[im 45/53]
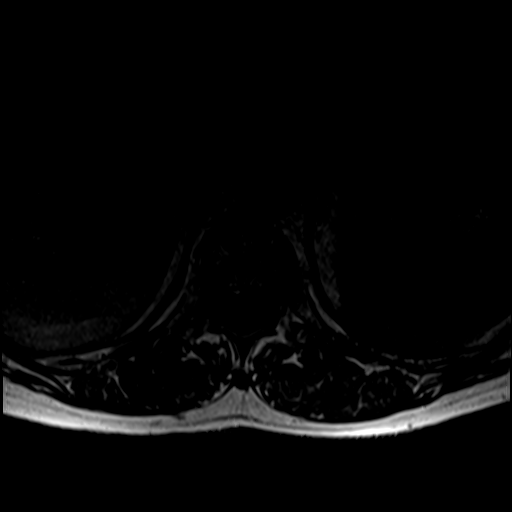

[26 of 48 positions shown; findings below may reference images not displayed]

FINDINGS: Segmentation:  5 lumbar vertebrae.  Rudimentary disc at S1-2.

Alignment: Mild lumbar levoscoliosis. New facet mediated grade 1
anterolisthesis of L4 on L5 measuring 3 mm.

Vertebrae: No fracture or suspicious marrow lesion. Degenerative
endplate changes at L5-S1 including mild left-sided degenerative
edema.

Conus medullaris and cauda equina: Conus extends to the L1-2 level.
Conus and cauda equina appear normal.

Paraspinal and other soft tissues: Small T2 hyperintensities in both
kidneys measuring up to 1.8 cm in size, likely cysts and for which
no routine follow-up imaging is recommended. Partially visualized
3.3 cm right adnexal cyst, not adequately characterized due to
incomplete coverage but seemingly unchanged in size from the
05/03/2021 CT and slightly smaller than in 4049 where it measured
3.6 cm.

Disc levels:

Disc desiccation throughout the lumbar and included lower thoracic
spine. Mild disc space narrowing at L3-4 and moderate narrowing at
L4-5 and L5-S1.

T11-12: Moderate-sized right central disc extrusion, larger than the
small protrusion on the 3644 MRI and resulting in mild spinal
stenosis with minimal ventral cord flattening. Patent neural
foramina.

T12-L1: Chronic, largely calcified right paracentral disc protrusion
without significant stenosis, similar to the prior MRI.

L1-2: Mild facet hypertrophy without disc herniation or stenosis.

L2-3: Mild disc bulging and mild facet hypertrophy without stenosis.

L3-4: Disc bulging and mild facet hypertrophy have progressed from
the prior MRI and result in mild bilateral lateral recess stenosis
without spinal or neural foraminal stenosis.

L4-5: Anterolisthesis with bulging uncovered disc and severe facet
and ligamentum flavum hypertrophy result in new severe spinal
stenosis and mild-to-moderate bilateral neural foraminal stenosis.
Potential bilateral L5 nerve root impingement.

L5-S1: Disc bulging, increased size of a central to left foraminal
disc protrusion, and moderate facet and ligamentum flavum
hypertrophy result in increased, mild right and severe left lateral
recess stenosis, mild to moderate spinal stenosis, and mild right
and moderate left neural foraminal stenosis. Left S1 nerve root
impingement in the lateral recess.
IMPRESSION: 1. Progressive lumbar disc and facet degeneration since 3644, most
notable at L4-5 where there is new grade 1 anterolisthesis, severe
spinal stenosis, and mild-to-moderate neural foraminal stenosis.
2. Moderate left neural foraminal and severe left lateral recess
stenosis at L5-S1.
3. Mild lateral recess stenosis at L3-4.
4. 3.3 cm right adnexal simple-appearing cyst, not adequately
characterized although slightly smaller than in 3644 and most likely
benign. Recommend prompt follow-up with pelvic US if this has not
previously been performed elsewhere. Reference: [id number]

## 2022-06-21 DIAGNOSIS — M4316 Spondylolisthesis, lumbar region: Secondary | ICD-10-CM | POA: Diagnosis not present

## 2022-06-23 NOTE — Pre-Procedure Instructions (Signed)
Surgical Instructions    Your procedure is scheduled on July 01, 2022.  Report to [facility] Main Entrance "A" at 9:15 A.M., then check in with the Admitting office.  Call this number if you have problems the morning of surgery:  [phone]   If you have any questions prior to your surgery date call [phone]: Open Monday-Friday 8am-4pm    Remember:  Do not eat or drink after midnight the night before your surgery     Take these medicines the morning of surgery with A SIP OF WATER:  amLODipine (NORVASC)   metoprolol succinate (TOPROL-XL)  omeprazole (PRILOSEC)  fenofibrate   venlafaxine (EFFEXOR)   triamcinolone (NASACORT) - take as needed morning of surgery  As of today, STOP taking any Aspirin (unless otherwise instructed by your surgeon) Aleve, Naproxen, Ibuprofen, Motrin, Advil, Goody's, BC's, all herbal medications, fish oil, and all vitamins.   WHAT DO I DO ABOUT MY DIABETES MEDICATION?   Do not take pioglitazone (ACTOS) the morning of surgery.   HOW TO MANAGE YOUR DIABETES BEFORE AND AFTER SURGERY  Why is it important to control my blood sugar before and after surgery? Improving blood sugar levels before and after surgery helps healing and can limit problems. A way of improving blood sugar control is eating a healthy diet by:  Eating less sugar and carbohydrates  Increasing activity/exercise  Talking with your doctor about reaching your blood sugar goals High blood sugars (greater than 180 mg/dL) can raise your risk of infections and slow your recovery, so you will need to focus on controlling your diabetes during the weeks before surgery. Make sure that the doctor who takes care of your diabetes knows about your planned surgery including the date and location.  How do I manage my blood sugar before surgery? Check your blood sugar at least 4 times a day, starting 2 days before surgery, to make sure that the level is not too high or low.  Check your blood  sugar the morning of your surgery when you wake up and every 2 hours until you get to the Short Stay unit.  If your blood sugar is less than 70 mg/dL, you will need to treat for low blood sugar: Do not take insulin. Treat a low blood sugar (less than 70 mg/dL) with  cup of clear juice (cranberry or apple), 4 glucose tablets, OR glucose gel. Recheck blood sugar in 15 minutes after treatment (to make sure it is greater than 70 mg/dL). If your blood sugar is not greater than 70 mg/dL on recheck, call [phone] for further instructions. Report your blood sugar to the short stay nurse when you get to Short Stay.  If you are admitted to the hospital after surgery: Your blood sugar will be checked by the staff and you will probably be given insulin after surgery (instead of oral diabetes medicines) to make sure you have good blood sugar levels. The goal for blood sugar control after surgery is 80-180 mg/dL.                       Do NOT Smoke (Tobacco/Vaping) for 24 hours prior to your procedure.  If you use a CPAP at night, you may bring your mask/headgear for your overnight stay.   Contacts, glasses, piercing's, hearing aid's, dentures or partials may not be worn into surgery, please bring cases for these belongings.    For patients admitted to the hospital, discharge time will be determined by your treatment  team.   Patients discharged the day of surgery will not be allowed to drive home, and someone needs to stay with them for 24 hours.  SURGICAL WAITING ROOM VISITATION Patients having surgery or a procedure may have two support people in the waiting room. These visitors may be switched out with other visitors if needed. Children under the age of 40 must have an adult accompany them who is not the patient. If the patient needs to stay at the hospital during part of their recovery, the visitor guidelines for inpatient rooms apply.  Please refer to the [facility] website for the visitor  guidelines for Inpatients (after your surgery is over and you are in a regular room).    Special instructions:   [facility]- Preparing For Surgery  Before surgery, you can play an important role. Because skin is not sterile, your skin needs to be as free of germs as possible. You can reduce the number of germs on your skin by washing with CHG (chlorahexidine gluconate) Soap before surgery.  CHG is an antiseptic cleaner which kills germs and bonds with the skin to continue killing germs even after washing.    Oral Hygiene is also important to reduce your risk of infection.  Remember - BRUSH YOUR TEETH THE MORNING OF SURGERY WITH YOUR REGULAR TOOTHPASTE  Please do not use if you have an allergy to CHG or antibacterial soaps. If your skin becomes reddened/irritated stop using the CHG.  Do not shave (including legs and underarms) for at least 48 hours prior to first CHG shower. It is OK to shave your face.  Please follow these instructions carefully.   Shower the NIGHT BEFORE SURGERY and the MORNING OF SURGERY  If you chose to wash your hair, wash your hair first as usual with your normal shampoo.  After you shampoo, rinse your hair and body thoroughly to remove the shampoo.  Use CHG Soap as you would any other liquid soap. You can apply CHG directly to the skin and wash gently with a scrungie or a clean washcloth.   Apply the CHG Soap to your body ONLY FROM THE NECK DOWN.  Do not use on open wounds or open sores. Avoid contact with your eyes, ears, mouth and genitals (private parts). Wash Face and genitals (private parts)  with your normal soap.   Wash thoroughly, paying special attention to the area where your surgery will be performed.  Thoroughly rinse your body with warm water from the neck down.  DO NOT shower/wash with your normal soap after using and rinsing off the CHG Soap.  Pat yourself dry with a CLEAN TOWEL.  Wear CLEAN PAJAMAS to bed the night before surgery  Place CLEAN  SHEETS on your bed the night before your surgery  DO NOT SLEEP WITH PETS.   Day of Surgery: Take a shower with CHG soap. Do not wear jewelry or makeup Do not wear lotions, powders, perfumes/colognes, or deodorant. Do not shave 48 hours prior to surgery.   Do not bring valuables to the hospital.  [facility] is not responsible for any belongings or valuables. Do not wear nail polish, gel polish, artificial nails, or any other type of covering on natural nails (fingers and toes) If you have artificial nails or gel coating that need to be removed by a nail salon, please have this removed prior to surgery. Artificial nails or gel coating may interfere with anesthesia's ability to adequately monitor your vital signs. Wear Clean/Comfortable clothing the morning  of surgery Remember to brush your teeth WITH YOUR REGULAR TOOTHPASTE.   Please read over the following fact sheets that you were given.    If you received a COVID test during your pre-op visit  it is requested that you wear a mask when out in public, stay away from anyone that may not be feeling well and notify your surgeon if you develop symptoms. If you have been in contact with anyone that has tested positive in the last 10 days please notify you surgeon.

## 2022-06-24 ENCOUNTER — Encounter (HOSPITAL_COMMUNITY): Payer: Self-pay

## 2022-06-24 ENCOUNTER — Encounter (HOSPITAL_COMMUNITY)
Admission: RE | Admit: 2022-06-24 | Discharge: 2022-06-24 | Disposition: A | Discharge status: Home / Self Care | Payer: Medicare PPO | Source: Ambulatory Visit | Attending: Neurosurgery | Admitting: Neurosurgery

## 2022-06-24 ENCOUNTER — Other Ambulatory Visit: Payer: Self-pay

## 2022-06-24 VITALS — BP 145/68 | HR 62 | Temp 97.9°F | Resp 16 | Ht 59.0 in | Wt 122.0 lb

## 2022-06-24 DIAGNOSIS — Z01818 Encounter for other preprocedural examination: Secondary | ICD-10-CM | POA: Diagnosis not present

## 2022-06-24 LAB — CBC
HCT: 39.9 % (ref 36.0–46.0)
Hemoglobin: 12.2 g/dL (ref 12.0–15.0)
MCH: 29.5 pg (ref 26.0–34.0)
MCHC: 30.6 g/dL (ref 30.0–36.0)
MCV: 96.6 fL (ref 80.0–100.0)
Platelets: 260 10*3/uL (ref 150–400)
RBC: 4.13 MIL/uL (ref 3.87–5.11)
RDW: 15.9 % — ABNORMAL HIGH (ref 11.5–15.5)
WBC: 12.1 10*3/uL — ABNORMAL HIGH (ref 4.0–10.5)
nRBC: 0 % (ref 0.0–0.2)

## 2022-06-24 LAB — TYPE AND SCREEN
ABO/RH(D): O POS
Antibody Screen: NEGATIVE

## 2022-06-24 LAB — BASIC METABOLIC PANEL
Anion gap: 11 (ref 5–15)
BUN: 25 mg/dL — ABNORMAL HIGH (ref 8–23)
CO2: 23 mmol/L (ref 22–32)
Calcium: 8.8 mg/dL — ABNORMAL LOW (ref 8.9–10.3)
Chloride: 102 mmol/L (ref 98–111)
Creatinine, Ser: 1.35 mg/dL — ABNORMAL HIGH (ref 0.44–1.00)
GFR, Estimated: 41 mL/min — ABNORMAL LOW (ref >60)
Glucose, Bld: 141 mg/dL — ABNORMAL HIGH (ref 70–99)
Potassium: 4.2 mmol/L (ref 3.5–5.1)
Sodium: 136 mmol/L (ref 135–145)

## 2022-06-24 LAB — SURGICAL PCR SCREEN
MRSA, PCR: NEGATIVE
Staphylococcus aureus: NEGATIVE

## 2022-06-24 LAB — GLUCOSE, CAPILLARY: Glucose-Capillary: 138 mg/dL — ABNORMAL HIGH (ref 70–99)

## 2022-06-24 NOTE — Progress Notes (Signed)
PCP - [name] MD Cardiologist - Denies  PPM/ICD -denies Device Orders -  Rep Notified -   Chest x-ray - 05/12/21 EKG - 06/24/22 Stress Test - Pt states she had one years ago but no results found in chart review or care everywhere ECHO - denies Cardiac Cath - denies  Sleep Study - none CPAP - no  Fasting Blood Sugar - pt does not know;she does not check her sugar at home and does not have a glucometer.  Checks Blood Sugar times a day-none  Blood Thinner Instructions:none Aspirin Instructions:none  ERAS Protcol -no PRE-SURGERY Ensure or G2-   COVID TEST- na   Anesthesia review: no  Patient denies shortness of breath, fever, cough and chest pain at PAT appointment   All instructions explained to the patient, with a verbal understanding of the material. Patient agrees to go over the instructions while at home for a better understanding. Patient also instructed to wear a mask when out in public prior to surgery. The opportunity to ask questions was provided.

## 2022-06-25 LAB — HEMOGLOBIN A1C
Hgb A1c MFr Bld: 6.6 % — ABNORMAL HIGH (ref 4.8–5.6)
Mean Plasma Glucose: 143 mg/dL

## 2022-07-01 ENCOUNTER — Ambulatory Visit (HOSPITAL_COMMUNITY): Payer: Medicare PPO | Admitting: Emergency Medicine

## 2022-07-01 ENCOUNTER — Ambulatory Visit (HOSPITAL_COMMUNITY)
Admission: RE | Admit: 2022-07-01 | Discharge: 2022-07-02 | Disposition: A | Discharge status: Home / Self Care | Payer: Medicare PPO | Attending: Neurosurgery | Admitting: Neurosurgery

## 2022-07-01 ENCOUNTER — Encounter (HOSPITAL_COMMUNITY)
Admission: RE | Disposition: A | Discharge status: Home / Self Care | Payer: Self-pay | Source: Home / Self Care | Attending: Neurosurgery

## 2022-07-01 ENCOUNTER — Ambulatory Visit (HOSPITAL_COMMUNITY): Payer: Medicare PPO

## 2022-07-01 ENCOUNTER — Ambulatory Visit (HOSPITAL_BASED_OUTPATIENT_CLINIC_OR_DEPARTMENT_OTHER): Payer: Medicare PPO | Admitting: Certified Registered Nurse Anesthetist

## 2022-07-01 ENCOUNTER — Other Ambulatory Visit: Payer: Self-pay

## 2022-07-01 ENCOUNTER — Encounter (HOSPITAL_COMMUNITY): Payer: Self-pay | Admitting: Neurosurgery

## 2022-07-01 DIAGNOSIS — Z7984 Long term (current) use of oral hypoglycemic drugs: Secondary | ICD-10-CM | POA: Diagnosis not present

## 2022-07-01 DIAGNOSIS — E119 Type 2 diabetes mellitus without complications: Secondary | ICD-10-CM | POA: Insufficient documentation

## 2022-07-01 DIAGNOSIS — K219 Gastro-esophageal reflux disease without esophagitis: Secondary | ICD-10-CM | POA: Insufficient documentation

## 2022-07-01 DIAGNOSIS — M1990 Unspecified osteoarthritis, unspecified site: Secondary | ICD-10-CM | POA: Insufficient documentation

## 2022-07-01 DIAGNOSIS — M4316 Spondylolisthesis, lumbar region: Secondary | ICD-10-CM | POA: Insufficient documentation

## 2022-07-01 DIAGNOSIS — M5117 Intervertebral disc disorders with radiculopathy, lumbosacral region: Secondary | ICD-10-CM | POA: Insufficient documentation

## 2022-07-01 DIAGNOSIS — M5116 Intervertebral disc disorders with radiculopathy, lumbar region: Secondary | ICD-10-CM | POA: Insufficient documentation

## 2022-07-01 DIAGNOSIS — M48061 Spinal stenosis, lumbar region without neurogenic claudication: Secondary | ICD-10-CM

## 2022-07-01 DIAGNOSIS — I10 Essential (primary) hypertension: Secondary | ICD-10-CM | POA: Diagnosis not present

## 2022-07-01 DIAGNOSIS — Z01818 Encounter for other preprocedural examination: Secondary | ICD-10-CM

## 2022-07-01 DIAGNOSIS — Z79899 Other long term (current) drug therapy: Secondary | ICD-10-CM | POA: Insufficient documentation

## 2022-07-01 DIAGNOSIS — K5000 Crohn's disease of small intestine without complications: Secondary | ICD-10-CM

## 2022-07-01 DIAGNOSIS — M4326 Fusion of spine, lumbar region: Secondary | ICD-10-CM | POA: Diagnosis not present

## 2022-07-01 LAB — GLUCOSE, CAPILLARY
Glucose-Capillary: 150 mg/dL — ABNORMAL HIGH (ref 70–99)
Glucose-Capillary: 234 mg/dL — ABNORMAL HIGH (ref 70–99)
Glucose-Capillary: 267 mg/dL — ABNORMAL HIGH (ref 70–99)
Glucose-Capillary: 241 mg/dL — ABNORMAL HIGH (ref 70–99)

## 2022-07-01 LAB — ABO/RH: ABO/RH(D): O POS

## 2022-07-01 SURGERY — POSTERIOR LUMBAR FUSION 2 LEVEL
Anesthesia: General | Site: Back

## 2022-07-01 MED ORDER — PROPOFOL 10 MG/ML IV BOLUS
INTRAVENOUS | Status: AC
Start: 2022-07-01 — End: ?
  Filled 2022-07-01: qty 20

## 2022-07-01 MED ORDER — ROCURONIUM BROMIDE 10 MG/ML (PF) SYRINGE
PREFILLED_SYRINGE | INTRAVENOUS | Status: AC
  Filled 2022-07-01: qty 10

## 2022-07-01 MED ORDER — PANTOPRAZOLE SODIUM 40 MG PO TBEC: 40.0000 mg | DELAYED_RELEASE_TABLET | Freq: Every day | ORAL | Status: DC

## 2022-07-01 MED ORDER — ROCURONIUM BROMIDE 10 MG/ML (PF) SYRINGE
PREFILLED_SYRINGE | INTRAVENOUS | Status: DC | PRN
  Administered 2022-07-01 (×2): 20 mg via INTRAVENOUS
  Administered 2022-07-01: 50 mg via INTRAVENOUS

## 2022-07-01 MED ORDER — RISAQUAD PO CAPS: 1.0000 | ORAL_CAPSULE | Freq: Every day | ORAL | Status: DC | PRN

## 2022-07-01 MED ORDER — ALUM & MAG HYDROXIDE-SIMETH 200-200-20 MG/5ML PO SUSP: 30.0000 mL | Freq: Four times a day (QID) | ORAL | Status: DC | PRN

## 2022-07-01 MED ORDER — PHENYLEPHRINE 80 MCG/ML (10ML) SYRINGE FOR IV PUSH (FOR BLOOD PRESSURE SUPPORT)
PREFILLED_SYRINGE | INTRAVENOUS | Status: DC | PRN
  Administered 2022-07-01: 160 ug via INTRAVENOUS

## 2022-07-01 MED ORDER — BUPIVACAINE LIPOSOME 1.3 % IJ SUSP
INTRAMUSCULAR | Status: AC
  Filled 2022-07-01: qty 20

## 2022-07-01 MED ORDER — VANCOMYCIN HCL IN DEXTROSE 1-5 GM/200ML-% IV SOLN
INTRAVENOUS | Status: AC
  Administered 2022-07-01: 1000 mg via INTRAVENOUS
  Filled 2022-07-01: qty 200

## 2022-07-01 MED ORDER — CHLORHEXIDINE GLUCONATE CLOTH 2 % EX PADS: 6.0000 | MEDICATED_PAD | Freq: Once | CUTANEOUS | Status: DC

## 2022-07-01 MED ORDER — PHENYLEPHRINE HCL-NACL 20-0.9 MG/250ML-% IV SOLN
INTRAVENOUS | Status: DC | PRN
  Administered 2022-07-01: 15 ug/min via INTRAVENOUS

## 2022-07-01 MED ORDER — OCUVITE-LUTEIN PO CAPS: 1.0000 | ORAL_CAPSULE | Freq: Every day | ORAL | Status: DC

## 2022-07-01 MED ORDER — ONDANSETRON HCL 4 MG PO TABS: 4.0000 mg | ORAL_TABLET | Freq: Four times a day (QID) | ORAL | Status: DC | PRN

## 2022-07-01 MED ORDER — HYDROCODONE-ACETAMINOPHEN 5-325 MG PO TABS
2.0000 | ORAL_TABLET | ORAL | Status: DC | PRN
  Administered 2022-07-01 – 2022-07-02 (×4): 2 via ORAL
  Filled 2022-07-01 (×4): qty 2

## 2022-07-01 MED ORDER — TRIAMCINOLONE ACETONIDE 55 MCG/ACT NA AERO: 1.0000 | INHALATION_SPRAY | Freq: Every day | NASAL | Status: DC | PRN

## 2022-07-01 MED ORDER — VENLAFAXINE HCL 75 MG PO TABS
150.0000 mg | ORAL_TABLET | Freq: Two times a day (BID) | ORAL | Status: DC
  Administered 2022-07-01 – 2022-07-02 (×2): 150 mg via ORAL
  Filled 2022-07-01 (×2): qty 2

## 2022-07-01 MED ORDER — SODIUM CHLORIDE 0.9% FLUSH: 3.0000 mL | INTRAVENOUS | Status: DC | PRN

## 2022-07-01 MED ORDER — ACETAMINOPHEN 325 MG PO TABS: 650.0000 mg | ORAL_TABLET | ORAL | Status: DC | PRN

## 2022-07-01 MED ORDER — PANTOPRAZOLE SODIUM 40 MG PO TBEC
40.0000 mg | DELAYED_RELEASE_TABLET | Freq: Every day | ORAL | Status: DC
  Administered 2022-07-02: 40 mg via ORAL
  Filled 2022-07-01: qty 1

## 2022-07-01 MED ORDER — SODIUM CHLORIDE 0.9 % IV SOLN
250.0000 mL | INTRAVENOUS | Status: DC
  Administered 2022-07-01: 250 mL via INTRAVENOUS

## 2022-07-01 MED ORDER — SODIUM CHLORIDE 0.9% FLUSH
3.0000 mL | Freq: Two times a day (BID) | INTRAVENOUS | Status: DC
Start: 2022-07-01 — End: 2022-07-02
  Administered 2022-07-01 (×2): 3 mL via INTRAVENOUS

## 2022-07-01 MED ORDER — ONDANSETRON HCL 4 MG/2ML IJ SOLN
INTRAMUSCULAR | Status: DC | PRN
  Administered 2022-07-01: 4 mg via INTRAVENOUS

## 2022-07-01 MED ORDER — LOSARTAN POTASSIUM 50 MG PO TABS: 100.0000 mg | ORAL_TABLET | Freq: Every day | ORAL | Status: DC

## 2022-07-01 MED ORDER — MIDAZOLAM HCL 5 MG/5ML IJ SOLN
INTRAMUSCULAR | Status: DC | PRN
  Administered 2022-07-01 (×2): 1 mg via INTRAVENOUS

## 2022-07-01 MED ORDER — PANTOPRAZOLE SODIUM 40 MG IV SOLR: 40.0000 mg | Freq: Every day | INTRAVENOUS | Status: DC

## 2022-07-01 MED ORDER — 0.9 % SODIUM CHLORIDE (POUR BTL) OPTIME
TOPICAL | Status: DC | PRN
  Administered 2022-07-01: 1000 mL

## 2022-07-01 MED ORDER — CEFAZOLIN SODIUM-DEXTROSE 2-4 GM/100ML-% IV SOLN
2.0000 g | INTRAVENOUS | Status: AC
  Administered 2022-07-02: 2 g via INTRAVENOUS
  Filled 2022-07-01: qty 100

## 2022-07-01 MED ORDER — PIOGLITAZONE HCL 15 MG PO TABS
15.0000 mg | ORAL_TABLET | Freq: Every morning | ORAL | Status: DC
  Administered 2022-07-02: 15 mg via ORAL
  Filled 2022-07-01: qty 1

## 2022-07-01 MED ORDER — PHENYLEPHRINE HCL (PRESSORS) 10 MG/ML IV SOLN
INTRAVENOUS | Status: AC
  Filled 2022-07-01: qty 1

## 2022-07-01 MED ORDER — SUGAMMADEX SODIUM 200 MG/2ML IV SOLN
INTRAVENOUS | Status: DC | PRN
  Administered 2022-07-01: 200 mg via INTRAVENOUS

## 2022-07-01 MED ORDER — INSULIN ASPART 100 UNIT/ML IJ SOLN: 0.0000 [IU] | INTRAMUSCULAR | Status: DC | PRN

## 2022-07-01 MED ORDER — INSULIN ASPART 100 UNIT/ML IJ SOLN
0.0000 [IU] | Freq: Every day | INTRAMUSCULAR | Status: DC
  Administered 2022-07-01: 2 [IU] via SUBCUTANEOUS

## 2022-07-01 MED ORDER — VANCOMYCIN HCL IN DEXTROSE 1-5 GM/200ML-% IV SOLN: 1000.0000 mg | INTRAVENOUS | Status: AC

## 2022-07-01 MED ORDER — PROSIGHT PO TABS
1.0000 | ORAL_TABLET | Freq: Every day | ORAL | Status: DC
Start: 2022-07-02 — End: 2022-07-02
  Administered 2022-07-02: 1 via ORAL
  Filled 2022-07-01: qty 1

## 2022-07-01 MED ORDER — PROPOFOL 10 MG/ML IV BOLUS
INTRAVENOUS | Status: DC | PRN
  Administered 2022-07-01: 130 mg via INTRAVENOUS

## 2022-07-01 MED ORDER — PHENYLEPHRINE HCL (PRESSORS) 10 MG/ML IV SOLN
INTRAVENOUS | Status: AC
Start: 2022-07-01 — End: ?
  Filled 2022-07-01: qty 1

## 2022-07-01 MED ORDER — LIDOCAINE-EPINEPHRINE 1 %-1:100000 IJ SOLN
INTRAMUSCULAR | Status: AC
  Filled 2022-07-01: qty 1

## 2022-07-01 MED ORDER — LACTATED RINGERS IV SOLN: INTRAVENOUS | Status: DC

## 2022-07-01 MED ORDER — MIDAZOLAM HCL 2 MG/2ML IJ SOLN
INTRAMUSCULAR | Status: AC
  Filled 2022-07-01: qty 2

## 2022-07-01 MED ORDER — ACETAMINOPHEN 650 MG RE SUPP: 650.0000 mg | RECTAL | Status: DC | PRN

## 2022-07-01 MED ORDER — LIDOCAINE-EPINEPHRINE 1 %-1:100000 IJ SOLN
INTRAMUSCULAR | Status: DC | PRN
  Administered 2022-07-01: 10 mL

## 2022-07-01 MED ORDER — FENOFIBRATE 54 MG PO TABS
54.0000 mg | ORAL_TABLET | ORAL | Status: DC
  Administered 2022-07-02: 54 mg via ORAL
  Filled 2022-07-01: qty 1

## 2022-07-01 MED ORDER — LIDOCAINE 2% (20 MG/ML) 5 ML SYRINGE
INTRAMUSCULAR | Status: AC
  Filled 2022-07-01: qty 5

## 2022-07-01 MED ORDER — PRESERVISION AREDS PO CAPS: 1.0000 | ORAL_CAPSULE | Freq: Two times a day (BID) | ORAL | Status: DC

## 2022-07-01 MED ORDER — PHENYLEPHRINE HCL-NACL 20-0.9 MG/250ML-% IV SOLN: INTRAVENOUS | Status: DC | PRN

## 2022-07-01 MED ORDER — AMLODIPINE BESYLATE 5 MG PO TABS
5.0000 mg | ORAL_TABLET | Freq: Every evening | ORAL | Status: DC
  Administered 2022-07-01: 5 mg via ORAL
  Filled 2022-07-01: qty 1

## 2022-07-01 MED ORDER — INSULIN ASPART 100 UNIT/ML IJ SOLN
0.0000 [IU] | Freq: Three times a day (TID) | INTRAMUSCULAR | Status: DC
  Administered 2022-07-01: 8 [IU] via SUBCUTANEOUS
  Administered 2022-07-02: 3 [IU] via SUBCUTANEOUS

## 2022-07-01 MED ORDER — FENTANYL CITRATE (PF) 250 MCG/5ML IJ SOLN
INTRAMUSCULAR | Status: AC
  Filled 2022-07-01: qty 5

## 2022-07-01 MED ORDER — HYDROMORPHONE HCL 1 MG/ML IJ SOLN: 0.5000 mg | INTRAMUSCULAR | Status: DC | PRN

## 2022-07-01 MED ORDER — METOPROLOL SUCCINATE ER 50 MG PO TB24
50.0000 mg | ORAL_TABLET | Freq: Two times a day (BID) | ORAL | Status: DC
  Administered 2022-07-02: 50 mg via ORAL
  Filled 2022-07-01 (×2): qty 1

## 2022-07-01 MED ORDER — ONDANSETRON HCL 4 MG/2ML IJ SOLN: 4.0000 mg | Freq: Four times a day (QID) | INTRAMUSCULAR | Status: DC | PRN

## 2022-07-01 MED ORDER — CYCLOBENZAPRINE HCL 10 MG PO TABS
10.0000 mg | ORAL_TABLET | Freq: Three times a day (TID) | ORAL | Status: DC | PRN
  Administered 2022-07-02: 10 mg via ORAL
  Filled 2022-07-01: qty 1

## 2022-07-01 MED ORDER — PHENOL 1.4 % MT LIQD: 1.0000 | OROMUCOSAL | Status: DC | PRN

## 2022-07-01 MED ORDER — HYDROMORPHONE HCL 1 MG/ML IJ SOLN
INTRAMUSCULAR | Status: AC
  Filled 2022-07-01: qty 1

## 2022-07-01 MED ORDER — DEXAMETHASONE SODIUM PHOSPHATE 10 MG/ML IJ SOLN
INTRAMUSCULAR | Status: AC
  Filled 2022-07-01: qty 1

## 2022-07-01 MED ORDER — EPHEDRINE SULFATE-NACL 50-0.9 MG/10ML-% IV SOSY
PREFILLED_SYRINGE | INTRAVENOUS | Status: DC | PRN
  Administered 2022-07-01 (×2): 10 mg via INTRAVENOUS
  Administered 2022-07-01: 5 mg via INTRAVENOUS

## 2022-07-01 MED ORDER — ORAL CARE MOUTH RINSE: 15.0000 mL | Freq: Once | OROMUCOSAL | Status: AC

## 2022-07-01 MED ORDER — INSULIN ASPART 100 UNIT/ML IJ SOLN: 0.0000 [IU] | Freq: Three times a day (TID) | INTRAMUSCULAR | Status: DC

## 2022-07-01 MED ORDER — VITAMIN E 400 UNITS PO TABS
400.0000 [IU] | ORAL_TABLET | Freq: Two times a day (BID) | ORAL | Status: DC
  Filled 2022-07-01: qty 1

## 2022-07-01 MED ORDER — MENTHOL 3 MG MT LOZG: 1.0000 | LOZENGE | OROMUCOSAL | Status: DC | PRN

## 2022-07-01 MED ORDER — PROBIOTIC PO TBEC: DELAYED_RELEASE_TABLET | Freq: Every day | ORAL | Status: DC | PRN

## 2022-07-01 MED ORDER — ONDANSETRON HCL 4 MG/2ML IJ SOLN
INTRAMUSCULAR | Status: AC
  Filled 2022-07-01: qty 2

## 2022-07-01 MED ORDER — DEXAMETHASONE SODIUM PHOSPHATE 10 MG/ML IJ SOLN
INTRAMUSCULAR | Status: DC | PRN
  Administered 2022-07-01: 10 mg via INTRAVENOUS

## 2022-07-01 MED ORDER — CHLORHEXIDINE GLUCONATE 0.12 % MT SOLN
15.0000 mL | Freq: Once | OROMUCOSAL | Status: AC
  Administered 2022-07-01: 15 mL via OROMUCOSAL
  Filled 2022-07-01: qty 15

## 2022-07-01 MED ORDER — SUCCINYLCHOLINE CHLORIDE 200 MG/10ML IV SOSY
PREFILLED_SYRINGE | INTRAVENOUS | Status: DC | PRN
  Administered 2022-07-01: 120 mg via INTRAVENOUS

## 2022-07-01 MED ORDER — FENTANYL CITRATE (PF) 250 MCG/5ML IJ SOLN
INTRAMUSCULAR | Status: DC | PRN
  Administered 2022-07-01: 50 ug via INTRAVENOUS

## 2022-07-01 MED ORDER — THROMBIN 20000 UNITS EX SOLR
CUTANEOUS | Status: AC
  Filled 2022-07-01: qty 20000

## 2022-07-01 MED ORDER — BUPIVACAINE LIPOSOME 1.3 % IJ SUSP
INTRAMUSCULAR | Status: DC | PRN
  Administered 2022-07-01: 20 mL

## 2022-07-01 MED ORDER — CYANOCOBALAMIN 1000 MCG/ML IJ SOLN
1500.0000 ug | INTRAMUSCULAR | Status: DC
  Filled 2022-07-01: qty 1.5

## 2022-07-01 MED ORDER — LIDOCAINE 2% (20 MG/ML) 5 ML SYRINGE
INTRAMUSCULAR | Status: DC | PRN
  Administered 2022-07-01: 50 mg via INTRAVENOUS

## 2022-07-01 MED ORDER — HYDROMORPHONE HCL 1 MG/ML IJ SOLN
0.2500 mg | INTRAMUSCULAR | Status: DC | PRN
  Administered 2022-07-01: 0.25 mg via INTRAVENOUS

## 2022-07-01 MED ORDER — THROMBIN 20000 UNITS EX SOLR: CUTANEOUS | Status: DC | PRN

## 2022-07-01 SURGICAL SUPPLY — 75 items
BAG COUNTER SPONGE SURGICOUNT (BAG) ×3 IMPLANT
BASKET BONE COLLECTION (BASKET) ×2 IMPLANT
BENZOIN TINCTURE PRP APPL 2/3 (GAUZE/BANDAGES/DRESSINGS) ×2 IMPLANT
BIT DRILL 5.0/4.0 (BIT) IMPLANT
BLADE BONE MILL MEDIUM (MISCELLANEOUS) ×2 IMPLANT
BLADE SURG 11 STRL SS (BLADE) ×2 IMPLANT
BONE VIVIGEN FORMABLE 5.4CC (Bone Implant) ×2 IMPLANT
BUR CUTTER 7.0 ROUND (BURR) ×2 IMPLANT
BUR MATCHSTICK NEURO 3.0 LAGG (BURR) ×2 IMPLANT
CANISTER SUCT 3000ML PPV (MISCELLANEOUS) ×2 IMPLANT
CAP LOCKING THREADED (Cap) ×6 IMPLANT
CARTRIDGE OIL MAESTRO DRILL (MISCELLANEOUS) ×1 IMPLANT
CNTNR URN SCR LID CUP LEK RST (MISCELLANEOUS) ×1 IMPLANT
CONT SPEC 4OZ STRL OR WHT (MISCELLANEOUS) ×2
COVER BACK TABLE 60X90IN (DRAPES) ×2 IMPLANT
DERMABOND ADVANCED (GAUZE/BANDAGES/DRESSINGS) ×1
DERMABOND ADVANCED .7 DNX12 (GAUZE/BANDAGES/DRESSINGS) ×1 IMPLANT
DIFFUSER DRILL AIR PNEUMATIC (MISCELLANEOUS) ×2 IMPLANT
DRAPE C-ARM 42X72 X-RAY (DRAPES) ×3 IMPLANT
DRAPE LAPAROTOMY 100X72X124 (DRAPES) ×2 IMPLANT
DRILL 5.0/4.0 (BIT) ×2
DRSG OPSITE 4X5.5 SM (GAUZE/BANDAGES/DRESSINGS) ×2 IMPLANT
DRSG OPSITE POSTOP 4X6 (GAUZE/BANDAGES/DRESSINGS) ×2 IMPLANT
DURAPREP 26ML APPLICATOR (WOUND CARE) ×2 IMPLANT
ELECT REM PT RETURN 9FT ADLT (ELECTROSURGICAL) ×2
ELECTRODE REM PT RTRN 9FT ADLT (ELECTROSURGICAL) ×1 IMPLANT
EVACUATOR 1/8 PVC DRAIN (DRAIN) ×1 IMPLANT
EVACUATOR 3/16  PVC DRAIN (DRAIN)
EVACUATOR 3/16 PVC DRAIN (DRAIN) ×1 IMPLANT
GAUZE SPONGE 4X4 12PLY STRL (GAUZE/BANDAGES/DRESSINGS) ×2 IMPLANT
GLOVE BIO SURGEON STRL SZ7 (GLOVE) ×1 IMPLANT
GLOVE BIO SURGEON STRL SZ8 (GLOVE) ×4 IMPLANT
GLOVE BIOGEL PI IND STRL 6.5 (GLOVE) IMPLANT
GLOVE BIOGEL PI IND STRL 7.0 (GLOVE) IMPLANT
GLOVE BIOGEL PI IND STRL 7.5 (GLOVE) IMPLANT
GLOVE BIOGEL PI INDICATOR 6.5 (GLOVE) ×3
GLOVE BIOGEL PI INDICATOR 7.0 (GLOVE) ×1
GLOVE BIOGEL PI INDICATOR 7.5 (GLOVE) ×1
GLOVE ECLIPSE 7.5 STRL STRAW (GLOVE) ×1 IMPLANT
GLOVE INDICATOR 8.5 STRL (GLOVE) ×7 IMPLANT
GLOVE SURG SS PI 6.5 STRL IVOR (GLOVE) ×3 IMPLANT
GOWN STRL REUS W/ TWL LRG LVL3 (GOWN DISPOSABLE) IMPLANT
GOWN STRL REUS W/ TWL XL LVL3 (GOWN DISPOSABLE) ×2 IMPLANT
GOWN STRL REUS W/TWL 2XL LVL3 (GOWN DISPOSABLE) IMPLANT
GOWN STRL REUS W/TWL LRG LVL3 (GOWN DISPOSABLE) ×8
GOWN STRL REUS W/TWL XL LVL3 (GOWN DISPOSABLE) ×4
GRAFT BNE MATRIX VG FRMBL MD 5 (Bone Implant) IMPLANT
GRAFT BONE PROTEIOS LRG 5CC (Orthopedic Implant) ×1 IMPLANT
KIT BASIN OR (CUSTOM PROCEDURE TRAY) ×2 IMPLANT
KIT TURNOVER KIT B (KITS) ×2 IMPLANT
MILL BONE PREP (MISCELLANEOUS) ×2 IMPLANT
NDL HYPO 21X1.5 SAFETY (NEEDLE) ×1 IMPLANT
NDL HYPO 25X1 1.5 SAFETY (NEEDLE) ×1 IMPLANT
NEEDLE HYPO 21X1.5 SAFETY (NEEDLE) ×2 IMPLANT
NEEDLE HYPO 25X1 1.5 SAFETY (NEEDLE) ×2 IMPLANT
NS IRRIG 1000ML POUR BTL (IV SOLUTION) ×2 IMPLANT
OIL CARTRIDGE MAESTRO DRILL (MISCELLANEOUS) ×2
PACK LAMINECTOMY NEURO (CUSTOM PROCEDURE TRAY) ×2 IMPLANT
PAD ARMBOARD 7.5X6 YLW CONV (MISCELLANEOUS) ×6 IMPLANT
ROD CREO 60MM (Rod) ×2 IMPLANT
SCREW 6.5X5.5 30MM (Screw) ×2 IMPLANT
SHAFT CREO 30MM (Neuro Prosthesis/Implant) ×4 IMPLANT
SPACER SUSTAIN TI 8X26X11 8D (Spacer) ×4 IMPLANT
SPONGE SURGIFOAM ABS GEL 100 (HEMOSTASIS) ×2 IMPLANT
STRIP CLOSURE SKIN 1/2X4 (GAUZE/BANDAGES/DRESSINGS) ×3 IMPLANT
SUT VIC AB 0 CT1 18XCR BRD8 (SUTURE) ×1 IMPLANT
SUT VIC AB 0 CT1 8-18 (SUTURE) ×4
SUT VIC AB 2-0 CT1 18 (SUTURE) ×3 IMPLANT
SUT VIC AB 4-0 PS2 27 (SUTURE) ×2 IMPLANT
SYR 20ML LL LF (SYRINGE) ×2 IMPLANT
TOWEL GREEN STERILE (TOWEL DISPOSABLE) ×2 IMPLANT
TOWEL GREEN STERILE FF (TOWEL DISPOSABLE) ×2 IMPLANT
TRAY FOLEY MTR SLVR 16FR STAT (SET/KITS/TRAYS/PACK) ×2 IMPLANT
TULIP CREP AMP 5.5MM (Orthopedic Implant) ×6 IMPLANT
WATER STERILE IRR 1000ML POUR (IV SOLUTION) ×2 IMPLANT

## 2022-07-01 NOTE — Anesthesia Procedure Notes (Signed)
Procedure Name: Intubation Date/Time: 07/01/2022 8:39 AM  Performed by: Lowella Dell, CRNAPre-anesthesia Checklist: Patient identified, Emergency Drugs available, Suction available and Patient being monitored Patient Re-evaluated:Patient Re-evaluated prior to induction Oxygen Delivery Method: Circle System Utilized Preoxygenation: Pre-oxygenation with 100% oxygen Induction Type: IV induction and Rapid sequence Laryngoscope Size: Glidescope and 3 Grade View: Grade I Tube type: Oral Tube size: 6.5 mm Number of attempts: 1 Airway Equipment and Method: Rigid stylet and Video-laryngoscopy Placement Confirmation: ETT inserted through vocal cords under direct vision, positive ETCO2 and breath sounds checked- equal and bilateral Secured at: 20 cm Tube secured with: Tape Dental Injury: Teeth and Oropharynx as per pre-operative assessment  Difficulty Due To: Difficult Airway- due to reduced neck mobility Comments: Elective easy glidescope intubation due to limited neck ROM. Head/neck in neutral position throughout procedure.

## 2022-07-01 NOTE — Anesthesia Preprocedure Evaluation (Addendum)
Anesthesia Evaluation  Patient identified by MRN, date of birth, ID band Patient awake    Reviewed: Allergy & Precautions, NPO status , Patient's Chart, lab work & pertinent test results  Airway Mallampati: II  TM Distance: >3 FB     Dental   Pulmonary    breath sounds clear to auscultation       Cardiovascular hypertension,  Rhythm:Regular Rate:Normal     Neuro/Psych PSYCHIATRIC DISORDERS TIA   GI/Hepatic GERD  ,(+) Hepatitis -  Endo/Other  diabetes  Renal/GU Renal disease     Musculoskeletal  (+) Arthritis ,   Abdominal   Peds  Hematology   Anesthesia Other Findings   Reproductive/Obstetrics                             Anesthesia Physical Anesthesia Plan  ASA: 3  Anesthesia Plan: General   Post-op Pain Management:    Induction:   PONV Risk Score and Plan:   Airway Management Planned: Oral ETT  Additional Equipment:   Intra-op Plan:   Post-operative Plan: Extubation in OR  Informed Consent: I have reviewed the patients History and Physical, chart, labs and discussed the procedure including the risks, benefits and alternatives for the proposed anesthesia with the patient or authorized representative who has indicated his/her understanding and acceptance.     Dental advisory given  Plan Discussed with: CRNA and Anesthesiologist  Anesthesia Plan Comments:        Anesthesia Quick Evaluation

## 2022-07-01 NOTE — Transfer of Care (Signed)
Immediate Anesthesia Transfer of Care Note  Patient: Alexis Ramos  Procedure(s) Performed: Posterior Lumbar Interbody Fusion Lumbar Four-Five/Lumbar Five-Sacral One Posterior Lateral and Interbody fusion (Back)  Patient Location: PACU  Anesthesia Type:General  Level of Consciousness: drowsy and patient cooperative  Airway & Oxygen Therapy: Patient Spontanous Breathing and Patient connected to nasal cannula oxygen  Post-op Assessment: Report given to RN, Post -op Vital signs reviewed and stable and Patient moving all extremities X 4  Post vital signs: Reviewed and stable  Last Vitals:  Vitals Value Taken Time  BP 109/40 07/01/22 1207  Temp    Pulse 60 07/01/22 1210  Resp 16 07/01/22 1210  SpO2 99 % 07/01/22 1210  Vitals shown include unvalidated device data.  Last Pain:  Vitals:   07/01/22 0635  PainSc: 0-No pain         Complications: No notable events documented.

## 2022-07-01 NOTE — Op Note (Signed)
Preoperative diagnosis: Grade 1 spondylolisthesis L4-5 severe lumbar spinal stenosis bilateral L4-L5 radiculopathies and lumbar spinal stenosis degenerative disc disease L5-S1 with herniated nucleus pulposus on the left at L5-S1  Postoperative diagnosis: Same  Procedure: #1 decompressive lumbar laminectomies L4-5 L5-S1 with complete medial facetectomies and radical foraminotomies and removal and of bilateral pars interarticularis at L4 and L5 in excess and requiring more work than would be needed with a standard interbody fusion.  2.  Posterior lumbar interbody fusion L4-5 L5-S1 utilizing the globus titanium cages packed with locally harvested autograft mixed with Vivigen and Proteus  3.  Cortical screw fixation L4-S1 utilizing globus Creo amp modular cortical screw set.  4.  Posterior lateral arthrodesis L4-5 L5-S1 utilizing the autograft mixed with Vivigen and Proteus  5.  Help reduction spinal deformity.  Surgeon: Kary Kos.  Assistant: Deatra Ina.  Assistant #2 Nash Shearer  Anesthesia: General  EBL 200.  HPI: 71 year old female with back bilateral hip and leg pain rating down L4-L5 and S1 nerve root pattern.  Work-up revealed disc herniation L5-S1 grade 1 spinal listhesis at L4-5 severe spinal stenosis patient failed all forms conservative treatment with progressive worsening back pain neurogenic claudication and bilateral leg pain we recommended decompression stabilization procedure at L4-5 and L5-S1.  I extensively reviewed the risks and benefits of the operation with her as well as perioperative course expectations of outcome and alternatives of surgery and she understood and agreed to proceed forward.  Operative procedure: Patient was brought into the OR was induced under general anesthesia positioned prone the Wilson frame her back was prepped and draped in routine sterile fashion anatomical landmarks were used to draw out the incision incision initially and after  infiltration of 10 cc lidocaine with epi midline incision was made and Bovie that cautery was used to take down the subcutaneous tissue and subperiosteal dissection was carried lamina of L4-L5 and S1.  Intraoperative x-ray confirmed indication appropriate level.  Spinous processes at L4 and L5 removed facet joints were drilled down capturing the bone shavings and mucus trap central decompression was begun.  Complete medial facetectomies were performed at L4-5 and L5-S1 and there was marked hourglass compression of thecal sac at these levels.  Ligament flavum was markedly hypertrophied this was also removed I remove the pars at L4 and L5 and tract the L4 and L5 nerve roots out all the way out the foramen and decompressed them.  Then aggressively under bit the supra reticulating facet to gain access lateral margin of disc base.  Disc base was then coagulated and incised cleaned out bilaterally and with sequential distractors in place first working at L4-5 disc was removed endplates were prepared and I inserted the cages under fluoroscopy and the significantly reduced the the spondylolisthesis.  After all the cages were inserted with an extensive mount of autograft centrally and everything was confirmed to be in good position by fluoroscopy attention taken L5-S1.  Disc herniation was immediately identified on the left at L5-S1 this was removed the disc base was cleaned out endplates were prepared in similar fashion cages were also inserted here after opening up the disc base.  After all the interbody implants been placed cortical screws were placed all screws excellent purchase locking mechanisms were engaged this was all done under fluoroscopy.  Then all the foramina reinspected to confirm patency and no migration of graft material Gelfoam was overlaid top of the dura I aggressively decorticated the facet joints lateral residual of the pars and TPs and packed  the remainder of the locally harvested autograft mix  posterior laterally along the facet joints at L4-5 and L5-S1.  We then placed rods compressed L5 against S1 and L4 against L5 and anchored everything in place and then placed a medium Hemovac drain injected Exparel in the fascia and closed the wound in layers with interrupted Vicryl and a running 4 subcuticular.  Dermabond benzoin Steri-Strips and a sterile dressing was applied patient recovery in stable condition.  At the end the case all needle counts and sponge counts were correct.

## 2022-07-01 NOTE — Anesthesia Postprocedure Evaluation (Signed)
Anesthesia Post Note  Patient: Alexis Ramos  Procedure(s) Performed: Posterior Lumbar Interbody Fusion Lumbar Four-Five/Lumbar Five-Sacral One Posterior Lateral and Interbody fusion (Back)     Patient location during evaluation: PACU Anesthesia Type: General Level of consciousness: awake Pain management: pain level controlled Vital Signs Assessment: post-procedure vital signs reviewed and stable Respiratory status: spontaneous breathing Cardiovascular status: stable Postop Assessment: no apparent nausea or vomiting Anesthetic complications: no   No notable events documented.  Last Vitals:  Vitals:   07/01/22 1207 07/01/22 1230  BP:  122/61  Pulse:  60  Resp: 18 18  Temp: 37.1 C   SpO2:  92%    Last Pain:  Vitals:   07/01/22 0635  PainSc: 0-No pain                 Ivey Nembhard

## 2022-07-01 NOTE — H&P (Signed)
Alexis Ramos is an 71 y.o. female.   Chief Complaint: Back bilateral leg pain claudication HPI: 71 year old with back pain bilateral leg pain when walking.  Work-up has revealed severe lumbar spondylosis and stenosis at L4-5 and L5-S1.  Due to patient's progression of clinical syndrome imaging findings and failed conservative treatment I recommended decompressive laminectomy interbody fusion at those 2 levels.  I have extensively gone over the risks and benefits of the operation with her as well as perioperative course expectations of outcome and alternatives of surgery and she understands and agrees to proceed forward.  Past Medical History:  Diagnosis Date   Anemia    Arthritis    Cancer ([place])    melanoma on back    Crohn's disease ([place])    Depression    DES exposure in utero    Diabetes ([place])    type 2   Forgetfulness    GERD (gastroesophageal reflux disease)    History of kidney stones    Hyperlipidemia    Hypertension    Kidney stones    Menopausal symptoms    SBO (small bowel obstruction) ([place]) 06/2011, 11/2018   tx w/steroids   Snoring    TIA (transient ischemic attack) 04/2020   TIA (transient ischemic attack) 04/2020    Past Surgical History:  Procedure Laterality Date   Anal abscess surg     anal sphincterectomy     ANTERIOR CERVICAL DECOMP/DISCECTOMY FUSION N/A 04/01/2022   Procedure: CERVICAL THREE-FOUR ANTERIOR CERVICAL DECOMPRESSION/DISCECTOMY FUSION WITH REMOVAL OF PREVIOUS ANTERIOR CERVICAL PLATE;  Surgeon: Kary Kos, MD;  Location: [facility];  Service: Neurosurgery;  Laterality: N/A;   [place]   small, x 3   BREAST BIOPSY Left    BREAST SURGERY     Biopsy-Benign   CERVICAL FUSION  05/2009   C4-C7   CHOLECYSTECTOMY N/A 08/10/2016   Procedure: LAPAROSCOPIC CHOLECYSTECTOMY WITH INTRAOPERATIVE CHOLANGIOGRAM -STANDARD 4 PORT;  Surgeon: [name], MD;  Location: [facility];  Service: General;  Laterality: N/A;    DILATION AND CURETTAGE OF UTERUS  1984   HAND SURGERY     ILEOCECETOMY  1971   Crohns disease flare   KIDNEY SURGERY  1992   x2   KNEE SURGERY Left 1998   LAPAROSCOPIC LYSIS OF ADHESIONS N/A 08/10/2016   Procedure: LAPAROSCOPIC LYSIS OF ADHESIONS WITH CORE LIVER BIOPSY;  Surgeon: [name], MD;  Location: [facility];  Service: General;  Laterality: N/A;   LIPOSUCTION  2004   neck   NASAL SINUS SURGERY  1995   PR DRAIN/INJECT LARGE JOINT/BURSA  05/11/2021       SKIN CANCER EXCISION  03/2016   melanoma, back   SMALL INTESTINE SURGERY  1985   Crohn stricture   SMALL INTESTINE SURGERY  1996   Crohn stricture   Thumb surg     TONGUE BASE REDUCTION SOMNOPLASTY  2000   Removed uvula   TONSILLECTOMY AND ADENOIDECTOMY  1959   WISDOM TOOTH EXTRACTION  1974    Family History  Problem Relation Age of Onset   Diabetes Mother    Hypertension Mother    Osteoporosis Mother    Hypertension Father    Heart disease Father    Prostate cancer Father    Diabetes Brother    Hypertension Brother    Cancer Other        Niece-Glioblastoma   Breast cancer Neg Hx    Social History:  reports that she  has never smoked. She has never used smokeless tobacco. She reports that she does not drink alcohol and does not use drugs.  Allergies:  Allergies  Allergen Reactions   Nsaids Other (See Comments)    H/o Crohn's disease, causes bleeding    Penicillins Anaphylaxis    Pt grandmother died from it and her brother had anaphylaxis so she was told never to take.  Has patient had a PCN reaction causing immediate rash, facial/tongue/throat swelling, SOB or lightheadedness with hypotension: No Has patient had a PCN reaction causing severe rash involving mucus membranes or skin necrosis: No Has patient had a PCN reaction that required hospitalization No Has patient had a PCN reaction occurring within the last 10 years: No If all of the above answers are "NO", then may pr    Medications Prior to Admission   Medication Sig Dispense Refill   amLODipine (NORVASC) 5 MG tablet Take 1 tablet (5 mg total) by mouth daily. (Patient taking differently: Take 5 mg by mouth every evening.) 30 tablet 0   cyanocobalamin (,VITAMIN B-12,) 1000 MCG/ML injection Inject 1,500 mcg into the muscle every 21 ( twenty-one) days.     fenofibrate 54 MG tablet Take 54 mg by mouth every other day. In the morning.     losartan (COZAAR) 100 MG tablet Take 1 tablet (100 mg total) by mouth daily. 30 tablet 0   metoprolol succinate (TOPROL-XL) 100 MG 24 hr tablet Take 50 mg by mouth 2 (two) times daily.     Multiple Vitamins-Minerals (PRESERVISION AREDS) CAPS Take 1 capsule by mouth 2 (two) times daily.     omeprazole (PRILOSEC) 20 MG capsule Take 20 mg by mouth daily before breakfast.     pioglitazone (ACTOS) 15 MG tablet Take 15 mg by mouth in the morning.     venlafaxine (EFFEXOR) 75 MG tablet Take 150 mg by mouth 2 (two) times daily with a meal.     Vitamin E 400 units TABS Take 400 Units by mouth 2 (two) times daily.     Probiotic Product (PROBIOTIC PO) Take 1 capsule by mouth daily as needed (with antibiotic regimen.).     triamcinolone (NASACORT) 55 MCG/ACT AERO nasal inhaler Place 1 spray into the nose daily as needed (sinus issues.).      Results for orders placed or performed during the hospital encounter of 07/01/22 (from the past 48 hour(s))  Glucose, capillary     Status: Abnormal   Collection Time: 07/01/22  6:26 AM  Result Value Ref Range   Glucose-Capillary 150 (H) 70 - 99 mg/dL    Comment: Glucose reference range applies only to samples taken after fasting for at least 8 hours.  ABO/Rh     Status: None   Collection Time: 07/01/22  6:43 AM  Result Value Ref Range   ABO/RH(D)      O POS Performed at [facility] [address] [place], [place] [address]    No results found.  Review of Systems  Musculoskeletal:  Positive for back pain.  Neurological:  Positive for numbness.    Blood pressure  (!) 141/72, pulse 61, temperature 98 F (36.7 C), resp. rate 17, height 4' 11"  (1.499 m), weight 54.4 kg, SpO2 97 %. Physical Exam HENT:     Head: Normocephalic.     Right Ear: Tympanic membrane normal.     Nose: Nose normal.     Mouth/Throat:     Mouth: Mucous membranes are moist.  Eyes:  Pupils: Pupils are equal, round, and reactive to light.  Cardiovascular:     Rate and Rhythm: Normal rate.     Pulses: Normal pulses.  Pulmonary:     Effort: Pulmonary effort is normal.  Abdominal:     General: Abdomen is flat.  Neurological:     Mental Status: She is alert.     Comments: Strength is 5 out of 5 iliopsoas, quads, hamstrings, gastrocs, into tibialis, and EHL.      Assessment/Plan 71 year old presents for decompression stabilization procedure at L4-5 L5-S1  Elaina Hoops, MD 07/01/2022, 7:52 AM

## 2022-07-01 NOTE — Progress Notes (Signed)
PHARMACY NOTE:  ANTIMICROBIAL RENAL DOSAGE ADJUSTMENT  Current antimicrobial regimen includes a mismatch between antimicrobial dosage and estimated renal function.  As per policy approved by the Pharmacy & Therapeutics and Medical Executive Committees, the antimicrobial dosage will be adjusted accordingly.  Current antimicrobial dosage:  cefazolin 2g q8 hr  Indication: surgical prophylaxis   Renal Function:  Estimated Creatinine Clearance: 27.9 mL/min (A) (by C-G formula based on SCr of 1.35 mg/dL (H)).   Antimicrobial dosage has been changed to:  2g q24 hr   Additional comments: received 1g vancomycin at 7am that will cover patient for at least 24 hrs    Thank you for allowing pharmacy to be a part of this patient's care.   Benetta Spar, PharmD, BCPS, BCCP Clinical Pharmacist  Please check AMION for all [facility] phone numbers After 10:00 PM, call [facility] [phone]

## 2022-07-02 DIAGNOSIS — M4316 Spondylolisthesis, lumbar region: Secondary | ICD-10-CM | POA: Diagnosis not present

## 2022-07-02 DIAGNOSIS — M5117 Intervertebral disc disorders with radiculopathy, lumbosacral region: Secondary | ICD-10-CM | POA: Diagnosis not present

## 2022-07-02 DIAGNOSIS — M1990 Unspecified osteoarthritis, unspecified site: Secondary | ICD-10-CM | POA: Diagnosis not present

## 2022-07-02 DIAGNOSIS — I10 Essential (primary) hypertension: Secondary | ICD-10-CM | POA: Diagnosis not present

## 2022-07-02 DIAGNOSIS — E119 Type 2 diabetes mellitus without complications: Secondary | ICD-10-CM | POA: Diagnosis not present

## 2022-07-02 DIAGNOSIS — M5116 Intervertebral disc disorders with radiculopathy, lumbar region: Secondary | ICD-10-CM | POA: Diagnosis not present

## 2022-07-02 DIAGNOSIS — M48061 Spinal stenosis, lumbar region without neurogenic claudication: Secondary | ICD-10-CM | POA: Diagnosis not present

## 2022-07-02 DIAGNOSIS — K219 Gastro-esophageal reflux disease without esophagitis: Secondary | ICD-10-CM | POA: Diagnosis not present

## 2022-07-02 DIAGNOSIS — Z79899 Other long term (current) drug therapy: Secondary | ICD-10-CM | POA: Diagnosis not present

## 2022-07-02 LAB — GLUCOSE, CAPILLARY: Glucose-Capillary: 193 mg/dL — ABNORMAL HIGH (ref 70–99)

## 2022-07-02 MED ORDER — HYDROCODONE-ACETAMINOPHEN 5-325 MG PO TABS: 1.0000 | ORAL_TABLET | ORAL | 0 refills | Status: AC | PRN

## 2022-07-02 MED ORDER — CYCLOBENZAPRINE HCL 10 MG PO TABS: 10.0000 mg | ORAL_TABLET | Freq: Three times a day (TID) | ORAL | 0 refills | Status: DC | PRN

## 2022-07-02 NOTE — Discharge Instructions (Signed)
Wound Care  Keep the incision clean and dry remove the outer dressing in 3 days, leave the Steri-Strips intact.  Do not put any creams, lotions, or ointments on incision. Leave steri-strips on back.  They will fall off by themselves.  Activity Walk each and every day, increasing distance each day. No lifting greater than 5 lbs.  No lifting no bending no twisting no driving or riding a car unless coming back and forth to see me. If provided with back brace, wear when out of bed.  It is not necessary to wear brace in bed. Diet Resume your normal diet.   Call Your Doctor If Any of These Occur Redness, drainage, or swelling at the wound.  Temperature greater than 101 degrees. Severe pain not relieved by pain medication. Incision starts to come apart. Follow Up Appt Call [phone])  for problems.  If you have any hardware placed in your spine, you will need an x-ray before your appointment.

## 2022-07-02 NOTE — Evaluation (Signed)
Physical Therapy Evaluation and Discharge Patient Details Name: Alexis Ramos MRN: [id number] DOB: Aug 19, 1951 Today's Date: 07/02/2022  History of Present Illness  71 year old adm 07/01/22 with back pain bilateral leg pain when walking.  Work-up has revealed severe lumbar spondylosis and stenosis at L4-5 and L5-S1.  Underwent decompressive lumbar laminectomies, facetectomies, foraminotomies L4-5 L5-S1 and Posterior lumbar interbody fusion L4-5 L5-S1  Clinical Impression   Patient evaluated by Physical Therapy with no further acute PT needs identified. All education has been completed and the patient has no further questions. No follow-up Physical Therapy or equipment needs. PT is signing off. Thank you for this referral.        Recommendations for follow up therapy are one component of a multi-disciplinary discharge planning process, led by the attending physician.  Recommendations may be updated based on patient status, additional functional criteria and insurance authorization.  Follow Up Recommendations No PT follow up      Assistance Recommended at Discharge PRN  Patient can return home with the following       Equipment Recommendations None recommended by PT  Recommendations for Other Services  OT consult    Functional Status Assessment Patient has had a recent decline in their functional status and demonstrates the ability to make significant improvements in function in a reasonable and predictable amount of time.     Precautions / Restrictions Precautions Precautions: Back Precaution Booklet Issued: Yes (comment) Precaution Comments: pt able to state 3/3 precautions; cues needed for not twisting Required Braces or Orthoses: Spinal Brace Spinal Brace: Lumbar corset      Mobility  Bed Mobility Overal bed mobility: Needs Assistance Bed Mobility: Rolling, Sidelying to Sit, Sit to Sidelying Rolling: Supervision Sidelying to sit: Supervision     Sit to sidelying:  Supervision General bed mobility comments: vc for technique with good demonstration    Transfers Overall transfer level: Independent Equipment used: None                    Ambulation/Gait Ambulation/Gait assistance: Modified independent (Device/Increase time) Gait Distance (Feet): 120 Feet Assistive device: None Gait Pattern/deviations: Step-through pattern, Wide base of support Gait velocity: WNL Gait velocity interpretation: >2.62 ft/sec, indicative of community ambulatory   General Gait Details: pt reports h/o imbalance and falls and has learned to compensate by widening her BOS; no imbalance noted  Stairs Stairs: Yes Stairs assistance: Modified independent (Device/Increase time) Stair Management: One rail Left, Alternating pattern, Step to pattern Number of Stairs: 7 General stair comments: ascending alternating; descend sideways with step to  Wheelchair Mobility    Modified Rankin (Stroke Patients Only)       Balance Overall balance assessment: Modified Independent                                           Pertinent Vitals/Pain Pain Assessment Pain Assessment: Faces Faces Pain Scale: Hurts a little bit Pain Location: back Pain Descriptors / Indicators: Guarding, Operative site guarding Pain Intervention(s): Limited activity within patient's tolerance, Monitored during session    Home Living Family/patient expects to be discharged to:: Private residence Living Arrangements: Alone Available Help at Discharge: Available PRN/intermittently;Friend(s) Type of Home: House Home Access: Stairs to enter Entrance Stairs-Rails: Psychiatric nurse of Steps: 3   Home Layout: One level Home Equipment: Rollator (4 wheels);Shower seat;Hand held shower head;Wheelchair - Insurance risk surveyor Comments: has a Magazine features editor  Prior Function Prior Level of Function : Independent/Modified Independent                      Hand Dominance   Dominant Hand: Right    Extremity/Trunk Assessment   Upper Extremity Assessment Upper Extremity Assessment: Defer to OT evaluation    Lower Extremity Assessment Lower Extremity Assessment: Overall WFL for tasks assessed    Cervical / Trunk Assessment Cervical / Trunk Assessment: Normal  Communication   Communication: No difficulties  Cognition Arousal/Alertness: Awake/alert Behavior During Therapy: WFL for tasks assessed/performed Overall Cognitive Status: Within Functional Limits for tasks assessed                                          General Comments General comments (skin integrity, edema, etc.): order in chart for no brace; after ambulating pt noted MD had her get a brace prior to surgery and was indeed in her room. OT in and clarified MD gives them brace prior to surgery and then writes "no brace" so hospital does not also provide a brace. Clarified with pt that she should wear her brace whenever up and walking    Exercises     Assessment/Plan    PT Assessment Patient does not need any further PT services  PT Problem List         PT Treatment Interventions      PT Goals (Current goals can be found in the Care Plan section)  Acute Rehab PT Goals Patient Stated Goal: go home PT Goal Formulation: All assessment and education complete, DC therapy    Frequency       Co-evaluation               AM-PAC PT "6 Clicks" Mobility  Outcome Measure Help needed turning from your back to your side while in a flat bed without using bedrails?: A Little Help needed moving from lying on your back to sitting on the side of a flat bed without using bedrails?: A Little Help needed moving to and from a bed to a chair (including a wheelchair)?: None Help needed standing up from a chair using your arms (e.g., wheelchair or bedside chair)?: None Help needed to walk in hospital room?: None Help needed climbing 3-5 steps with a  railing? : None 6 Click Score: 22    End of Session   Activity Tolerance: Patient tolerated treatment well Patient left: in bed;Other (comment) (sitting EOB with OT present)   PT Visit Diagnosis: Unsteadiness on feet (R26.81);Pain Pain - Right/Left:  (midline) Pain - part of body:  (back)    Time: 9191-6606 PT Time Calculation (min) (ACUTE ONLY): 18 min   Charges:   PT Evaluation $PT Eval Low Complexity: [place], PT Acute Rehabilitation Services  Office [phone]   Rexanne Mano 07/02/2022, 8:16 AM

## 2022-07-02 NOTE — Discharge Summary (Signed)
Discharge Summary  Date of Admission: 07/01/2022  Date of Discharge: 07/02/22  Attending Physician: Kary Kos, MD  Hospital Course: Patient was admitted following an uncomplicated F4-Z4 lami fusion. They were recovered in PACU and transferred to [facility] Their preop symptoms were completely resolved, their hospital course was uncomplicated and the patient was discharged home on 07/02/22. They will follow up in clinic with me in clinic in 2 weeks.  Neurologic exam at discharge:  Strength 5/5 x4 and SILTx4   Discharge diagnosis: Lumbar spondylolisthesis, lumbar radiculopathy  Judith Part, MD 07/02/22 7:56 AM

## 2022-07-02 NOTE — Progress Notes (Signed)
Neurosurgery Service Progress Note  Subjective: No acute events overnight, preop leg symptoms resolved   Objective: Vitals:   07/01/22 1954 07/01/22 2342 07/02/22 0506 07/02/22 0709  BP: (!) 108/46 (!) 100/45 (!) 137/57 126/61  Pulse: 67 64 70 70  Resp: 16 18 15 18   Temp: 98 F (36.7 C) 97.9 F (36.6 C) 98.1 F (36.7 C) 98.1 F (36.7 C)  TempSrc: Oral Oral Oral Oral  SpO2: 96% 95% 98% 97%  Weight:      Height:        Physical Exam: Strength 5/5 x4 and SILTx4  Assessment & Plan: 71 y.o. woman s/p L4-S1 lami / fusion, recovering well.  -discharge home today  Judith Part  07/02/22 7:55 AM

## 2022-07-02 NOTE — Evaluation (Signed)
Occupational Therapy Evaluation Patient Details Name: Alexis Ramos MRN: [id number] DOB: 10-Mar-1951 Today's Date: 07/02/2022   History of Present Illness 71 year old adm 07/01/22 with back pain bilateral leg pain when walking.  Work-up has revealed severe lumbar spondylosis and stenosis at L4-5 and L5-S1.  Underwent decompressive lumbar laminectomies, facetectomies, foraminotomies L4-5 L5-S1 and Posterior lumbar interbody fusion L4-5 L5-S1   Clinical Impression   Alexis Ramos was evaluated s/p the above back surgery, she is indep at baseline and lives alone with plans for support of friends after d/c. Upon evaluation pt had great recall of back precautions. Educated on compensatory techniques for LB dressing with reacher, toileting and bathing and pt demonstrated great understanding. Overall she was provided with supervision A throughout session for safety only. Pt encouraged to have assistance from her friends with heavy IADL household chores, lawn mowing and tending to her garden initially. She does not have further OT needs acute for at d/c.      Recommendations for follow up therapy are one component of a multi-disciplinary discharge planning process, led by the attending physician.  Recommendations may be updated based on patient status, additional functional criteria and insurance authorization.   Follow Up Recommendations  No OT follow up    Assistance Recommended at Discharge PRN  Patient can return home with the following Assist for transportation;Assistance with cooking/housework    Functional Status Assessment  Patient has had a recent decline in their functional status and demonstrates the ability to make significant improvements in function in a reasonable and predictable amount of time.  Equipment Recommendations  None recommended by OT       Precautions / Restrictions Precautions Precautions: Back Precaution Booklet Issued: Yes (comment) Precaution Comments: pt able to state  3/3 precautions; cues needed for not twisting Required Braces or Orthoses: Spinal Brace Spinal Brace: Lumbar corset;Applied in sitting position Restrictions Weight Bearing Restrictions: No      Mobility Bed Mobility Overal bed mobility: Needs Assistance             General bed mobility comments: EOB upon arrival    Transfers Overall transfer level: Independent Equipment used: None                      Balance Overall balance assessment: Modified Independent         ADL either performed or assessed with clinical judgement   ADL Overall ADL's : Needs assistance/impaired               Functional mobility during ADLs: Supervision/safety;Rolling walker (2 wheels) General ADL Comments: pt able to demonstrate all ADLs, functional transfers, and mobility with supervision A and maintaince of back precautions after review of compensatory techniques for LB tasks. No AD used this session, educated on use of rollator at home for increased safety and to carry household supplies with use of seat/basket     Vision Baseline Vision/History: 0 No visual deficits Ability to See in Adequate Light: 0 Adequate Vision Assessment?: No apparent visual deficits            Pertinent Vitals/Pain Pain Assessment Pain Assessment: Faces Faces Pain Scale: Hurts a little bit Pain Location: back Pain Descriptors / Indicators: Guarding, Operative site guarding Pain Intervention(s): Limited activity within patient's tolerance, Monitored during session     Hand Dominance Right   Extremity/Trunk Assessment Upper Extremity Assessment Upper Extremity Assessment: Overall WFL for tasks assessed   Lower Extremity Assessment Lower Extremity Assessment: Defer to PT evaluation  Cervical / Trunk Assessment Cervical / Trunk Assessment: Back Surgery   Communication Communication Communication: No difficulties   Cognition Arousal/Alertness: Awake/alert Behavior During Therapy: WFL  for tasks assessed/performed Overall Cognitive Status: Within Functional Limits for tasks assessed           General Comments: good recall of pack precautions and return demonstration of compensatory techniques after review     General Comments  VSS on RA, wound vac attached            Home Living Family/patient expects to be discharged to:: Private residence Living Arrangements: Alone Available Help at Discharge: Available PRN/intermittently;Friend(s) Type of Home: House Home Access: Stairs to enter [employer] of Steps: 3 Entrance Stairs-Rails: Right;Left Home Layout: One level     Bathroom Shower/Tub: Occupational psychologist: Standard Bathroom Accessibility: Yes   Home Equipment: Rollator (4 wheels);Shower seat;Hand held shower head;Wheelchair - Transport planner Comments: has a dog      Prior Functioning/Environment Prior Level of Function : Independent/Modified Independent             Mobility Comments: no AD ADLs Comments: indep, drives, works part time or a pharmacy        OT Problem List: Decreased activity tolerance;Decreased knowledge of precautions;Pain         OT Goals(Current goals can be found in the care plan section) Acute Rehab OT Goals Patient Stated Goal: home OT Goal Formulation: All assessment and education complete, DC therapy Time For Goal Achievement: 07/02/22   AM-PAC OT "6 Clicks" Daily Activity     Outcome Measure Help from another person eating meals?: None Help from another person taking care of personal grooming?: A Little Help from another person toileting, which includes using toliet, bedpan, or urinal?: A Little Help from another person bathing (including washing, rinsing, drying)?: A Little Help from another person to put on and taking off regular upper body clothing?: None Help from another person to put on and taking off regular lower body clothing?:  A Little 6 Click Score: 20   End of Session Equipment Utilized During Treatment: Back brace Nurse Communication: Mobility status  Activity Tolerance: Patient tolerated treatment well Patient left: in bed;with call bell/phone within reach  OT Visit Diagnosis: Other abnormalities of gait and mobility (R26.89);Pain                Time: 3748-2707 OT Time Calculation (min): 24 min Charges:  OT General Charges $OT Visit: 1 Visit OT Evaluation $OT Eval Moderate Complexity: 1 Mod OT Treatments $Self Care/Home Management : 8-22 mins   Johneric Mcfadden A Keyen Marban 07/02/2022, 9:33 AM

## 2022-07-02 NOTE — Progress Notes (Signed)
Patient alert and oriented, mae's well, voiding adequate amount of urine, swallowing without difficulty, no c/o pain at time of discharge. Patient discharged home with family. Script and discharged instructions given to patient. Patient and family stated understanding of instructions given. Patient has an appointment with Dr. Saintclair Halsted

## 2022-08-18 DIAGNOSIS — M4316 Spondylolisthesis, lumbar region: Secondary | ICD-10-CM | POA: Diagnosis not present

## 2022-09-29 DIAGNOSIS — M48062 Spinal stenosis, lumbar region with neurogenic claudication: Secondary | ICD-10-CM | POA: Diagnosis not present

## 2022-10-04 DIAGNOSIS — E785 Hyperlipidemia, unspecified: Secondary | ICD-10-CM | POA: Diagnosis not present

## 2022-10-04 DIAGNOSIS — R7989 Other specified abnormal findings of blood chemistry: Secondary | ICD-10-CM | POA: Diagnosis not present

## 2022-10-04 DIAGNOSIS — R7301 Impaired fasting glucose: Secondary | ICD-10-CM | POA: Diagnosis not present

## 2022-10-04 DIAGNOSIS — E559 Vitamin D deficiency, unspecified: Secondary | ICD-10-CM | POA: Diagnosis not present

## 2022-10-04 DIAGNOSIS — E1129 Type 2 diabetes mellitus with other diabetic kidney complication: Secondary | ICD-10-CM | POA: Diagnosis not present

## 2022-10-04 DIAGNOSIS — E538 Deficiency of other specified B group vitamins: Secondary | ICD-10-CM | POA: Diagnosis not present

## 2022-10-04 DIAGNOSIS — I10 Essential (primary) hypertension: Secondary | ICD-10-CM | POA: Diagnosis not present

## 2022-10-06 DIAGNOSIS — K7581 Nonalcoholic steatohepatitis (NASH): Secondary | ICD-10-CM | POA: Diagnosis not present

## 2022-10-06 DIAGNOSIS — E872 Acidosis, unspecified: Secondary | ICD-10-CM | POA: Diagnosis not present

## 2022-10-06 DIAGNOSIS — I129 Hypertensive chronic kidney disease with stage 1 through stage 4 chronic kidney disease, or unspecified chronic kidney disease: Secondary | ICD-10-CM | POA: Diagnosis not present

## 2022-10-06 DIAGNOSIS — N183 Chronic kidney disease, stage 3 unspecified: Secondary | ICD-10-CM | POA: Diagnosis not present

## 2022-10-06 DIAGNOSIS — K5090 Crohn's disease, unspecified, without complications: Secondary | ICD-10-CM | POA: Diagnosis not present

## 2022-10-11 DIAGNOSIS — E1129 Type 2 diabetes mellitus with other diabetic kidney complication: Secondary | ICD-10-CM | POA: Diagnosis not present

## 2022-10-11 DIAGNOSIS — I700 Atherosclerosis of aorta: Secondary | ICD-10-CM | POA: Diagnosis not present

## 2022-10-11 DIAGNOSIS — K5090 Crohn's disease, unspecified, without complications: Secondary | ICD-10-CM | POA: Diagnosis not present

## 2022-10-11 DIAGNOSIS — N1832 Chronic kidney disease, stage 3b: Secondary | ICD-10-CM | POA: Diagnosis not present

## 2022-10-11 DIAGNOSIS — Z0000 Encounter for general adult medical examination without abnormal findings: Secondary | ICD-10-CM | POA: Diagnosis not present

## 2022-10-11 DIAGNOSIS — I129 Hypertensive chronic kidney disease with stage 1 through stage 4 chronic kidney disease, or unspecified chronic kidney disease: Secondary | ICD-10-CM | POA: Diagnosis not present

## 2022-10-11 DIAGNOSIS — E559 Vitamin D deficiency, unspecified: Secondary | ICD-10-CM | POA: Diagnosis not present

## 2022-10-11 DIAGNOSIS — D649 Anemia, unspecified: Secondary | ICD-10-CM | POA: Diagnosis not present

## 2022-10-11 DIAGNOSIS — E785 Hyperlipidemia, unspecified: Secondary | ICD-10-CM | POA: Diagnosis not present

## 2022-11-14 DIAGNOSIS — L578 Other skin changes due to chronic exposure to nonionizing radiation: Secondary | ICD-10-CM | POA: Diagnosis not present

## 2022-11-14 DIAGNOSIS — Z86006 Personal history of melanoma in-situ: Secondary | ICD-10-CM | POA: Diagnosis not present

## 2022-11-14 DIAGNOSIS — Z85828 Personal history of other malignant neoplasm of skin: Secondary | ICD-10-CM | POA: Diagnosis not present

## 2022-11-14 DIAGNOSIS — D235 Other benign neoplasm of skin of trunk: Secondary | ICD-10-CM | POA: Diagnosis not present

## 2022-11-14 DIAGNOSIS — Z08 Encounter for follow-up examination after completed treatment for malignant neoplasm: Secondary | ICD-10-CM | POA: Diagnosis not present

## 2022-11-14 DIAGNOSIS — Z872 Personal history of diseases of the skin and subcutaneous tissue: Secondary | ICD-10-CM | POA: Diagnosis not present

## 2022-12-30 ENCOUNTER — Other Ambulatory Visit: Payer: Self-pay | Admitting: Internal Medicine

## 2022-12-30 DIAGNOSIS — R590 Localized enlarged lymph nodes: Secondary | ICD-10-CM

## 2023-01-03 DIAGNOSIS — M48062 Spinal stenosis, lumbar region with neurogenic claudication: Secondary | ICD-10-CM | POA: Diagnosis not present

## 2023-01-04 DIAGNOSIS — G5622 Lesion of ulnar nerve, left upper limb: Secondary | ICD-10-CM | POA: Diagnosis not present

## 2023-01-04 DIAGNOSIS — G5602 Carpal tunnel syndrome, left upper limb: Secondary | ICD-10-CM | POA: Diagnosis not present

## 2023-01-19 ENCOUNTER — Ambulatory Visit
Admission: RE | Admit: 2023-01-19 | Discharge: 2023-01-19 | Disposition: A | Discharge status: Home / Self Care | Payer: Medicare PPO | Source: Ambulatory Visit | Attending: Internal Medicine | Admitting: Internal Medicine

## 2023-01-19 DIAGNOSIS — R590 Localized enlarged lymph nodes: Secondary | ICD-10-CM

## 2023-02-20 DIAGNOSIS — G5602 Carpal tunnel syndrome, left upper limb: Secondary | ICD-10-CM | POA: Diagnosis not present

## 2023-03-07 DIAGNOSIS — E1129 Type 2 diabetes mellitus with other diabetic kidney complication: Secondary | ICD-10-CM | POA: Diagnosis not present

## 2023-03-07 DIAGNOSIS — N1832 Chronic kidney disease, stage 3b: Secondary | ICD-10-CM | POA: Diagnosis not present

## 2023-03-07 DIAGNOSIS — I739 Peripheral vascular disease, unspecified: Secondary | ICD-10-CM | POA: Diagnosis not present

## 2023-03-07 DIAGNOSIS — E785 Hyperlipidemia, unspecified: Secondary | ICD-10-CM | POA: Diagnosis not present

## 2023-03-07 DIAGNOSIS — I700 Atherosclerosis of aorta: Secondary | ICD-10-CM | POA: Diagnosis not present

## 2023-03-07 DIAGNOSIS — E1151 Type 2 diabetes mellitus with diabetic peripheral angiopathy without gangrene: Secondary | ICD-10-CM | POA: Diagnosis not present

## 2023-04-12 DIAGNOSIS — I129 Hypertensive chronic kidney disease with stage 1 through stage 4 chronic kidney disease, or unspecified chronic kidney disease: Secondary | ICD-10-CM | POA: Diagnosis not present

## 2023-04-12 DIAGNOSIS — E785 Hyperlipidemia, unspecified: Secondary | ICD-10-CM | POA: Diagnosis not present

## 2023-04-12 DIAGNOSIS — E1129 Type 2 diabetes mellitus with other diabetic kidney complication: Secondary | ICD-10-CM | POA: Diagnosis not present

## 2023-04-12 DIAGNOSIS — K760 Fatty (change of) liver, not elsewhere classified: Secondary | ICD-10-CM | POA: Diagnosis not present

## 2023-04-12 DIAGNOSIS — N1832 Chronic kidney disease, stage 3b: Secondary | ICD-10-CM | POA: Diagnosis not present

## 2023-05-02 DIAGNOSIS — R0609 Other forms of dyspnea: Secondary | ICD-10-CM | POA: Diagnosis not present

## 2023-05-02 DIAGNOSIS — T148XXA Other injury of unspecified body region, initial encounter: Secondary | ICD-10-CM | POA: Diagnosis not present

## 2023-05-02 DIAGNOSIS — I129 Hypertensive chronic kidney disease with stage 1 through stage 4 chronic kidney disease, or unspecified chronic kidney disease: Secondary | ICD-10-CM | POA: Diagnosis not present

## 2023-05-02 DIAGNOSIS — R600 Localized edema: Secondary | ICD-10-CM | POA: Diagnosis not present

## 2023-05-02 DIAGNOSIS — N1832 Chronic kidney disease, stage 3b: Secondary | ICD-10-CM | POA: Diagnosis not present

## 2023-05-02 DIAGNOSIS — I739 Peripheral vascular disease, unspecified: Secondary | ICD-10-CM | POA: Diagnosis not present

## 2023-05-16 DIAGNOSIS — L820 Inflamed seborrheic keratosis: Secondary | ICD-10-CM | POA: Diagnosis not present

## 2023-05-16 DIAGNOSIS — C44519 Basal cell carcinoma of skin of other part of trunk: Secondary | ICD-10-CM | POA: Diagnosis not present

## 2023-05-16 DIAGNOSIS — Z872 Personal history of diseases of the skin and subcutaneous tissue: Secondary | ICD-10-CM | POA: Diagnosis not present

## 2023-05-16 DIAGNOSIS — D235 Other benign neoplasm of skin of trunk: Secondary | ICD-10-CM | POA: Diagnosis not present

## 2023-05-16 DIAGNOSIS — Z86006 Personal history of melanoma in-situ: Secondary | ICD-10-CM | POA: Diagnosis not present

## 2023-05-16 DIAGNOSIS — Z08 Encounter for follow-up examination after completed treatment for malignant neoplasm: Secondary | ICD-10-CM | POA: Diagnosis not present

## 2023-05-16 DIAGNOSIS — L578 Other skin changes due to chronic exposure to nonionizing radiation: Secondary | ICD-10-CM | POA: Diagnosis not present

## 2023-05-16 DIAGNOSIS — Z85828 Personal history of other malignant neoplasm of skin: Secondary | ICD-10-CM | POA: Diagnosis not present

## 2023-05-16 DIAGNOSIS — I788 Other diseases of capillaries: Secondary | ICD-10-CM | POA: Diagnosis not present

## 2023-05-16 DIAGNOSIS — L821 Other seborrheic keratosis: Secondary | ICD-10-CM | POA: Diagnosis not present

## 2023-05-16 DIAGNOSIS — D485 Neoplasm of uncertain behavior of skin: Secondary | ICD-10-CM | POA: Diagnosis not present

## 2023-06-14 DIAGNOSIS — R350 Frequency of micturition: Secondary | ICD-10-CM | POA: Diagnosis not present

## 2023-06-14 DIAGNOSIS — N950 Postmenopausal bleeding: Secondary | ICD-10-CM | POA: Diagnosis not present

## 2023-07-03 DIAGNOSIS — D225 Melanocytic nevi of trunk: Secondary | ICD-10-CM | POA: Diagnosis not present

## 2023-07-03 DIAGNOSIS — L570 Actinic keratosis: Secondary | ICD-10-CM | POA: Diagnosis not present

## 2023-07-03 DIAGNOSIS — C44519 Basal cell carcinoma of skin of other part of trunk: Secondary | ICD-10-CM | POA: Diagnosis not present

## 2023-10-04 DIAGNOSIS — H2513 Age-related nuclear cataract, bilateral: Secondary | ICD-10-CM | POA: Diagnosis not present

## 2023-10-04 DIAGNOSIS — H04123 Dry eye syndrome of bilateral lacrimal glands: Secondary | ICD-10-CM | POA: Diagnosis not present

## 2023-10-04 DIAGNOSIS — H5213 Myopia, bilateral: Secondary | ICD-10-CM | POA: Diagnosis not present

## 2023-10-04 DIAGNOSIS — H353132 Nonexudative age-related macular degeneration, bilateral, intermediate dry stage: Secondary | ICD-10-CM | POA: Diagnosis not present

## 2023-10-04 DIAGNOSIS — H52203 Unspecified astigmatism, bilateral: Secondary | ICD-10-CM | POA: Diagnosis not present

## 2023-10-04 DIAGNOSIS — E119 Type 2 diabetes mellitus without complications: Secondary | ICD-10-CM | POA: Diagnosis not present

## 2023-10-10 DIAGNOSIS — N183 Chronic kidney disease, stage 3 unspecified: Secondary | ICD-10-CM | POA: Diagnosis not present

## 2023-10-10 DIAGNOSIS — K5090 Crohn's disease, unspecified, without complications: Secondary | ICD-10-CM | POA: Diagnosis not present

## 2023-10-10 DIAGNOSIS — E872 Acidosis, unspecified: Secondary | ICD-10-CM | POA: Diagnosis not present

## 2023-10-10 DIAGNOSIS — I129 Hypertensive chronic kidney disease with stage 1 through stage 4 chronic kidney disease, or unspecified chronic kidney disease: Secondary | ICD-10-CM | POA: Diagnosis not present

## 2023-10-10 DIAGNOSIS — K7581 Nonalcoholic steatohepatitis (NASH): Secondary | ICD-10-CM | POA: Diagnosis not present

## 2023-11-14 DIAGNOSIS — Z1212 Encounter for screening for malignant neoplasm of rectum: Secondary | ICD-10-CM | POA: Diagnosis not present

## 2023-11-14 DIAGNOSIS — K5090 Crohn's disease, unspecified, without complications: Secondary | ICD-10-CM | POA: Diagnosis not present

## 2023-11-14 DIAGNOSIS — E559 Vitamin D deficiency, unspecified: Secondary | ICD-10-CM | POA: Diagnosis not present

## 2023-11-14 DIAGNOSIS — I129 Hypertensive chronic kidney disease with stage 1 through stage 4 chronic kidney disease, or unspecified chronic kidney disease: Secondary | ICD-10-CM | POA: Diagnosis not present

## 2023-11-14 DIAGNOSIS — E785 Hyperlipidemia, unspecified: Secondary | ICD-10-CM | POA: Diagnosis not present

## 2023-11-14 DIAGNOSIS — E538 Deficiency of other specified B group vitamins: Secondary | ICD-10-CM | POA: Diagnosis not present

## 2023-11-14 DIAGNOSIS — E1129 Type 2 diabetes mellitus with other diabetic kidney complication: Secondary | ICD-10-CM | POA: Diagnosis not present

## 2023-11-21 DIAGNOSIS — E785 Hyperlipidemia, unspecified: Secondary | ICD-10-CM | POA: Diagnosis not present

## 2023-11-21 DIAGNOSIS — G720 Drug-induced myopathy: Secondary | ICD-10-CM | POA: Diagnosis not present

## 2023-11-21 DIAGNOSIS — E1151 Type 2 diabetes mellitus with diabetic peripheral angiopathy without gangrene: Secondary | ICD-10-CM | POA: Diagnosis not present

## 2023-11-21 DIAGNOSIS — I739 Peripheral vascular disease, unspecified: Secondary | ICD-10-CM | POA: Diagnosis not present

## 2023-11-21 DIAGNOSIS — I10 Essential (primary) hypertension: Secondary | ICD-10-CM | POA: Diagnosis not present

## 2023-11-21 DIAGNOSIS — I700 Atherosclerosis of aorta: Secondary | ICD-10-CM | POA: Diagnosis not present

## 2023-11-21 DIAGNOSIS — K5090 Crohn's disease, unspecified, without complications: Secondary | ICD-10-CM | POA: Diagnosis not present

## 2023-11-21 DIAGNOSIS — Z0000 Encounter for general adult medical examination without abnormal findings: Secondary | ICD-10-CM | POA: Diagnosis not present

## 2023-11-21 DIAGNOSIS — T466X5A Adverse effect of antihyperlipidemic and antiarteriosclerotic drugs, initial encounter: Secondary | ICD-10-CM | POA: Diagnosis not present

## 2023-12-25 ENCOUNTER — Ambulatory Visit (HOSPITAL_BASED_OUTPATIENT_CLINIC_OR_DEPARTMENT_OTHER)
Admission: RE | Admit: 2023-12-25 | Discharge: 2023-12-25 | Disposition: A | Discharge status: Home / Self Care | Payer: Medicare PPO | Source: Ambulatory Visit | Attending: Internal Medicine | Admitting: Internal Medicine

## 2023-12-25 ENCOUNTER — Other Ambulatory Visit (HOSPITAL_BASED_OUTPATIENT_CLINIC_OR_DEPARTMENT_OTHER): Payer: Self-pay | Admitting: Internal Medicine

## 2023-12-25 DIAGNOSIS — R1011 Right upper quadrant pain: Secondary | ICD-10-CM | POA: Insufficient documentation

## 2023-12-25 DIAGNOSIS — E1129 Type 2 diabetes mellitus with other diabetic kidney complication: Secondary | ICD-10-CM | POA: Diagnosis not present

## 2023-12-25 DIAGNOSIS — I129 Hypertensive chronic kidney disease with stage 1 through stage 4 chronic kidney disease, or unspecified chronic kidney disease: Secondary | ICD-10-CM | POA: Diagnosis not present

## 2023-12-25 DIAGNOSIS — N858 Other specified noninflammatory disorders of uterus: Secondary | ICD-10-CM | POA: Diagnosis not present

## 2023-12-25 DIAGNOSIS — N281 Cyst of kidney, acquired: Secondary | ICD-10-CM | POA: Diagnosis not present

## 2023-12-25 DIAGNOSIS — R0781 Pleurodynia: Secondary | ICD-10-CM | POA: Diagnosis not present

## 2023-12-25 LAB — POCT I-STAT CREATININE: Creatinine, Ser: 1.5 mg/dL — ABNORMAL HIGH (ref 0.44–1.00)

## 2023-12-25 MED ORDER — IOHEXOL 300 MG/ML  SOLN
100.0000 mL | Freq: Once | INTRAMUSCULAR | Status: AC | PRN
  Administered 2023-12-25: 65 mL via INTRAVENOUS

## 2024-01-06 ENCOUNTER — Other Ambulatory Visit: Payer: Self-pay

## 2024-01-06 ENCOUNTER — Emergency Department (HOSPITAL_COMMUNITY): Payer: Medicare PPO

## 2024-01-06 ENCOUNTER — Emergency Department (HOSPITAL_COMMUNITY)
Admission: EM | Admit: 2024-01-06 | Discharge: 2024-01-06 | Disposition: A | Discharge status: Home / Self Care | Payer: Medicare PPO | Attending: Emergency Medicine | Admitting: Emergency Medicine

## 2024-01-06 DIAGNOSIS — M542 Cervicalgia: Secondary | ICD-10-CM | POA: Diagnosis not present

## 2024-01-06 DIAGNOSIS — Z981 Arthrodesis status: Secondary | ICD-10-CM | POA: Diagnosis not present

## 2024-01-06 DIAGNOSIS — Z79899 Other long term (current) drug therapy: Secondary | ICD-10-CM | POA: Diagnosis not present

## 2024-01-06 DIAGNOSIS — I10 Essential (primary) hypertension: Secondary | ICD-10-CM | POA: Diagnosis not present

## 2024-01-06 DIAGNOSIS — Z01818 Encounter for other preprocedural examination: Secondary | ICD-10-CM | POA: Diagnosis not present

## 2024-01-06 DIAGNOSIS — R519 Headache, unspecified: Secondary | ICD-10-CM | POA: Diagnosis not present

## 2024-01-06 DIAGNOSIS — I6782 Cerebral ischemia: Secondary | ICD-10-CM | POA: Diagnosis not present

## 2024-01-06 MED ORDER — METHOCARBAMOL 500 MG PO TABS
500.0000 mg | ORAL_TABLET | Freq: Once | ORAL | Status: AC
  Administered 2024-01-06: 500 mg via ORAL
  Filled 2024-01-06: qty 1

## 2024-01-06 MED ORDER — ACETAMINOPHEN 500 MG PO TABS
1000.0000 mg | ORAL_TABLET | Freq: Once | ORAL | Status: AC
  Administered 2024-01-06: 1000 mg via ORAL
  Filled 2024-01-06: qty 2

## 2024-01-06 MED ORDER — METHOCARBAMOL 500 MG PO TABS: 500.0000 mg | ORAL_TABLET | Freq: Two times a day (BID) | ORAL | 0 refills | Status: AC | PRN

## 2024-01-06 MED ORDER — LIDOCAINE 5 % EX PTCH
1.0000 | MEDICATED_PATCH | CUTANEOUS | Status: DC
  Administered 2024-01-06: 1 via TRANSDERMAL
  Filled 2024-01-06: qty 1

## 2024-01-06 NOTE — ED Triage Notes (Signed)
Patient to ED by POV with c/o headache x1week. She states she think it is a reaction from her ABX, Cipro Flagyl and Doxycycline. She c/o tightness and pressure to back of head she denies N/V or blurred vision. Denies injury to cause head discomfort .

## 2024-01-06 NOTE — ED Notes (Signed)
Patient transported to CT

## 2024-01-06 NOTE — Discharge Instructions (Signed)
You were seen today for headache, body aches.  Your CT scan was reassuring.  I suspect this is due to a muscular strain.  Recommend you take the prescribed muscle laxer as well as Tylenol and Motrin.  Do not drive while taking muscle relaxer as it may make you drowsy.  If you develop fever, worsening pain, or any other new concerning symptoms you should return to the ED.

## 2024-01-06 NOTE — ED Provider Notes (Signed)
[name]   CSN: [id number] Arrival date & time: 01/06/24  1554     History  Chief Complaint  Patient presents with   Headache    Alexis Ramos is a 73 y.o. female.   Headache 73 year old female history of GERD, hypertension, hyperlipidemia, Crohn's presenting for headache and multiple concerns.  She states she recently saw her doctor in early January and was put on doxycycline for sinusitis.  She finished this.  Shortly after this she had some belly pain, her PCP reportedly did some CT scans of her chest and abdomen, and she was eventually put on Cipro and Flagyl which she finished about a week ago.  For last 4 to 5 days she states she has had headache, body aches, left knee pain, and some pain over the lateral aspect of her neck and her shoulders and back.  Headache was not sudden onset.  Left-sided and goes into the left trapezius.  Mild frontal headache as well.  No fevers or chills as far she is aware.  No chest pain or shortness of breath or abdominal pain.  She is otherwise been at her baseline health.  She is concerned this could be a reaction to the doxycycline or Cipro she was on recently.  She is not currently on antibiotics.  She states she had a similar reaction to Cipro with headache, neck pain, knee pain, body aches several years ago.     Home Medications Prior to Admission medications   Medication Sig Start Date End Date Taking? Authorizing Provider  methocarbamol (ROBAXIN) 500 MG tablet Take 1 tablet (500 mg total) by mouth 2 (two) times daily as needed for muscle spasms. 01/06/24  Yes Laurence Spates, MD  amLODipine (NORVASC) 5 MG tablet Take 1 tablet (5 mg total) by mouth daily. Patient taking differently: Take 5 mg by mouth every evening. 05/14/21   Glade Lloyd, MD  cyanocobalamin (,VITAMIN B-12,) 1000 MCG/ML injection Inject 1,500 mcg into the muscle every 21 ( twenty-one) days. 02/19/21   [provider]  fenofibrate 54 MG tablet Take 54 mg by mouth every other day. In the morning. 02/10/21   [provider]  HYDROcodone-acetaminophen (NORCO/VICODIN) 5-325 MG tablet Take 1-2 tablets by mouth every 4 (four) hours as needed (pain). 07/02/22   Jadene Pierini, MD  losartan (COZAAR) 100 MG tablet Take 1 tablet (100 mg total) by mouth daily. 05/14/21   Glade Lloyd, MD  metoprolol succinate (TOPROL-XL) 100 MG 24 hr tablet Take 50 mg by mouth 2 (two) times daily.    [provider]  Multiple Vitamins-Minerals (PRESERVISION AREDS) CAPS Take 1 capsule by mouth 2 (two) times daily.    [provider]  omeprazole (PRILOSEC) 20 MG capsule Take 20 mg by mouth daily before breakfast. 05/11/19   [provider]  pioglitazone (ACTOS) 15 MG tablet Take 15 mg by mouth in the morning. 04/05/21   [provider]  Probiotic Product (PROBIOTIC PO) Take 1 capsule by mouth daily as needed (with antibiotic regimen.).    [provider]  triamcinolone (NASACORT) 55 MCG/ACT AERO nasal inhaler Place 1 spray into the nose daily as needed (sinus issues.).    [provider]  venlafaxine (EFFEXOR) 75 MG tablet Take 150 mg by mouth 2 (two) times daily with a meal. 05/16/16   [provider]  Vitamin E 400 units TABS Take 400 Units by mouth 2 (two) times daily.  [provider]      Allergies    Nsaids and Penicillins    Review of Systems   Review of Systems  Neurological:  Positive for headaches.  Review of systems completed and notable as per HPI.  ROS otherwise negative.   Physical Exam Updated Vital Signs BP (!) 162/77   Pulse 81   Temp 98.4 F (36.9 C) (Oral)   Resp 18   Ht 4\' 11"  (1.499 m)   Wt 50.8 kg   SpO2 98%   BMI 22.62 kg/m  Physical Exam Vitals and nursing note reviewed.  Constitutional:      General: She is not in acute distress.    Appearance: She is well-developed. She is not ill-appearing.  HENT:      Head: Normocephalic and atraumatic.     Mouth/Throat:     Mouth: Mucous membranes are moist.     Pharynx: Oropharynx is clear.  Eyes:     Extraocular Movements: Extraocular movements intact.     Conjunctiva/sclera: Conjunctivae normal.     Pupils: Pupils are equal, round, and reactive to light.  Cardiovascular:     Rate and Rhythm: Normal rate and regular rhythm.     Pulses: Normal pulses.     Heart sounds: Normal heart sounds. No murmur heard. Pulmonary:     Effort: Pulmonary effort is normal. No respiratory distress.     Breath sounds: Normal breath sounds.  Abdominal:     Palpations: Abdomen is soft.     Tenderness: There is no abdominal tenderness.  Musculoskeletal:        General: No swelling.     Cervical back: Neck supple.     Comments: She has muscular tenderness over the left cervical paraspinal muscles as well as the bilateral thoracic paraspinal muscles.  No skin changes.  She has some slight paraspinal pain particular in the left side of her neck when she moves her head although she has good range of motion of her neck she is not meningitic.  Knee is nontender, full range of motion of bilateral knees without pain.  No significant swelling.  No erythema or warmth.  Distal pulses intact.  Skin:    General: Skin is warm and dry.     Capillary Refill: Capillary refill takes less than 2 seconds.  Neurological:     General: No focal deficit present.     Mental Status: She is alert and oriented to person, place, and time. Mental status is at baseline.     Cranial Nerves: No cranial nerve deficit.     Sensory: No sensory deficit.     Motor: No weakness.  Psychiatric:        Mood and Affect: Mood normal.     ED Results / Procedures / Treatments   Labs (all labs ordered are listed, but only abnormal results are displayed) Labs Reviewed - No data to display  EKG EKG Interpretation Date/Time:  Saturday January 06 2024 16:05:17 EST Ventricular Rate:  85 PR  Interval:  134 QRS Duration:  81 QT Interval:  367 QTC Calculation: 437 R Axis:   19  Text Interpretation: Sinus rhythm Atrial premature complexes Abnormal R-wave progression, early transition Confirmed by Fulton Reek [phone]) on 01/06/2024 9:25:56 PM  Radiology CT Cervical Spine Wo Contrast Result Date: 01/06/2024 CLINICAL DATA:  Neck pain, acute, prior cervical surgery EXAM: CT CERVICAL SPINE WITHOUT CONTRAST TECHNIQUE: Multidetector CT imaging of the cervical spine was performed without intravenous contrast. Multiplanar CT image reconstructions were  also generated. RADIATION DOSE REDUCTION: This exam was performed according to the departmental dose-optimization program which includes automated exposure control, adjustment of the mA and/or kV according to patient size and/or use of iterative reconstruction technique. COMPARISON:  CT cervical spine 02/08/2021 FINDINGS: Alignment: Postsurgical straightening of normal lordosis. No listhesis. Skull base and vertebrae: Anterior fusion hardware from C3 through C7 with interbody spacers. The hardware is intact. No fracture. No primary bone lesion or focal pathologic process. Degenerative pannus at C1-C2. Soft tissues and spinal canal: No prevertebral fluid or swelling. No visible canal hematoma. Disc levels: Postsurgical change with interbody spacers C3-C4 through C6-C7. No high-grade canal or bony neural foraminal stenosis. Upper chest: No acute or unexpected findings. Other: None. IMPRESSION: Anterior fusion hardware C3 through C7 without hardware complication. No acute findings. Electronically Signed   By: Narda Rutherford M.D.   On: 01/06/2024 21:40   CT Head Wo Contrast Result Date: 01/06/2024 CLINICAL DATA:  New onset headaches EXAM: CT HEAD WITHOUT CONTRAST TECHNIQUE: Contiguous axial images were obtained from the base of the skull through the vertex without intravenous contrast. RADIATION DOSE REDUCTION: This exam was performed according to the  departmental dose-optimization program which includes automated exposure control, adjustment of the mA and/or kV according to patient size and/or use of iterative reconstruction technique. COMPARISON:  None Available. FINDINGS: Brain: No evidence of acute infarction, hemorrhage, hydrocephalus, extra-axial collection or mass lesion/mass effect. Chronic white matter ischemic changes and mild atrophic changes are noted. Vascular: No hyperdense vessel or unexpected calcification. Skull: Normal. Negative for fracture or focal lesion. Sinuses/Orbits: No acute finding. Other: None. IMPRESSION: Chronic atrophic and ischemic changes without acute abnormality. Electronically Signed   By: Alcide Clever M.D.   On: 01/06/2024 21:07    Procedures Procedures    Medications Ordered in ED Medications  lidocaine (LIDODERM) 5 % 1 patch (1 patch Transdermal Patch Applied 01/06/24 2135)  acetaminophen (TYLENOL) tablet 1,000 mg (1,000 mg Oral Given 01/06/24 2011)  methocarbamol (ROBAXIN) tablet 500 mg (500 mg Oral Given 01/06/24 2142)    ED Course/ Medical Decision Making/ A&P                                 Medical Decision Making Amount and/or Complexity of Data Reviewed Radiology: ordered.  Risk Prescription drug management.   Medical Decision Making:   DARSHA ZUMSTEIN is a 73 y.o. female who presented to the ED today with headache, muscle soreness.  Normal neurologic exam, no signs of CVA.  She is already obtain CT head.  She also has some pain into her neck and reports prior cervical surgery although she is unsure of what.  Ultane CT of the cervical spine, no trauma.  She is hemodynamically stable and afebrile.  She is recently on antibiotics for sinusitis, but is currently not on any antibiotics.  She is not febrile, she is not meningitic, and she endorses some bodyaches and muscular pain over her back.  I think her pain is more musculoskeletal have low suspicion for CNS infection at this time.  She states  she has similar reaction to Cipro a couple years ago, which resolved on its own.  She again at this time had knee swelling, headache, neck pain which resolved on its own.   Patient placed on continuous vitals and telemetry monitoring while in ED which was reviewed periodically.  Reviewed and confirmed nursing documentation for past medical history, family history, social  history.  Reassessment and Plan:   CT head and cervical spine reviewed, no signs of acute pathology.  On reassessment after muscle relaxer and Tylenol she is feeling significantly better.  She has good range of motion of her neck.  She is afebrile.  Headache resolved.  I have low suspicion for CNS infection.  I suspect this is more muscular, her pain even with movement of her neck is over the left trapezius, and she is diffusely tender over her muscles in her back and neck.  I think this is likely muscle spasm.  Will discharge with Robaxin, give her strict return precautions for fever, worsening pain.  She was comfortable with this plan.  She is neuro intact, no signs of radiculopathy or spinal cord compression.   Patient's presentation is most consistent with acute complicated illness / injury requiring diagnostic workup.           Final Clinical Impression(s) / ED Diagnoses Final diagnoses:  Acute nonintractable headache, unspecified headache type    Rx / DC Orders ED Discharge Orders          Ordered    methocarbamol (ROBAXIN) 500 MG tablet  2 times daily PRN        01/06/24 2224              Laurence Spates, MD 01/06/24 2259

## 2024-01-06 NOTE — ED Notes (Addendum)
Patient transported to CT and returned to hallway

## 2024-01-09 DIAGNOSIS — I129 Hypertensive chronic kidney disease with stage 1 through stage 4 chronic kidney disease, or unspecified chronic kidney disease: Secondary | ICD-10-CM | POA: Diagnosis not present

## 2024-01-09 DIAGNOSIS — D72825 Bandemia: Secondary | ICD-10-CM | POA: Diagnosis not present

## 2024-01-09 DIAGNOSIS — R1011 Right upper quadrant pain: Secondary | ICD-10-CM | POA: Diagnosis not present

## 2024-01-09 DIAGNOSIS — T7840XA Allergy, unspecified, initial encounter: Secondary | ICD-10-CM | POA: Diagnosis not present

## 2024-01-09 DIAGNOSIS — M25562 Pain in left knee: Secondary | ICD-10-CM | POA: Diagnosis not present

## 2024-02-16 DIAGNOSIS — D485 Neoplasm of uncertain behavior of skin: Secondary | ICD-10-CM | POA: Diagnosis not present

## 2024-02-16 DIAGNOSIS — Z86006 Personal history of melanoma in-situ: Secondary | ICD-10-CM | POA: Diagnosis not present

## 2024-02-16 DIAGNOSIS — Z872 Personal history of diseases of the skin and subcutaneous tissue: Secondary | ICD-10-CM | POA: Diagnosis not present

## 2024-02-16 DIAGNOSIS — L820 Inflamed seborrheic keratosis: Secondary | ICD-10-CM | POA: Diagnosis not present

## 2024-02-16 DIAGNOSIS — Z08 Encounter for follow-up examination after completed treatment for malignant neoplasm: Secondary | ICD-10-CM | POA: Diagnosis not present

## 2024-02-16 DIAGNOSIS — L578 Other skin changes due to chronic exposure to nonionizing radiation: Secondary | ICD-10-CM | POA: Diagnosis not present

## 2024-02-16 DIAGNOSIS — Z85828 Personal history of other malignant neoplasm of skin: Secondary | ICD-10-CM | POA: Diagnosis not present

## 2024-02-16 DIAGNOSIS — D235 Other benign neoplasm of skin of trunk: Secondary | ICD-10-CM | POA: Diagnosis not present

## 2024-03-29 ENCOUNTER — Emergency Department (HOSPITAL_COMMUNITY)
Admission: EM | Admit: 2024-03-29 | Discharge: 2024-03-29 | Disposition: A | Discharge status: Home / Self Care | Payer: Worker's Compensation | Attending: Emergency Medicine | Admitting: Emergency Medicine

## 2024-03-29 ENCOUNTER — Other Ambulatory Visit: Payer: Self-pay

## 2024-03-29 ENCOUNTER — Emergency Department (HOSPITAL_COMMUNITY): Payer: Worker's Compensation

## 2024-03-29 ENCOUNTER — Encounter (HOSPITAL_COMMUNITY): Payer: Self-pay

## 2024-03-29 DIAGNOSIS — Y990 Civilian activity done for income or pay: Secondary | ICD-10-CM | POA: Diagnosis not present

## 2024-03-29 DIAGNOSIS — M62838 Other muscle spasm: Secondary | ICD-10-CM | POA: Diagnosis not present

## 2024-03-29 DIAGNOSIS — I6782 Cerebral ischemia: Secondary | ICD-10-CM | POA: Diagnosis not present

## 2024-03-29 DIAGNOSIS — S61512A Laceration without foreign body of left wrist, initial encounter: Secondary | ICD-10-CM | POA: Diagnosis not present

## 2024-03-29 DIAGNOSIS — M542 Cervicalgia: Secondary | ICD-10-CM | POA: Diagnosis not present

## 2024-03-29 DIAGNOSIS — S0990XA Unspecified injury of head, initial encounter: Secondary | ICD-10-CM | POA: Insufficient documentation

## 2024-03-29 DIAGNOSIS — W208XXA Other cause of strike by thrown, projected or falling object, initial encounter: Secondary | ICD-10-CM | POA: Insufficient documentation

## 2024-03-29 DIAGNOSIS — S6992XA Unspecified injury of left wrist, hand and finger(s), initial encounter: Secondary | ICD-10-CM | POA: Diagnosis present

## 2024-03-29 DIAGNOSIS — T148XXA Other injury of unspecified body region, initial encounter: Secondary | ICD-10-CM

## 2024-03-29 DIAGNOSIS — S199XXA Unspecified injury of neck, initial encounter: Secondary | ICD-10-CM | POA: Diagnosis not present

## 2024-03-29 DIAGNOSIS — R9089 Other abnormal findings on diagnostic imaging of central nervous system: Secondary | ICD-10-CM | POA: Diagnosis not present

## 2024-03-29 MED ORDER — CYCLOBENZAPRINE HCL 5 MG PO TABS: 5.0000 mg | ORAL_TABLET | Freq: Three times a day (TID) | ORAL | 0 refills | Status: AC | PRN

## 2024-03-29 MED ORDER — ACETAMINOPHEN 325 MG PO TABS
650.0000 mg | ORAL_TABLET | Freq: Once | ORAL | Status: AC
  Administered 2024-03-29: 650 mg via ORAL
  Filled 2024-03-29: qty 2

## 2024-03-29 NOTE — ED Provider Notes (Signed)
 [place] EMERGENCY DEPARTMENT AT [facility] Provider Note   CSN: [id number] Arrival date & time: 03/29/24  1819     History  Chief Complaint  Patient presents with   Head Injury    Alexis Ramos is a 73 y.o. female.  Patient here after head injury.  Bunch of pots and trace fell on top of her head.  She did lose consciousness.  She now blood thinners.  She had a small skin tear to her left wrist.  Which is not having any pain other than her head and her neck.  Denies any weakness numbness tingling.  She has been ambulatory.  Nothing is made it worse or better.  This occurred at work.  The history is provided by the patient.       Home Medications Prior to Admission medications   Medication Sig Start Date End Date Taking? Authorizing Provider  cyclobenzaprine  (FLEXERIL ) 5 MG tablet Take 1 tablet (5 mg total) by mouth 3 (three) times daily as needed for up to 20 doses for muscle spasms. 03/29/24  Yes Maquita Sandoval, DO  amLODipine  (NORVASC ) 5 MG tablet Take 1 tablet (5 mg total) by mouth daily. Patient taking differently: Take 5 mg by mouth every evening. 05/14/21   Audria Leather, MD  cyanocobalamin  (,VITAMIN B-12,) 1000 MCG/ML injection Inject 1,500 mcg into the muscle every 21 ( twenty-one) days. 02/19/21   [provider]  fenofibrate  54 MG tablet Take 54 mg by mouth every other day. In the morning. 02/10/21   [provider]  HYDROcodone -acetaminophen  (NORCO/VICODIN) 5-325 MG tablet Take 1-2 tablets by mouth every 4 (four) hours as needed (pain). 07/02/22   Cannon Champion, MD  losartan  (COZAAR ) 100 MG tablet Take 1 tablet (100 mg total) by mouth daily. 05/14/21   Audria Leather, MD  methocarbamol  (ROBAXIN ) 500 MG tablet Take 1 tablet (500 mg total) by mouth 2 (two) times daily as needed for muscle spasms. 01/06/24   Davis, Jonathon H, MD  metoprolol  succinate (TOPROL -XL) 100 MG 24 hr tablet Take 50 mg by mouth 2 (two) times daily.    [provider]  Multiple Vitamins-Minerals (PRESERVISION AREDS) CAPS Take 1 capsule by mouth 2 (two) times daily.    [provider]  omeprazole (PRILOSEC) 20 MG capsule Take 20 mg by mouth daily before breakfast. 05/11/19   [provider]  pioglitazone  (ACTOS ) 15 MG tablet Take 15 mg by mouth in the morning. 04/05/21   [provider]  Probiotic Product (PROBIOTIC PO) Take 1 capsule by mouth daily as needed (with antibiotic regimen.).    [provider]  triamcinolone  (NASACORT ) 55 MCG/ACT AERO nasal inhaler Place 1 spray into the nose daily as needed (sinus issues.).    [provider]  venlafaxine  (EFFEXOR ) 75 MG tablet Take 150 mg by mouth 2 (two) times daily with a meal. 05/16/16   [provider]  Vitamin E  400 units TABS Take 400 Units by mouth 2 (two) times daily.    [provider]      Allergies    Nsaids, Penicillins, and Doxycycline    Review of Systems   Review of Systems  Physical Exam Updated Vital Signs BP 137/74 (BP Location: Left Arm)   Pulse 72   Temp 98.1 F (36.7 C) (Oral)   Resp 16   Ht 4\' 11"  (1.499 m)   Wt 51.7 kg   SpO2 95%   BMI 23.03 kg/m  Physical Exam Vitals and nursing  note reviewed.  Constitutional:      General: She is not in acute distress.    Appearance: She is well-developed. She is not ill-appearing.  HENT:     Head:     Comments: No obvious head hematoma or laceration    Nose: Nose normal.     Mouth/Throat:     Mouth: Mucous membranes are moist.  Eyes:     Extraocular Movements: Extraocular movements intact.     Conjunctiva/sclera: Conjunctivae normal.     Pupils: Pupils are equal, round, and reactive to light.  Cardiovascular:     Rate and Rhythm: Normal rate and regular rhythm.     Pulses: Normal pulses.     Heart sounds: Normal heart sounds. No murmur heard. Pulmonary:     Effort: Pulmonary effort is normal. No respiratory distress.     Breath sounds: Normal breath  sounds.  Abdominal:     General: Abdomen is flat.     Palpations: Abdomen is soft.     Tenderness: There is no abdominal tenderness.  Musculoskeletal:        General: No swelling or tenderness. Normal range of motion.     Cervical back: Normal range of motion and neck supple. No tenderness.     Comments: No midline spinal tenderness, no extremity tenderness  Skin:    General: Skin is warm and dry.     Capillary Refill: Capillary refill takes less than 2 seconds.     Comments: Small skin tear to the left wrist  Neurological:     General: No focal deficit present.     Mental Status: She is alert and oriented to person, place, and time.     Cranial Nerves: No cranial nerve deficit.     Sensory: No sensory deficit.     Motor: No weakness.     Coordination: Coordination normal.     Comments: Normal strength and sensation, 5+ out of 5 strength throughout, normal gait normal speech  Psychiatric:        Mood and Affect: Mood normal.     ED Results / Procedures / Treatments   Labs (all labs ordered are listed, but only abnormal results are displayed) Labs Reviewed - No data to display  EKG None  Radiology CT HEAD WO CONTRAST ( ) Result Date: 03/29/2024 CLINICAL DATA:  Poly trauma, blunt, pots fell onto head at work. EXAM: CT HEAD WITHOUT CONTRAST CT CERVICAL SPINE WITHOUT CONTRAST TECHNIQUE: Multidetector CT imaging of the head and cervical spine was performed following the standard protocol without intravenous contrast. Multiplanar CT image reconstructions of the cervical spine were also generated. RADIATION DOSE REDUCTION: This exam was performed according to the departmental dose-optimization program which includes automated exposure control, adjustment of the mA and/or kV according to patient size and/or use of iterative reconstruction technique. COMPARISON:  01/06/2024. FINDINGS: CT HEAD FINDINGS Brain: No acute intracranial hemorrhage. No CT evidence of acute infarct. Nonspecific  hypoattenuation in the periventricular and subcortical white matter favored to reflect chronic microvascular ischemic changes. Mild parenchymal volume loss. No edema, mass effect, or midline shift. The basilar cisterns are patent. Ventricles: Prominence of the ventricles suggesting underlying parenchymal volume loss. Vascular: Atherosclerotic calcifications of the carotid siphons and intracranial vertebral arteries. No hyperdense vessel. Skull: No acute or aggressive finding. Orbits: Orbits are symmetric. Sinuses: Complete opacification of the right frontal sinus and near complete opacification of the right anterior ethmoid air cells. Other: Mastoid air cells are clear. CT CERVICAL SPINE FINDINGS Alignment: Straightening of  the normal cervical lordosis. No significant listhesis. No facet subluxation or dislocation. Skull base and vertebrae: No compression fracture or displaced fracture in the cervical spine. No suspicious osseous lesion. Similar anterior cervical fusion from C3-C7. Hardware is intact. Soft tissues and spinal canal: No prevertebral fluid or swelling. No visible canal hematoma. Disc levels: Postsurgical changes as above. No high-grade osseous spinal canal stenosis. Facet arthrosis at multiple levels. Foraminal stenosis is most pronounced on the right at C3-4. Upper chest: Negative. Other: None. IMPRESSION: No CT evidence of acute intracranial abnormality. No acute fracture or traumatic malalignment of the cervical spine. Similar anterior cervical fusion from C3-C7. Chronic microvascular ischemic changes and mild parenchymal volume loss. Increased paranasal sinus disease in the right frontal and ethmoid sinuses. Electronically Signed   By: Denny Flack M.D.   On: 03/29/2024 20:10   CT Cervical Spine Wo Contrast Result Date: 03/29/2024 CLINICAL DATA:  Poly trauma, blunt, pots fell onto head at work. EXAM: CT HEAD WITHOUT CONTRAST CT CERVICAL SPINE WITHOUT CONTRAST TECHNIQUE: Multidetector CT imaging  of the head and cervical spine was performed following the standard protocol without intravenous contrast. Multiplanar CT image reconstructions of the cervical spine were also generated. RADIATION DOSE REDUCTION: This exam was performed according to the departmental dose-optimization program which includes automated exposure control, adjustment of the mA and/or kV according to patient size and/or use of iterative reconstruction technique. COMPARISON:  01/06/2024. FINDINGS: CT HEAD FINDINGS Brain: No acute intracranial hemorrhage. No CT evidence of acute infarct. Nonspecific hypoattenuation in the periventricular and subcortical white matter favored to reflect chronic microvascular ischemic changes. Mild parenchymal volume loss. No edema, mass effect, or midline shift. The basilar cisterns are patent. Ventricles: Prominence of the ventricles suggesting underlying parenchymal volume loss. Vascular: Atherosclerotic calcifications of the carotid siphons and intracranial vertebral arteries. No hyperdense vessel. Skull: No acute or aggressive finding. Orbits: Orbits are symmetric. Sinuses: Complete opacification of the right frontal sinus and near complete opacification of the right anterior ethmoid air cells. Other: Mastoid air cells are clear. CT CERVICAL SPINE FINDINGS Alignment: Straightening of the normal cervical lordosis. No significant listhesis. No facet subluxation or dislocation. Skull base and vertebrae: No compression fracture or displaced fracture in the cervical spine. No suspicious osseous lesion. Similar anterior cervical fusion from C3-C7. Hardware is intact. Soft tissues and spinal canal: No prevertebral fluid or swelling. No visible canal hematoma. Disc levels: Postsurgical changes as above. No high-grade osseous spinal canal stenosis. Facet arthrosis at multiple levels. Foraminal stenosis is most pronounced on the right at C3-4. Upper chest: Negative. Other: None. IMPRESSION: No CT evidence of acute  intracranial abnormality. No acute fracture or traumatic malalignment of the cervical spine. Similar anterior cervical fusion from C3-C7. Chronic microvascular ischemic changes and mild parenchymal volume loss. Increased paranasal sinus disease in the right frontal and ethmoid sinuses. Electronically Signed   By: Denny Flack M.D.   On: 03/29/2024 20:10    Procedures Procedures    Medications Ordered in ED Medications  acetaminophen  (TYLENOL ) tablet 650 mg (650 mg Oral Given 03/29/24 1844)    ED Course/ Medical Decision Making/ A&P                                 Medical Decision Making Amount and/or Complexity of Data Reviewed Radiology: ordered.  Risk OTC drugs. Prescription drug management.   ALIZEY NOREN is here with headache neck pain after head trauma.  She  was at work when multiple pots and trays fell and hit her head.  She has small skin tear to her left wrist but no tenderness.  Overall we will get a CT scan of the head and neck.  I have no concern for other traumatic injury elsewhere.  She is neurologically intact.  She is not on blood thinners.  No obvious hematoma to the scalp.  Will give Tylenol  reevaluate.  Wound was washed out Band-Aid placed over skin tear.  CT scans unremarkable per radiology report.  Discharge.  Will prescribe Flexeril .  Recommend Tylenol  ibuprofen ice rest.  Understands return precautions.  This chart was dictated using voice recognition software.  Despite best efforts to proofread,  errors can occur which can change the documentation meaning.         Final Clinical Impression(s) / ED Diagnoses Final diagnoses:  Minor head injury, initial encounter  Muscle spasm  Abrasion    Rx / DC Orders ED Discharge Orders          Ordered    cyclobenzaprine  (FLEXERIL ) 5 MG tablet  3 times daily PRN        03/29/24 2023              Lowery Rue, DO 03/29/24 2024

## 2024-03-29 NOTE — ED Triage Notes (Signed)
 Pots fell on pt head at work at 1630, no LOC, no lacerations, no hematomas, pt is not on blood thinners. Skin tear to left wrist.

## 2024-03-29 NOTE — Discharge Instructions (Signed)
 Use Flexeril  as prescribed.  Medication is sedating so please be careful with its use.  Do not mix alcohol drugs or dangerous activities.  Recommend Tylenol  ibuprofen as well for pain if you can take those medications.

## 2024-05-21 DIAGNOSIS — I10 Essential (primary) hypertension: Secondary | ICD-10-CM | POA: Diagnosis not present

## 2024-05-21 DIAGNOSIS — E1151 Type 2 diabetes mellitus with diabetic peripheral angiopathy without gangrene: Secondary | ICD-10-CM | POA: Diagnosis not present

## 2024-05-21 DIAGNOSIS — E785 Hyperlipidemia, unspecified: Secondary | ICD-10-CM | POA: Diagnosis not present

## 2024-05-21 DIAGNOSIS — T466X5A Adverse effect of antihyperlipidemic and antiarteriosclerotic drugs, initial encounter: Secondary | ICD-10-CM | POA: Diagnosis not present

## 2024-05-21 DIAGNOSIS — K5090 Crohn's disease, unspecified, without complications: Secondary | ICD-10-CM | POA: Diagnosis not present

## 2024-05-21 DIAGNOSIS — G720 Drug-induced myopathy: Secondary | ICD-10-CM | POA: Diagnosis not present

## 2024-05-21 DIAGNOSIS — R928 Other abnormal and inconclusive findings on diagnostic imaging of breast: Secondary | ICD-10-CM | POA: Diagnosis not present

## 2024-05-21 DIAGNOSIS — M79641 Pain in right hand: Secondary | ICD-10-CM | POA: Diagnosis not present

## 2024-05-21 DIAGNOSIS — I739 Peripheral vascular disease, unspecified: Secondary | ICD-10-CM | POA: Diagnosis not present

## 2024-05-22 ENCOUNTER — Other Ambulatory Visit: Payer: Self-pay | Admitting: Internal Medicine

## 2024-05-22 DIAGNOSIS — Z1231 Encounter for screening mammogram for malignant neoplasm of breast: Secondary | ICD-10-CM

## 2024-07-08 DIAGNOSIS — M25561 Pain in right knee: Secondary | ICD-10-CM | POA: Diagnosis not present

## 2024-07-08 DIAGNOSIS — M79642 Pain in left hand: Secondary | ICD-10-CM | POA: Diagnosis not present

## 2024-07-08 DIAGNOSIS — M25562 Pain in left knee: Secondary | ICD-10-CM | POA: Diagnosis not present

## 2024-07-08 DIAGNOSIS — N1831 Chronic kidney disease, stage 3a: Secondary | ICD-10-CM | POA: Diagnosis not present

## 2024-07-08 DIAGNOSIS — M1990 Unspecified osteoarthritis, unspecified site: Secondary | ICD-10-CM | POA: Diagnosis not present

## 2024-07-08 DIAGNOSIS — M549 Dorsalgia, unspecified: Secondary | ICD-10-CM | POA: Diagnosis not present

## 2024-07-08 DIAGNOSIS — M79673 Pain in unspecified foot: Secondary | ICD-10-CM | POA: Diagnosis not present

## 2024-07-08 DIAGNOSIS — M542 Cervicalgia: Secondary | ICD-10-CM | POA: Diagnosis not present

## 2024-07-08 DIAGNOSIS — M25569 Pain in unspecified knee: Secondary | ICD-10-CM | POA: Diagnosis not present

## 2024-07-08 DIAGNOSIS — K5090 Crohn's disease, unspecified, without complications: Secondary | ICD-10-CM | POA: Diagnosis not present

## 2024-07-08 DIAGNOSIS — M79641 Pain in right hand: Secondary | ICD-10-CM | POA: Diagnosis not present

## 2024-07-08 DIAGNOSIS — M2550 Pain in unspecified joint: Secondary | ICD-10-CM | POA: Diagnosis not present

## 2024-07-08 DIAGNOSIS — M79643 Pain in unspecified hand: Secondary | ICD-10-CM | POA: Diagnosis not present

## 2024-08-19 DIAGNOSIS — M79673 Pain in unspecified foot: Secondary | ICD-10-CM | POA: Diagnosis not present

## 2024-08-19 DIAGNOSIS — M549 Dorsalgia, unspecified: Secondary | ICD-10-CM | POA: Diagnosis not present

## 2024-08-19 DIAGNOSIS — M25569 Pain in unspecified knee: Secondary | ICD-10-CM | POA: Diagnosis not present

## 2024-08-19 DIAGNOSIS — K5090 Crohn's disease, unspecified, without complications: Secondary | ICD-10-CM | POA: Diagnosis not present

## 2024-08-19 DIAGNOSIS — N1831 Chronic kidney disease, stage 3a: Secondary | ICD-10-CM | POA: Diagnosis not present

## 2024-08-19 DIAGNOSIS — M1990 Unspecified osteoarthritis, unspecified site: Secondary | ICD-10-CM | POA: Diagnosis not present

## 2024-08-19 DIAGNOSIS — M542 Cervicalgia: Secondary | ICD-10-CM | POA: Diagnosis not present

## 2024-08-19 DIAGNOSIS — M79643 Pain in unspecified hand: Secondary | ICD-10-CM | POA: Diagnosis not present

## 2024-09-10 DIAGNOSIS — K760 Fatty (change of) liver, not elsewhere classified: Secondary | ICD-10-CM | POA: Diagnosis not present

## 2024-09-10 DIAGNOSIS — G720 Drug-induced myopathy: Secondary | ICD-10-CM | POA: Diagnosis not present

## 2024-09-10 DIAGNOSIS — N1832 Chronic kidney disease, stage 3b: Secondary | ICD-10-CM | POA: Diagnosis not present

## 2024-09-10 DIAGNOSIS — I129 Hypertensive chronic kidney disease with stage 1 through stage 4 chronic kidney disease, or unspecified chronic kidney disease: Secondary | ICD-10-CM | POA: Diagnosis not present

## 2024-09-10 DIAGNOSIS — E1129 Type 2 diabetes mellitus with other diabetic kidney complication: Secondary | ICD-10-CM | POA: Diagnosis not present

## 2024-09-10 DIAGNOSIS — E785 Hyperlipidemia, unspecified: Secondary | ICD-10-CM | POA: Diagnosis not present

## 2024-09-16 DIAGNOSIS — E1151 Type 2 diabetes mellitus with diabetic peripheral angiopathy without gangrene: Secondary | ICD-10-CM | POA: Diagnosis not present

## 2024-10-03 DIAGNOSIS — M542 Cervicalgia: Secondary | ICD-10-CM | POA: Diagnosis not present

## 2024-10-09 DIAGNOSIS — H353132 Nonexudative age-related macular degeneration, bilateral, intermediate dry stage: Secondary | ICD-10-CM | POA: Diagnosis not present

## 2024-10-09 DIAGNOSIS — H2513 Age-related nuclear cataract, bilateral: Secondary | ICD-10-CM | POA: Diagnosis not present

## 2024-10-09 DIAGNOSIS — H52203 Unspecified astigmatism, bilateral: Secondary | ICD-10-CM | POA: Diagnosis not present

## 2024-10-09 DIAGNOSIS — E119 Type 2 diabetes mellitus without complications: Secondary | ICD-10-CM | POA: Diagnosis not present

## 2024-10-23 ENCOUNTER — Other Ambulatory Visit (HOSPITAL_COMMUNITY): Payer: Self-pay

## 2024-10-23 MED ORDER — EMPAGLIFLOZIN 25 MG PO TABS
25.0000 mg | ORAL_TABLET | Freq: Every day | ORAL | 1 refills | Status: AC
  Filled 2024-10-23 – 2024-10-26 (×2): qty 90, 90d supply, fill #0
  Filled 2025-01-17: qty 90, 90d supply, fill #1

## 2024-10-26 ENCOUNTER — Other Ambulatory Visit (HOSPITAL_COMMUNITY): Payer: Self-pay

## 2024-12-26 ENCOUNTER — Other Ambulatory Visit: Payer: Self-pay | Admitting: Neurosurgery

## 2024-12-26 ENCOUNTER — Encounter: Payer: Self-pay | Admitting: Neurosurgery

## 2024-12-26 DIAGNOSIS — M542 Cervicalgia: Secondary | ICD-10-CM

## 2025-01-07 ENCOUNTER — Ambulatory Visit
Admission: RE | Admit: 2025-01-07 | Discharge: 2025-01-07 | Disposition: A | Source: Ambulatory Visit | Attending: Neurosurgery | Admitting: Neurosurgery

## 2025-01-07 DIAGNOSIS — M542 Cervicalgia: Secondary | ICD-10-CM

## 2025-01-17 ENCOUNTER — Other Ambulatory Visit (HOSPITAL_COMMUNITY): Payer: Self-pay

## 2025-01-17 ENCOUNTER — Other Ambulatory Visit: Payer: Self-pay
